# Patient Record
Sex: Female | Born: 1935 | Race: Black or African American | Hispanic: No | Smoking: Never smoker
Health system: Southern US, Community
[De-identification: ages and names within clinical notes are randomized; demographics above are authoritative.]

## PROBLEM LIST (undated history)

## (undated) DIAGNOSIS — G4700 Insomnia, unspecified: Secondary | ICD-10-CM

## (undated) DIAGNOSIS — M545 Low back pain, unspecified: Secondary | ICD-10-CM

## (undated) DIAGNOSIS — M797 Fibromyalgia: Secondary | ICD-10-CM

## (undated) DIAGNOSIS — R1310 Dysphagia, unspecified: Secondary | ICD-10-CM

## (undated) DIAGNOSIS — L280 Lichen simplex chronicus: Secondary | ICD-10-CM

## (undated) DIAGNOSIS — K5730 Diverticulosis of large intestine without perforation or abscess without bleeding: Secondary | ICD-10-CM

## (undated) DIAGNOSIS — R3589 Other polyuria: Secondary | ICD-10-CM

## (undated) DIAGNOSIS — R202 Paresthesia of skin: Secondary | ICD-10-CM

## (undated) DIAGNOSIS — E538 Deficiency of other specified B group vitamins: Secondary | ICD-10-CM

## (undated) DIAGNOSIS — R358 Other polyuria: Secondary | ICD-10-CM

## (undated) DIAGNOSIS — Z86010 Personal history of colonic polyps: Secondary | ICD-10-CM

## (undated) DIAGNOSIS — Z9889 Other specified postprocedural states: Secondary | ICD-10-CM

## (undated) DIAGNOSIS — K449 Diaphragmatic hernia without obstruction or gangrene: Secondary | ICD-10-CM

## (undated) DIAGNOSIS — K219 Gastro-esophageal reflux disease without esophagitis: Secondary | ICD-10-CM

## (undated) DIAGNOSIS — R634 Abnormal weight loss: Secondary | ICD-10-CM

## (undated) DIAGNOSIS — E785 Hyperlipidemia, unspecified: Secondary | ICD-10-CM

## (undated) DIAGNOSIS — I4510 Unspecified right bundle-branch block: Secondary | ICD-10-CM

## (undated) DIAGNOSIS — M810 Age-related osteoporosis without current pathological fracture: Secondary | ICD-10-CM

## (undated) DIAGNOSIS — I10 Essential (primary) hypertension: Secondary | ICD-10-CM

## (undated) DIAGNOSIS — Z8679 Personal history of other diseases of the circulatory system: Secondary | ICD-10-CM

## (undated) DIAGNOSIS — I517 Cardiomegaly: Secondary | ICD-10-CM

## (undated) DIAGNOSIS — R002 Palpitations: Secondary | ICD-10-CM

## (undated) DIAGNOSIS — K8020 Calculus of gallbladder without cholecystitis without obstruction: Secondary | ICD-10-CM

## (undated) DIAGNOSIS — F411 Generalized anxiety disorder: Secondary | ICD-10-CM

## (undated) DIAGNOSIS — M8580 Other specified disorders of bone density and structure, unspecified site: Secondary | ICD-10-CM

## (undated) DIAGNOSIS — K869 Disease of pancreas, unspecified: Secondary | ICD-10-CM

## (undated) DIAGNOSIS — M129 Arthropathy, unspecified: Secondary | ICD-10-CM

## (undated) HISTORY — DX: Low back pain, unspecified: M54.50

## (undated) HISTORY — DX: Diverticulosis of large intestine without perforation or abscess without bleeding: K57.30

## (undated) HISTORY — DX: Diaphragmatic hernia without obstruction or gangrene: K44.9

## (undated) HISTORY — DX: Fibromyalgia: M79.7

## (undated) HISTORY — DX: Personal history of colonic polyps: Z86.010

## (undated) HISTORY — DX: Essential (primary) hypertension: I10

## (undated) HISTORY — PX: CHOLECYSTECTOMY: SHX55

## (undated) HISTORY — DX: Dysphagia, unspecified: R13.10

## (undated) HISTORY — DX: Calculus of gallbladder without cholecystitis without obstruction: K80.20

## (undated) HISTORY — DX: Personal history of other diseases of the circulatory system: Z86.79

## (undated) HISTORY — DX: Paresthesia of skin: R20.2

## (undated) HISTORY — DX: Gastro-esophageal reflux disease without esophagitis: K21.9

## (undated) HISTORY — DX: Abnormal weight loss: R63.4

## (undated) HISTORY — DX: Lichen simplex chronicus: L28.0

## (undated) HISTORY — DX: Generalized anxiety disorder: F41.1

## (undated) HISTORY — DX: Other polyuria: R35.8

## (undated) HISTORY — PX: ABDOMINAL HYSTERECTOMY: SHX81

## (undated) HISTORY — PX: SPINE SURGERY: SHX786

## (undated) HISTORY — DX: Low back pain: M54.5

## (undated) HISTORY — DX: Other polyuria: R35.89

## (undated) HISTORY — PX: ESOPHAGOGASTRODUODENOSCOPY: SHX1529

## (undated) HISTORY — DX: Deficiency of other specified B group vitamins: E53.8

## (undated) HISTORY — DX: Cardiomegaly: I51.7

## (undated) HISTORY — PX: APPENDECTOMY: SHX54

## (undated) HISTORY — DX: Other specified disorders of bone density and structure, unspecified site: M85.80

## (undated) HISTORY — DX: Other specified postprocedural states: Z98.890

## (undated) HISTORY — DX: Unspecified right bundle-branch block: I45.10

## (undated) HISTORY — DX: Arthropathy, unspecified: M12.9

## (undated) HISTORY — PX: OOPHORECTOMY: SHX86

## (undated) HISTORY — DX: Hyperlipidemia, unspecified: E78.5

## (undated) HISTORY — DX: Disease of pancreas, unspecified: K86.9

## (undated) HISTORY — DX: Palpitations: R00.2

## (undated) HISTORY — DX: Age-related osteoporosis without current pathological fracture: M81.0

## (undated) HISTORY — DX: Insomnia, unspecified: G47.00

---

## 1998-03-27 ENCOUNTER — Other Ambulatory Visit: Admission: RE | Admit: 1998-03-27 | Discharge: 1998-03-27 | Payer: Self-pay | Admitting: Obstetrics and Gynecology

## 1999-05-10 ENCOUNTER — Other Ambulatory Visit: Admission: RE | Admit: 1999-05-10 | Discharge: 1999-05-10 | Payer: Self-pay | Admitting: Obstetrics and Gynecology

## 1999-10-17 ENCOUNTER — Encounter: Payer: Self-pay | Admitting: Obstetrics and Gynecology

## 1999-10-17 ENCOUNTER — Encounter: Admission: RE | Admit: 1999-10-17 | Discharge: 1999-10-17 | Payer: Self-pay | Admitting: General Surgery

## 2000-06-02 ENCOUNTER — Other Ambulatory Visit: Admission: RE | Admit: 2000-06-02 | Discharge: 2000-06-02 | Payer: Self-pay | Admitting: Obstetrics and Gynecology

## 2000-10-20 ENCOUNTER — Encounter: Admission: RE | Admit: 2000-10-20 | Discharge: 2000-10-20 | Payer: Self-pay | Admitting: Obstetrics and Gynecology

## 2000-10-20 ENCOUNTER — Encounter: Payer: Self-pay | Admitting: Obstetrics and Gynecology

## 2001-05-20 ENCOUNTER — Encounter: Payer: Self-pay | Admitting: Otolaryngology

## 2001-05-20 ENCOUNTER — Encounter: Admission: RE | Admit: 2001-05-20 | Discharge: 2001-05-20 | Payer: Self-pay | Admitting: Otolaryngology

## 2001-06-28 ENCOUNTER — Other Ambulatory Visit: Admission: RE | Admit: 2001-06-28 | Discharge: 2001-06-28 | Payer: Self-pay | Admitting: Obstetrics and Gynecology

## 2001-11-05 ENCOUNTER — Encounter: Payer: Self-pay | Admitting: Obstetrics and Gynecology

## 2001-11-05 ENCOUNTER — Encounter: Admission: RE | Admit: 2001-11-05 | Discharge: 2001-11-05 | Payer: Self-pay | Admitting: Obstetrics and Gynecology

## 2002-11-22 ENCOUNTER — Encounter: Payer: Self-pay | Admitting: Gastroenterology

## 2002-11-22 ENCOUNTER — Ambulatory Visit (HOSPITAL_COMMUNITY): Admission: RE | Admit: 2002-11-22 | Discharge: 2002-11-22 | Payer: Self-pay | Admitting: Gastroenterology

## 2002-12-09 ENCOUNTER — Encounter: Admission: RE | Admit: 2002-12-09 | Discharge: 2002-12-09 | Payer: Self-pay | Admitting: Internal Medicine

## 2002-12-09 ENCOUNTER — Encounter: Payer: Self-pay | Admitting: Internal Medicine

## 2004-01-04 ENCOUNTER — Encounter: Admission: RE | Admit: 2004-01-04 | Discharge: 2004-01-04 | Payer: Self-pay | Admitting: Internal Medicine

## 2004-12-17 ENCOUNTER — Ambulatory Visit: Payer: Self-pay | Admitting: Internal Medicine

## 2005-01-29 ENCOUNTER — Ambulatory Visit: Payer: Self-pay | Admitting: Internal Medicine

## 2005-02-04 ENCOUNTER — Encounter: Admission: RE | Admit: 2005-02-04 | Discharge: 2005-02-04 | Payer: Self-pay | Admitting: Internal Medicine

## 2005-02-04 ENCOUNTER — Other Ambulatory Visit: Admission: RE | Admit: 2005-02-04 | Discharge: 2005-02-04 | Payer: Self-pay | Admitting: Gynecology

## 2005-02-25 ENCOUNTER — Ambulatory Visit: Payer: Self-pay | Admitting: Internal Medicine

## 2005-04-28 ENCOUNTER — Ambulatory Visit: Payer: Self-pay | Admitting: Internal Medicine

## 2005-05-22 ENCOUNTER — Emergency Department (HOSPITAL_COMMUNITY): Admission: EM | Admit: 2005-05-22 | Discharge: 2005-05-23 | Payer: Self-pay | Admitting: *Deleted

## 2005-05-29 ENCOUNTER — Ambulatory Visit: Payer: Self-pay | Admitting: Internal Medicine

## 2005-06-24 ENCOUNTER — Inpatient Hospital Stay (HOSPITAL_COMMUNITY): Admission: AD | Admit: 2005-06-24 | Discharge: 2005-06-27 | Payer: Self-pay | Admitting: Internal Medicine

## 2005-06-24 ENCOUNTER — Ambulatory Visit: Payer: Self-pay | Admitting: Internal Medicine

## 2005-06-26 ENCOUNTER — Encounter (INDEPENDENT_AMBULATORY_CARE_PROVIDER_SITE_OTHER): Payer: Self-pay | Admitting: Specialist

## 2005-07-07 ENCOUNTER — Ambulatory Visit: Payer: Self-pay | Admitting: Internal Medicine

## 2005-10-07 ENCOUNTER — Ambulatory Visit: Payer: Self-pay | Admitting: Internal Medicine

## 2006-01-19 ENCOUNTER — Ambulatory Visit: Payer: Self-pay | Admitting: Internal Medicine

## 2006-02-06 ENCOUNTER — Encounter: Admission: RE | Admit: 2006-02-06 | Discharge: 2006-02-06 | Payer: Self-pay | Admitting: Gynecology

## 2006-03-02 ENCOUNTER — Encounter: Admission: RE | Admit: 2006-03-02 | Discharge: 2006-03-02 | Payer: Self-pay | Admitting: Gynecology

## 2006-04-20 ENCOUNTER — Ambulatory Visit: Payer: Self-pay | Admitting: Internal Medicine

## 2006-05-29 ENCOUNTER — Ambulatory Visit: Payer: Self-pay | Admitting: Internal Medicine

## 2006-07-29 ENCOUNTER — Ambulatory Visit: Payer: Self-pay | Admitting: Internal Medicine

## 2006-08-03 ENCOUNTER — Ambulatory Visit: Payer: Self-pay | Admitting: Gastroenterology

## 2006-08-04 ENCOUNTER — Ambulatory Visit: Payer: Self-pay | Admitting: Gastroenterology

## 2006-10-13 ENCOUNTER — Ambulatory Visit: Payer: Self-pay | Admitting: Internal Medicine

## 2006-10-13 LAB — CONVERTED CEMR LAB
Basophils Absolute: 0 10*3/uL (ref 0.0–0.1)
Eosinophil percent: 8.5 % — ABNORMAL HIGH (ref 0.0–5.0)
Folate: 13.8 ng/mL
Glucose, Bld: 121 mg/dL — ABNORMAL HIGH (ref 70–99)
Lymphocytes Relative: 24.6 % (ref 12.0–46.0)
MCHC: 33.6 g/dL (ref 30.0–36.0)
Neutro Abs: 3.6 10*3/uL (ref 1.4–7.7)
Neutrophils Relative %: 57.3 % (ref 43.0–77.0)

## 2007-01-11 ENCOUNTER — Ambulatory Visit: Payer: Self-pay | Admitting: Internal Medicine

## 2007-01-11 LAB — CONVERTED CEMR LAB
ALT: 27 units/L (ref 0–40)
AST: 23 units/L (ref 0–37)
Albumin: 3.6 g/dL (ref 3.5–5.2)
Alkaline Phosphatase: 124 units/L — ABNORMAL HIGH (ref 39–117)
Bilirubin, Direct: 0.2 mg/dL (ref 0.0–0.3)
Chol/HDL Ratio, serum: 2.6
Cholesterol: 176 mg/dL (ref 0–200)
Glucose, Bld: 97 mg/dL (ref 70–99)
HDL: 66.9 mg/dL (ref >39.0)
Hgb A1c MFr Bld: 6.3 % — ABNORMAL HIGH (ref 4.6–6.0)
LDL Cholesterol: 96 mg/dL (ref 0–99)
Total Bilirubin: 0.9 mg/dL (ref 0.3–1.2)
Total Protein: 6.6 g/dL (ref 6.0–8.3)
Triglyceride fasting, serum: 66 mg/dL (ref 0–149)
VLDL: 13 mg/dL (ref 0–40)

## 2007-02-23 ENCOUNTER — Encounter: Admission: RE | Admit: 2007-02-23 | Discharge: 2007-02-23 | Payer: Self-pay | Admitting: Gynecology

## 2007-02-23 ENCOUNTER — Other Ambulatory Visit: Admission: RE | Admit: 2007-02-23 | Discharge: 2007-02-23 | Payer: Self-pay | Admitting: Gynecology

## 2007-03-03 ENCOUNTER — Encounter: Admission: RE | Admit: 2007-03-03 | Discharge: 2007-03-03 | Payer: Self-pay | Admitting: Gynecology

## 2007-04-12 ENCOUNTER — Ambulatory Visit: Payer: Self-pay | Admitting: Internal Medicine

## 2007-06-25 ENCOUNTER — Ambulatory Visit: Payer: Self-pay | Admitting: Internal Medicine

## 2007-06-25 LAB — CONVERTED CEMR LAB
Basophils Relative: 1 % (ref 0–1)
HCT: 41.6 % (ref 36.0–46.0)
Hemoglobin: 13.9 g/dL (ref 12.0–15.0)
MCHC: 33.4 g/dL (ref 30.0–36.0)
MCV: 86.3 fL (ref 78.0–100.0)
Monocytes Absolute: 0.6 10*3/uL (ref 0.2–0.7)
RDW: 14.7 % — ABNORMAL HIGH (ref 11.5–14.0)
T3, Free: 2.9 pg/mL (ref 2.3–4.2)

## 2007-08-10 DIAGNOSIS — M899 Disorder of bone, unspecified: Secondary | ICD-10-CM | POA: Insufficient documentation

## 2007-08-10 DIAGNOSIS — IMO0001 Reserved for inherently not codable concepts without codable children: Secondary | ICD-10-CM

## 2007-08-10 DIAGNOSIS — J45909 Unspecified asthma, uncomplicated: Secondary | ICD-10-CM

## 2007-08-10 DIAGNOSIS — M949 Disorder of cartilage, unspecified: Secondary | ICD-10-CM

## 2007-09-02 ENCOUNTER — Encounter: Admission: RE | Admit: 2007-09-02 | Discharge: 2007-09-02 | Payer: Self-pay | Admitting: Gynecology

## 2007-09-02 ENCOUNTER — Ambulatory Visit: Payer: Self-pay | Admitting: Internal Medicine

## 2007-09-02 DIAGNOSIS — E538 Deficiency of other specified B group vitamins: Secondary | ICD-10-CM

## 2007-09-02 DIAGNOSIS — K219 Gastro-esophageal reflux disease without esophagitis: Secondary | ICD-10-CM

## 2007-09-02 HISTORY — DX: Deficiency of other specified B group vitamins: E53.8

## 2007-09-02 HISTORY — DX: Gastro-esophageal reflux disease without esophagitis: K21.9

## 2007-10-18 ENCOUNTER — Telehealth (INDEPENDENT_AMBULATORY_CARE_PROVIDER_SITE_OTHER): Payer: Self-pay | Admitting: *Deleted

## 2007-12-09 ENCOUNTER — Ambulatory Visit: Payer: Self-pay | Admitting: Internal Medicine

## 2007-12-09 DIAGNOSIS — T887XXA Unspecified adverse effect of drug or medicament, initial encounter: Secondary | ICD-10-CM

## 2007-12-09 DIAGNOSIS — E785 Hyperlipidemia, unspecified: Secondary | ICD-10-CM

## 2007-12-09 DIAGNOSIS — M545 Low back pain: Secondary | ICD-10-CM

## 2007-12-09 HISTORY — DX: Hyperlipidemia, unspecified: E78.5

## 2007-12-09 LAB — CONVERTED CEMR LAB
AST: 23 units/L (ref 0–37)
Cholesterol: 169 mg/dL (ref 0–200)
HDL: 58 mg/dL (ref >39.0)
LDL Cholesterol: 103 mg/dL — ABNORMAL HIGH (ref 0–99)
Total Protein: 6.6 g/dL (ref 6.0–8.3)
Triglycerides: 42 mg/dL (ref 0–149)
Vit D, 1,25-Dihydroxy: 32 (ref 30–89)

## 2008-03-03 ENCOUNTER — Ambulatory Visit: Payer: Self-pay | Admitting: Internal Medicine

## 2008-03-03 LAB — CONVERTED CEMR LAB
Cholesterol, target level: 200 mg/dL
HDL goal, serum: 40 mg/dL
LDL Goal: 160 mg/dL

## 2008-03-08 ENCOUNTER — Encounter: Payer: Self-pay | Admitting: Internal Medicine

## 2008-03-08 ENCOUNTER — Encounter: Admission: RE | Admit: 2008-03-08 | Discharge: 2008-03-08 | Payer: Self-pay | Admitting: Gynecology

## 2008-06-02 ENCOUNTER — Ambulatory Visit: Payer: Self-pay | Admitting: Internal Medicine

## 2008-06-02 DIAGNOSIS — R634 Abnormal weight loss: Secondary | ICD-10-CM

## 2008-06-02 DIAGNOSIS — Z860100 Personal history of colon polyps, unspecified: Secondary | ICD-10-CM | POA: Insufficient documentation

## 2008-06-02 HISTORY — DX: Personal history of colon polyps, unspecified: Z86.0100

## 2008-06-02 HISTORY — DX: Abnormal weight loss: R63.4

## 2008-06-02 HISTORY — DX: Personal history of colonic polyps: Z86.010

## 2008-06-02 LAB — CONVERTED CEMR LAB
CEA: 1.1 ng/mL (ref 0.0–5.0)
Chloride: 100 meq/L (ref 96–112)
Potassium: 3.7 meq/L (ref 3.5–5.1)
Sodium: 137 meq/L (ref 135–145)
TSH: 0.72 microintl units/mL (ref 0.35–5.50)

## 2008-08-30 ENCOUNTER — Ambulatory Visit: Payer: Self-pay | Admitting: Internal Medicine

## 2008-08-30 DIAGNOSIS — F411 Generalized anxiety disorder: Secondary | ICD-10-CM | POA: Insufficient documentation

## 2008-09-06 ENCOUNTER — Ambulatory Visit: Payer: Self-pay | Admitting: Internal Medicine

## 2008-09-19 ENCOUNTER — Encounter: Payer: Self-pay | Admitting: Internal Medicine

## 2008-09-19 ENCOUNTER — Ambulatory Visit: Payer: Self-pay | Admitting: Internal Medicine

## 2008-09-22 ENCOUNTER — Encounter: Payer: Self-pay | Admitting: Internal Medicine

## 2008-09-30 ENCOUNTER — Ambulatory Visit: Payer: Self-pay | Admitting: Internal Medicine

## 2008-10-30 ENCOUNTER — Ambulatory Visit: Payer: Self-pay | Admitting: Internal Medicine

## 2009-01-22 ENCOUNTER — Ambulatory Visit: Payer: Self-pay | Admitting: Internal Medicine

## 2009-01-22 LAB — CONVERTED CEMR LAB
ALT: 22 units/L (ref 0–35)
AST: 20 units/L (ref 0–37)
Alkaline Phosphatase: 111 units/L (ref 39–117)
Basophils Absolute: 0 10*3/uL (ref 0.0–0.1)
Basophils Relative: 0.5 % (ref 0.0–3.0)
Bilirubin, Direct: 0.1 mg/dL (ref 0.0–0.3)
Chloride: 110 meq/L (ref 96–112)
Cholesterol: 156 mg/dL (ref 0–200)
Eosinophils Absolute: 0.4 10*3/uL (ref 0.0–0.7)
Eosinophils Relative: 8.5 % — ABNORMAL HIGH (ref 0.0–5.0)
GFR calc Af Amer: 156 mL/min
HCT: 38.1 % (ref 36.0–46.0)
HDL: 66.3 mg/dL (ref >39.0)
Hemoglobin: 13 g/dL (ref 12.0–15.0)
MCHC: 34 g/dL (ref 30.0–36.0)
Monocytes Absolute: 0.5 10*3/uL (ref 0.1–1.0)
Monocytes Relative: 11.2 % (ref 3.0–12.0)
Neutro Abs: 2 10*3/uL (ref 1.4–7.7)
Potassium: 4.3 meq/L (ref 3.5–5.1)
Sodium: 142 meq/L (ref 135–145)
Triglycerides: 38 mg/dL (ref 0–149)

## 2009-02-08 ENCOUNTER — Ambulatory Visit: Payer: Self-pay | Admitting: Internal Medicine

## 2009-03-15 ENCOUNTER — Encounter: Admission: RE | Admit: 2009-03-15 | Discharge: 2009-03-15 | Payer: Self-pay | Admitting: Gynecology

## 2009-03-15 ENCOUNTER — Encounter: Payer: Self-pay | Admitting: Internal Medicine

## 2009-05-17 ENCOUNTER — Ambulatory Visit: Payer: Self-pay | Admitting: Internal Medicine

## 2009-05-17 DIAGNOSIS — M810 Age-related osteoporosis without current pathological fracture: Secondary | ICD-10-CM | POA: Insufficient documentation

## 2009-05-17 HISTORY — DX: Age-related osteoporosis without current pathological fracture: M81.0

## 2009-09-13 ENCOUNTER — Ambulatory Visit: Payer: Self-pay | Admitting: Internal Medicine

## 2009-09-13 LAB — CONVERTED CEMR LAB
Albumin: 3.5 g/dL (ref 3.5–5.2)
Alkaline Phosphatase: 115 units/L (ref 39–117)
Cholesterol: 145 mg/dL (ref 0–200)
HDL: 55.8 mg/dL (ref >39.00)
TSH: 1.69 microintl units/mL (ref 0.35–5.50)
Total Bilirubin: 0.9 mg/dL (ref 0.3–1.2)
VLDL: 9 mg/dL (ref 0.0–40.0)

## 2009-09-27 ENCOUNTER — Ambulatory Visit: Payer: Self-pay | Admitting: Internal Medicine

## 2009-10-05 ENCOUNTER — Encounter: Admission: RE | Admit: 2009-10-05 | Discharge: 2009-10-05 | Payer: Self-pay | Admitting: Podiatry

## 2010-01-17 ENCOUNTER — Ambulatory Visit: Payer: Self-pay | Admitting: Internal Medicine

## 2010-01-17 DIAGNOSIS — L280 Lichen simplex chronicus: Secondary | ICD-10-CM

## 2010-03-19 ENCOUNTER — Encounter: Admission: RE | Admit: 2010-03-19 | Discharge: 2010-03-19 | Payer: Self-pay | Admitting: Gynecology

## 2010-04-18 ENCOUNTER — Ambulatory Visit: Payer: Self-pay | Admitting: Internal Medicine

## 2010-04-18 LAB — CONVERTED CEMR LAB
TSH: 1.53 microintl units/mL (ref 0.35–5.50)
Vit D, 25-Hydroxy: 49 ng/mL (ref 30–89)
Vitamin B-12: >1500 pg/mL — ABNORMAL HIGH (ref 211–911)

## 2010-08-12 ENCOUNTER — Ambulatory Visit: Payer: Self-pay | Admitting: Family Medicine

## 2010-08-12 DIAGNOSIS — R319 Hematuria, unspecified: Secondary | ICD-10-CM

## 2010-08-12 DIAGNOSIS — G4700 Insomnia, unspecified: Secondary | ICD-10-CM

## 2010-08-12 DIAGNOSIS — I517 Cardiomegaly: Secondary | ICD-10-CM

## 2010-08-12 DIAGNOSIS — R358 Other polyuria: Secondary | ICD-10-CM

## 2010-08-12 DIAGNOSIS — R209 Unspecified disturbances of skin sensation: Secondary | ICD-10-CM

## 2010-08-12 DIAGNOSIS — Z8679 Personal history of other diseases of the circulatory system: Secondary | ICD-10-CM | POA: Insufficient documentation

## 2010-08-12 HISTORY — DX: Personal history of other diseases of the circulatory system: Z86.79

## 2010-08-12 HISTORY — DX: Cardiomegaly: I51.7

## 2010-08-13 ENCOUNTER — Encounter: Payer: Self-pay | Admitting: Internal Medicine

## 2010-08-13 LAB — CONVERTED CEMR LAB
ALT: 18 units/L (ref 0–35)
Basophils Relative: 0.9 % (ref 0.0–3.0)
Bilirubin, Direct: 0.1 mg/dL (ref 0.0–0.3)
CO2: 27 meq/L (ref 19–32)
Calcium: 8.9 mg/dL (ref 8.4–10.5)
Chloride: 105 meq/L (ref 96–112)
Creatinine, Ser: 0.6 mg/dL (ref 0.4–1.2)
Lymphocytes Relative: 27.3 % (ref 12.0–46.0)
Lymphs Abs: 1.3 10*3/uL (ref 0.7–4.0)
Monocytes Absolute: 0.5 10*3/uL (ref 0.1–1.0)
Neutro Abs: 2.5 10*3/uL (ref 1.4–7.7)
Neutrophils Relative %: 51.5 % (ref 43.0–77.0)
Phosphorus: 2.7 mg/dL (ref 2.3–4.6)
Platelets: 296 10*3/uL (ref 150.0–400.0)
RDW: 14.2 % (ref 11.5–14.6)
TSH: 0.97 microintl units/mL (ref 0.35–5.50)

## 2010-09-03 DIAGNOSIS — M129 Arthropathy, unspecified: Secondary | ICD-10-CM

## 2010-09-03 DIAGNOSIS — K869 Disease of pancreas, unspecified: Secondary | ICD-10-CM | POA: Insufficient documentation

## 2010-09-03 DIAGNOSIS — R1310 Dysphagia, unspecified: Secondary | ICD-10-CM

## 2010-09-03 DIAGNOSIS — R002 Palpitations: Secondary | ICD-10-CM

## 2010-09-03 DIAGNOSIS — K5730 Diverticulosis of large intestine without perforation or abscess without bleeding: Secondary | ICD-10-CM

## 2010-09-03 DIAGNOSIS — K8020 Calculus of gallbladder without cholecystitis without obstruction: Secondary | ICD-10-CM | POA: Insufficient documentation

## 2010-09-03 HISTORY — DX: Diverticulosis of large intestine without perforation or abscess without bleeding: K57.30

## 2010-09-03 HISTORY — DX: Arthropathy, unspecified: M12.9

## 2010-09-03 HISTORY — DX: Disease of pancreas, unspecified: K86.9

## 2010-09-03 HISTORY — DX: Dysphagia, unspecified: R13.10

## 2010-09-03 HISTORY — DX: Palpitations: R00.2

## 2010-09-03 HISTORY — DX: Calculus of gallbladder without cholecystitis without obstruction: K80.20

## 2010-09-05 ENCOUNTER — Ambulatory Visit: Payer: Self-pay | Admitting: Cardiovascular Disease

## 2010-09-05 DIAGNOSIS — I4510 Unspecified right bundle-branch block: Secondary | ICD-10-CM

## 2010-09-05 HISTORY — DX: Unspecified right bundle-branch block: I45.10

## 2010-09-10 ENCOUNTER — Telehealth (INDEPENDENT_AMBULATORY_CARE_PROVIDER_SITE_OTHER): Payer: Self-pay | Admitting: *Deleted

## 2010-09-23 ENCOUNTER — Ambulatory Visit (HOSPITAL_COMMUNITY): Admission: RE | Admit: 2010-09-23 | Discharge: 2010-09-23 | Payer: Self-pay | Admitting: Cardiovascular Disease

## 2010-09-23 ENCOUNTER — Ambulatory Visit: Payer: Self-pay | Admitting: Cardiovascular Disease

## 2010-09-23 ENCOUNTER — Ambulatory Visit: Payer: Self-pay | Admitting: Internal Medicine

## 2010-09-23 ENCOUNTER — Encounter: Payer: Self-pay | Admitting: Cardiovascular Disease

## 2010-09-23 ENCOUNTER — Ambulatory Visit: Payer: Self-pay

## 2010-09-25 ENCOUNTER — Ambulatory Visit: Payer: Self-pay | Admitting: Internal Medicine

## 2010-10-07 ENCOUNTER — Telehealth (INDEPENDENT_AMBULATORY_CARE_PROVIDER_SITE_OTHER): Payer: Self-pay | Admitting: *Deleted

## 2010-10-08 ENCOUNTER — Telehealth: Payer: Self-pay | Admitting: Cardiovascular Disease

## 2010-10-14 ENCOUNTER — Encounter: Payer: Self-pay | Admitting: Cardiovascular Disease

## 2010-10-14 ENCOUNTER — Telehealth: Payer: Self-pay | Admitting: Cardiovascular Disease

## 2010-10-29 ENCOUNTER — Ambulatory Visit: Payer: Self-pay | Admitting: Internal Medicine

## 2010-10-29 DIAGNOSIS — I10 Essential (primary) hypertension: Secondary | ICD-10-CM | POA: Insufficient documentation

## 2010-10-29 HISTORY — DX: Essential (primary) hypertension: I10

## 2010-11-04 ENCOUNTER — Ambulatory Visit: Payer: Self-pay | Admitting: Internal Medicine

## 2011-01-07 ENCOUNTER — Encounter: Payer: Self-pay | Admitting: *Deleted

## 2011-01-28 NOTE — Progress Notes (Signed)
Summary: appointments  Phone Note Other Incoming   Summary of Call: Received call from pt who states she is returning call to office.  Pt reports she spoke with Harriett Sine earlier today regarding new medication and she has no questions about that.  Thinks call may have been about appointment with Dr. Johney Frame. I let pt know appt with Dr. Johney Frame is scheduled for November 7,2011 at 4:00. Pt asking if she needs to keep previously scheduled appointment with Dr.Nishan for October 28, 2010. I checked with Stanton Kidney who said pt does not need to keep this appoinment unless she has additonal questions for Dr. Eden Emms.  Pt without additonal questions and would like to cancel appointment with Dr.Nishan.  Appointment for October 28, 2010 canceled.

## 2011-01-28 NOTE — Miscellaneous (Signed)
  Clinical Lists Changes  Medications: Added new medication of LOPRESSOR 50 MG TABS (METOPROLOL TARTRATE) take 1/2 tablet twice per day - Signed Rx of LOPRESSOR 50 MG TABS (METOPROLOL TARTRATE) take 1/2 tablet twice per day;  #30 x 6;  Signed;  Entered by: Ledon Snare, RN;  Authorized by: Colon Branch, MD, [facility];  Method used: Electronically to [facility] [address] [place], [place], [place]  [address], Ph: [phone], Fax: [phone]    Prescriptions: LOPRESSOR 50 MG TABS (METOPROLOL TARTRATE) take 1/2 tablet twice per day  #30 x 6   Entered by:   Ledon Snare, RN   Authorized by:   Colon Branch, MD, [facility]   Signed by:   Ledon Snare, RN on 10/14/2010   Method used:   Electronically to        [facility]  [address] (retail)       [address]       [place], [place]  [address]       Ph: [phone]       Fax: [phone]   RxID:   [id number]

## 2011-01-28 NOTE — Assessment & Plan Note (Signed)
Summary: EP REFERRAL FOR SINUS TACHCARDIA PER LIFE WATCH MONITOR   Visit Type:  Initial Consult Referring Provider:  Dr Eden Emms Primary Provider:  Stacie Glaze MD   History of Present Illness: [name] is a pleasant 75 yo AAF with a h/o palpitations who presents for EP consultation.  She reports episodes abrupt onset and termination of tachypalpitations.  These episodes first began 9/11.  These episodes would last less than several minutes.  She reports that she would take xanax and tachycardia would resolve.  She is unaware of triggers/ precipitants for episodes.  She denies associated CP, SOB, dizziness, presyncope, or syncope.  She was evaluated by Dr Eden Emms and had an event monitor obtained.  This revealed a long RP tachycardia, likely sinus tachycardia.  She was placed on lopressor 25mg  two times a day.  She states that her symptoms have significantly improved with lopressor.  She does not wish for change in therapy at this time.  Current Medications (verified): 1)  Qvar 80 Mcg/act Aers (Beclomethasone Dipropionate) .... Inhale 2 Puff At Bedtime 2)  Magnesium Gluconate 250 Mg  Tabs (Magnesium Gluconate) .... Once Daily 3)  Rhinocort Aqua 32 Mcg/act  Susp (Budesonide (Nasal)) .... Once Daily As Needed 4)  Protonix 40 Mg  Tbec (Pantoprazole Sodium) .... Once Daily As Needed 5)  Mobic 7.5 Mg  Tabs (Meloxicam) .... Once Daily 6)  Caltrate 600+d Plus 600-400 Mg-Unit Tabs (Calcium Carbonate-Vit D-Min) .... One By Mouth Bid 7)  Alprazolam 1 Mg Tabs (Alprazolam) .... One By Mouth Two Times A Day 8)  Premarin 0.625 Mg/gm Crea (Estrogens, Conjugated) 9)  Lopressor 50 Mg Tabs (Metoprolol Tartrate) .... Take 1/2 Tablet Twice Per Day  Allergies: 1)  ! Celebrex (Celecoxib)  Past History:  Past Medical History: Tachypalpitations- long RP tachycardia on event monitor, either sinus tachycardia vs atrial tachycardia HYPERLIPIDEMIA  DIVERTICULAR DISEASE  CHOLELITHIASIS  GALLSTONE PANCREATITIS    ARTHRITIS  DYSPHAGIA UNSPECIFIED  HEMATURIA UNSPECIFIED  ENCOUNTER FOR LONG-TERM USE OF OTHER MEDICATIONS POLYURIA  VENTRICULAR HYPERTROPHY, LEFT  INSOMNIA  PARESTHESIA  LICHEN SIMPLEX CHRONICUS  OSTEOPOROSIS  ANXIETY STATE, UNSPECIFIED  WEIGHT LOSS, ABNORMAL  COLONIC POLYPS, HX OF  UNS ADVRS EFF UNS RX MEDICINAL&BIOLOGICAL SBSTNC  LOW BACK PAIN  VITAMIN B12 DEFICIENCY  GERD  FIBROMYALGIA  OSTEOPENIA  ASTHMA   Past Surgical History: Reviewed history from 09/03/2010 and no changes required. EGD 2007 with HH and esophagitis Cholecystectomy Hysterectomy Oophorectomy Neck surgery, especially cervical neck. appendectomy  Family History: Reviewed history from 09/03/2010 and no changes required.   Dad with a history of prostate cancer.  Mom died in her 8s  secondary to stroke and myocardial infarction.  Social History: Widowed x 11 years.   Never Smoked  She is retired.  She is a widow.  He has six children and lives in [place], [place].  She goes to [employer].  Review of Systems       All systems are reviewed and negative except as listed in the HPI.   Vital Signs:  Patient profile:   75 year old female Height:      65 inches Weight:      146 pounds BMI:     24.38 Pulse rate:   67 / minute BP sitting:   140 / 70  (left arm)  Vitals Entered By: Laurance Flatten CMA (November 04, 2010 4:22 PM)  Physical Exam  General:  Affect appropriate Healthy:  appears stated age HEENT: normal Neck  supple with no adenopathy JVP normal no bruits no thyromegaly Lungs clear with no wheezing and good diaphragmatic motion Heart:  RRR no murmur,rub, gallop or click PMI normal Abdomen: benighn, BS positve, no tenderness, no AAA no bruit.  No HSM or HJR Distal pulses intact with no bruits No edema Neuro non-focal Skin warm and dry     Echocardiogram  Procedure date:  09/23/2010  Findings:        Study Conclusions    - Left ventricle: The  cavity size was normal. Wall thickness was     increased in a pattern of mild LVH. Systolic function was     vigorous. The estimated ejection fraction was in the range of 65%     to 70%. Wall motion was normal; there were no regional wall motion     abnormalities. Doppler parameters are consistent with abnormal     left ventricular relaxation (grade 1 diastolic dysfunction).   - Tricuspid valve: Increased thickening and prominence of tricuspid     annulus.   - Pulmonary arteries: PA peak pressure: 36mm Hg (S).   Transthoracic echocardiography. M-mode, complete 2D, spectral   Doppler, and color Doppler. Height: Height: 165.1cm. Height: 65in.   Weight: Weight: 66.7kg. Weight: 146.7lb. Body mass index: BMI:   24.5kg/m^2. Body surface area: BSA: 1.33m^2. Blood pressure: 150/70.   Patient status: Outpatient. Location: [facility]   --------------------------------------------------------------------   Prepared and Electronically Authenticated by    Dietrich Pates, MD     EKG  Procedure date:  11/04/2010  Findings:      sinus rhythm 67 bpm, PR 152, RBB, LAHB, otherwise normal ekg  Impression & Recommendations:  Problem # 1:  PALPITATIONS (ICD-785.1) The patient has abrupt onset and termination of tachypalpitations.  I have reviewed her recent event monitor which documents a long RP tachycardia.  The majority of these episodes appear to be sinus tachycardia, however, several episodes appear to be quite fast and abrupt and are therefore suggestive of possible atrial tachycardia.  At this point, she feels that her symptoms are well controlled with low dose metoprolol.  She has no interest in ablation. I think that we should continue on the path of beta blocker therapy and lifestyle modification to minimize stress as this is the most benign treatment course.  If her episodes worsen, then I would titrate her beta blocker further. I would not recommend antiarrhythmic medicine or ablation at  this time.  Problem # 2:  ESSENTIAL HYPERTENSION (ICD-401.9) stable no changes Her updated medication list for this problem includes:    Lopressor 50 Mg Tabs (Metoprolol tartrate) .Marland Kitchen... Take 1/2 tablet twice per day  Problem # 3:  RBBB (ICD-426.4) stable and tolerating metoprolol echo reviewed  Patient Instructions: 1)  Your physician wants you to follow-up in: 6 months with Dr Johney Frame  Bonita Quin will receive a reminder letter in the mail two months in advance. If you don't receive a letter, please call our office to schedule the follow-up appointment. 2)  Your physician recommends that you continue on your current medications as directed. Please refer to the Current Medication list given to you today.

## 2011-01-28 NOTE — Assessment & Plan Note (Signed)
Summary: 4 MONTH ROV/NJR   Vital Signs:  Patient profile:   75 year old female Height:      65 inches Weight:      152 pounds BMI:     25.39 Temp:     98.3 degrees F oral Pulse rate:   80 / minute Resp:     14 per minute BP sitting:   140 / 80  (left arm)  Vitals Entered By: Willy Eddy, LPN (January 17, 2010 10:11 AM) CC: roa   CC:  roa.  History of Present Illness: pt feels well has developed a contact dermatitis but off cymbalta her mood is better the xanax helps her anxiety and she is able to get some sleep she had a fall on her hand ( left and has swelling) she is conserned for a fracture GERD is stable on protonix asthma is stable nees sample of meds  Preventive Screening-Counseling & Management  Alcohol-Tobacco     Smoking Status: never     Passive Smoke Exposure: no  Problems Prior to Update: 1)  Osteoporosis  (ICD-733.00) 2)  Anxiety State, Unspecified  (ICD-300.00) 3)  Weight Loss, Abnormal  (ICD-783.21) 4)  Colonic Polyps, Hx of  (ICD-V12.72) 5)  Hyperlipidemia  (ICD-272.4) 6)  Uns Advrs Eff Uns Rx Medicinal&biological Sbstnc  (ICD-995.20) 7)  Low Back Pain  (ICD-724.2) 8)  Vitamin B12 Deficiency  (ICD-266.2) 9)  Gerd  (ICD-530.81) 10)  Fibromyalgia  (ICD-729.1) 11)  Osteopenia  (ICD-733.90) 12)  Asthma  (ICD-493.90)  Medications Prior to Update: 1)  Qvar 80 Mcg/act Aers (Beclomethasone Dipropionate) .... Inhale 2 Puff As Directed Twice A Day 2)  Clonazepam 0.5 Mg Tabs (Clonazepam) .... One By Mouth  Two Times A Day Daily 3)  Magnesium Gluconate 250 Mg  Tabs (Magnesium Gluconate) .... Once Daily 4)  Rhinocort Aqua 32 Mcg/act  Susp (Budesonide (Nasal)) .... Once Daily 5)  Protonix 40 Mg  Tbec (Pantoprazole Sodium) .... Once Daily 6)  Mobic 7.5 Mg  Tabs (Meloxicam) .... Once Daily 7)  Cymbalta 30 Mg  Cpep (Duloxetine Hcl) .... Take 1 Capsule By Mouth Once A Day 8)  Ultram 50 Mg  Tabs (Tramadol Hcl) .... One By Mouth  Every 6 Hours As Needed  Pain 9)  Caltrate 600+d Plus 600-400 Mg-Unit Tabs (Calcium Carbonate-Vit D-Min) .... One By Mouth Bid  Current Medications (verified): 1)  Qvar 80 Mcg/act Aers (Beclomethasone Dipropionate) .... Inhale 2 Puff As Directed Twice A Day 2)  Magnesium Gluconate 250 Mg  Tabs (Magnesium Gluconate) .... Once Daily 3)  Rhinocort Aqua 32 Mcg/act  Susp (Budesonide (Nasal)) .... Once Daily 4)  Protonix 40 Mg  Tbec (Pantoprazole Sodium) .... Once Daily 5)  Mobic 7.5 Mg  Tabs (Meloxicam) .... Once Daily 6)  Ultram 50 Mg  Tabs (Tramadol Hcl) .... One By Mouth  Every 6 Hours As Needed Pain 7)  Caltrate 600+d Plus 600-400 Mg-Unit Tabs (Calcium Carbonate-Vit D-Min) .... One By Mouth Bid 8)  Alprazolam 0.5 Mg Tabs (Alprazolam) .... One By Mouth Q Hs 9)  Amcinonide 0.1 % Crea (Amcinonide) .... Apply To Palm Daily As Needed For Rash  Allergies (verified): 1)  ! Celebrex (Celecoxib)  Past History:  Family History: Last updated: 09/02/2007 longevity  Social History: Last updated: 09/02/2007 Single Never Smoked  Risk Factors: Smoking Status: never (01/17/2010) Passive Smoke Exposure: no (01/17/2010)  Past medical, surgical, family and social histories (including risk factors) reviewed, and no changes noted (except as noted below).  Past Medical  History: Reviewed history from 06/02/2008 and no changes required. Asthma Osteopenia fibromyalgia GERD Low back pain Hyperlipidemia Colonic polyps, hx of  Past Surgical History: Reviewed history from 09/02/2007 and no changes required. EGD 2007 with HH and esophagitis Cholecystectomy Hysterectomy Oophorectomy  Family History: Reviewed history from 09/02/2007 and no changes required. longevity  Social History: Reviewed history from 09/02/2007 and no changes required. Single Never Smoked  Review of Systems  The patient denies anorexia, fever, weight loss, weight gain, vision loss, decreased hearing, hoarseness, chest pain, syncope, dyspnea  on exertion, peripheral edema, prolonged cough, headaches, hemoptysis, abdominal pain, melena, hematochezia, severe indigestion/heartburn, hematuria, incontinence, genital sores, muscle weakness, suspicious skin lesions, transient blindness, difficulty walking, depression, unusual weight change, abnormal bleeding, enlarged lymph nodes, angioedema, and breast masses.    Physical Exam  General:  alert.  lost weight due to diet Head:  Normocephalic and atraumatic without obvious abnormalities. No apparent alopecia or balding. Eyes:  pupils equal and pupils round.   Ears:  R ear normal and L ear normal.   Neck:  No deformities, masses, or tenderness noted. Lungs:  normal respiratory effort and no wheezes.   Heart:  normal rate and regular rhythm.   Abdomen:  soft and non-tender.   Skin:  lichenificnation of palms Psych:  labile affect and moderately anxious.     Impression & Recommendations:  Problem # 1:  ANXIETY STATE, UNSPECIFIED (ICD-300.00)  The following medications were removed from the medication list:    Clonazepam 0.5 Mg Tabs (Clonazepam) ..... One by mouth  two times a day daily    Cymbalta 30 Mg Cpep (Duloxetine hcl) .Marland Kitchen... Take 1 capsule by mouth once a day Her updated medication list for this problem includes:    Alprazolam 0.5 Mg Tabs (Alprazolam) ..... One by mouth q hs  Discussed medication use and relaxation techniques.   Problem # 2:  LICHEN SIMPLEX CHRONICUS (ICD-698.3)  stop washing hand excessively and rubbiing palm use cotton gloves and apply the steriod cream at night  Orders: Prescription Created Electronically [phone])  Problem # 3:  FIBROMYALGIA (ICD-729.1)  Her updated medication list for this problem includes:    Mobic 7.5 Mg Tabs (Meloxicam) ..... Once daily    Ultram 50 Mg Tabs (Tramadol hcl) ..... One by mouth  every 6 hours as needed pain  Problem # 4:  ASTHMA (ICD-493.90) Assessment: Unchanged samples given ( cost is an issue Her updated  medication list for this problem includes:    Qvar 80 Mcg/act Aers (Beclomethasone dipropionate) ..... Inhale 2 puff as directed twice a day  Complete Medication List: 1)  Qvar 80 Mcg/act Aers (Beclomethasone dipropionate) .... Inhale 2 puff as directed twice a day 2)  Magnesium Gluconate 250 Mg Tabs (Magnesium gluconate) .... Once daily 3)  Rhinocort Aqua 32 Mcg/act Susp (Budesonide (nasal)) .... Once daily 4)  Protonix 40 Mg Tbec (Pantoprazole sodium) .... Once daily 5)  Mobic 7.5 Mg Tabs (Meloxicam) .... Once daily 6)  Ultram 50 Mg Tabs (Tramadol hcl) .... One by mouth  every 6 hours as needed pain 7)  Caltrate 600+d Plus 600-400 Mg-unit Tabs (Calcium carbonate-vit d-min) .... One by mouth bid 8)  Alprazolam 0.5 Mg Tabs (Alprazolam) .... One by mouth q hs 9)  Amcinonide 0.1 % Crea (Amcinonide) .... Apply to palm daily as needed for rash  Patient Instructions: 1)  Please schedule a follow-up appointment in 3 months. Prescriptions: AMCINONIDE 0.1 % CREA (AMCINONIDE) apply to palm daily as needed for rash  #30gm  x 3   Entered and Authorized by:   Stacie Glaze MD   Signed by:   Stacie Glaze MD on 01/17/2010   Method used:   Electronically to        [facility]  [address] (retail)       [address]       [place], [place]  [address]       Ph: [phone]       Fax: [phone]   RxID:   [id number] ALPRAZOLAM 0.5 MG TABS (ALPRAZOLAM) one by mouth q HS  #30 x 11   Entered and Authorized by:   Stacie Glaze MD   Signed by:   Stacie Glaze MD on 01/17/2010   Method used:   Print then Give to Patient   RxID:   [id number]

## 2011-01-28 NOTE — Assessment & Plan Note (Signed)
Summary: NP3/PALPS   History of Present Illness: [name] is seen today at the request of Dr Rogelia Rohrer  She has had daily palptations over the last 2 weeks.  No previous heart history.  ? History of murmur.  Indicates they are not related to exertion.  Made worse by caffeine.  ECG 8/15 [facility] NSR with RBBB.  She feels more jittery in general and may be having an anxiety state.  Reviewed lab work form 8/15 and TSH was normal.  No SSCP dyspnea or syncope.  Palpitatons can last minutes and occure daily.  Slight improvement over the last couple of days. No other exacerbating factors other than caffeine.  Occur night or day  Current Problems (verified): 1)  Palpitations  (ICD-785.1) 2)  Heart Murmur, Hx of  (ICD-V12.50) 3)  Hyperlipidemia  (ICD-272.4) 4)  Diverticular Disease  (ICD-562.10) 5)  Cholelithiasis  (ICD-574.20) 6)  Gallstone Pancreatitis  (ICD-577.9) 7)  Arthritis  (ICD-716.90) 8)  Dysphagia Unspecified  (ICD-787.20) 9)  Hematuria Unspecified  (ICD-599.70) 10)  Encounter For Long-term Use of Other Medications  (ICD-V58.69) 11)  Polyuria  (ICD-788.42) 12)  Ventricular Hypertrophy, Left  (ICD-429.3) 13)  Insomnia  (ICD-780.52) 14)  Paresthesia  (ICD-782.0) 15)  Lichen Simplex Chronicus  (ICD-698.3) 16)  Osteoporosis  (ICD-733.00) 17)  Anxiety State, Unspecified  (ICD-300.00) 18)  Weight Loss, Abnormal  (ICD-783.21) 19)  Colonic Polyps, Hx of  (ICD-V12.72) 20)  Uns Advrs Eff Uns Rx Medicinal&biological Sbstnc  (ICD-995.20) 21)  Low Back Pain  (ICD-724.2) 22)  Vitamin B12 Deficiency  (ICD-266.2) 23)  Gerd  (ICD-530.81) 24)  Fibromyalgia  (ICD-729.1) 25)  Osteopenia  (ICD-733.90) 26)  Asthma  (ICD-493.90)  Current Medications (verified): 1)  Qvar 80 Mcg/act Aers (Beclomethasone Dipropionate) .... Inhale 2 Puff At Bedtime 2)  Magnesium Gluconate 250 Mg  Tabs (Magnesium Gluconate) .... Once Daily 3)  Rhinocort Aqua 32 Mcg/act  Susp (Budesonide (Nasal)) .... Once Daily As  Needed 4)  Protonix 40 Mg  Tbec (Pantoprazole Sodium) .... Once Daily As Needed 5)  Mobic 7.5 Mg  Tabs (Meloxicam) .... Once Daily 6)  Caltrate 600+d Plus 600-400 Mg-Unit Tabs (Calcium Carbonate-Vit D-Min) .... One By Mouth Bid 7)  Alprazolam 1 Mg Tabs (Alprazolam) .... One By Mouth Q Hs 8)  Premarin 0.625 Mg/gm Crea (Estrogens, Conjugated) 9)  Lorazepam 0.5 Mg Tabs (Lorazepam) .Marland Kitchen.. 1-2 Tabs By Mouth At Bedtime As Needed Anxiety or Insomnia in Place of Alprazolam 10)  Cipro 250 Mg Tabs (Ciprofloxacin Hcl) .Marland Kitchen.. 1 Tab By Mouth Two Times A Day X5 Days  Allergies (verified): 1)  ! Celebrex (Celecoxib)  Past History:  Past Medical History: Last updated: 09/03/2010 palpitation HEART MURMUR, HX OF HYPERLIPIDEMIA  DIVERTICULAR DISEASE  CHOLELITHIASIS  GALLSTONE PANCREATITIS  ARTHRITIS  DYSPHAGIA UNSPECIFIED  HEMATURIA UNSPECIFIED  ENCOUNTER FOR LONG-TERM USE OF OTHER MEDICATIONS POLYURIA  VENTRICULAR HYPERTROPHY, LEFT  INSOMNIA  PARESTHESIA  LICHEN SIMPLEX CHRONICUS  OSTEOPOROSIS  ANXIETY STATE, UNSPECIFIED  WEIGHT LOSS, ABNORMAL  COLONIC POLYPS, HX OF  UNS ADVRS EFF UNS RX MEDICINAL&BIOLOGICAL SBSTNC  LOW BACK PAIN  VITAMIN B12 DEFICIENCY  GERD  FIBROMYALGIA  OSTEOPENIA  ASTHMA   Past Surgical History: Last updated: 09/03/2010 EGD 2007 with HH and esophagitis Cholecystectomy Hysterectomy Oophorectomy Neck surgery, especially cervical neck. appendectomy  Family History: Last updated: 09/03/2010   Dad with a history of prostate cancer.  Mom died in her 30s  secondary to stroke and myocardial infarction.  Social History: Last updated: 09/03/2010 Single Never Smoked  She is retired.  She is a widow.  He has six children and  lives in [place], [place].  Review of Systems       Denies fever, malais, weight loss, blurry vision, decreased visual acuity, cough, sputum, SOB, hemoptysis, pleuritic pain,  heartburn, abdominal pain, melena, lower extremity edema,  claudication, or rash.   Vital Signs:  Patient profile:   75 year old female Height:      65 inches Weight:      147 pounds BMI:     24.55 Pulse rate:   70 / minute Resp:     14 per minute BP sitting:   150 / 70  (left arm)  Vitals Entered By: Kem Parkinson (September 05, 2010 3:05 PM)  Physical Exam  General:  Affect appropriate Healthy:  appears stated age HEENT: normal Neck supple with no adenopathy JVP normal no bruits no thyromegaly Lungs clear with no wheezing and good diaphragmatic motion Heart:  S1/S2 no murmur,rub, gallop or click PMI normal Abdomen: benighn, BS positve, no tenderness, no AAA no bruit.  No HSM or HJR Distal pulses intact with no bruits No edema Neuro non-focal Skin warm and dry    Impression & Recommendations:  Problem # 1:  PALPITATIONS (ICD-785.1) Beinign sounding.  Given frequency will get event monitor Orders: Event (Event)  Problem # 2:  HEART MURMUR, HX OF (ICD-V12.50) None on exam today.  Given history and palpitations will get echo to R/O structural heart disease Orders: Echocardiogram (Echo)  Problem # 3:  HYPERLIPIDEMIA (ICD-272.4) Continue diet Rx at goal with no known vascular disease CHOL: 157 (04/18/2010)   LDL: 80 (09/13/2009)   HDL: 66.70 (04/18/2010)   TG: 45.0 (09/13/2009) CHOL (goal): 200 (03/03/2008)   LDL (goal): 160 (03/03/2008)   HDL (goal): 40 (03/03/2008)   TG (goal): 150 (03/03/2008)  Problem # 4:  RBBB (ICD-426.4) Benign no old to compare  Check echo and event monitor  Patient Instructions: 1)  Your physician recommends that you schedule a follow-up appointment in: 6-8 WEEKS 2)  Your physician has requested that you have an echocardiogram.  Echocardiography is a painless test that uses sound waves to create images of your heart. It provides your doctor with information about the size and shape of your heart and how well your heart's chambers and valves are working.  This procedure takes approximately one  hour. There are no restrictions for this procedure. 3)  Your physician has recommended that you wear an event monitor.  Event monitors are medical devices that record the heart's electrical activity. Doctors most often use these monitors to diagnose arrhythmias. Arrhythmias are problems with the speed or rhythm of the heartbeat. The monitor is a small, portable device. You can wear one while you do your normal daily activities. This is usually used to diagnose what is causing palpitations/syncope (passing out).   EKG Report  Procedure date:  08/12/2010  Findings:      NSR RBBB otherwise normal

## 2011-01-28 NOTE — Assessment & Plan Note (Signed)
Summary: FLU-SHOT/RCD  Nurse Visit  CC: Flu shot./kb   Allergies: 1)  ! Celebrex (Celecoxib)  Orders Added: 1)  Flu Vaccine 1yrs + MEDICARE PATIENTS [Q2039] 2)  Administration Flu vaccine - MCR [G0008]         Flu Vaccine Consent Questions     Do you have a history of severe allergic reactions to this vaccine? no    Any prior history of allergic reactions to egg and/or gelatin? no    Do you have a sensitivity to the preservative Thimersol? no    Do you have a past history of Guillan-Barre Syndrome? no    Do you currently have an acute febrile illness? no    Have you ever had a severe reaction to latex? no    Vaccine information given and explained to patient? yes    Are you currently pregnant? no    Lot Number:[id number]   Exp Date:06/28/2011   Site Given  Left Deltoid IMu

## 2011-01-28 NOTE — Assessment & Plan Note (Signed)
Summary: 6 month fup//ccm ---- PT RSC (BMP) // RS   Vital Signs:  Patient profile:   75 year old female Height:      65 inches Weight:      144 pounds BMI:     24.05 Temp:     98.2 degrees F oral Pulse rate:   72 / minute Resp:     14 per minute BP sitting:   136 / 76  (left arm)  Vitals Entered By: Willy Eddy, LPN (October 29, 2010 10:57 AM) CC: roa Is Patient Diabetic? No   Primary Care Provider:  Stacie Glaze MD  CC:  roa.  History of Present Illness: follow up weight loss has stabilized she had an episode of palpitations and was seen and placed on lopressor had an echo the medication helps both the panic and the palpitatons 1/2 a nerve pill helps breathing is stable on the advair the echo showed good ef with thickened valve leaflets and possible LVH   Preventive Screening-Counseling & Management  Alcohol-Tobacco     Smoking Status: never     Passive Smoke Exposure: no     Tobacco Counseling: not indicated; no tobacco use  Problems Prior to Update: 1)  Essential Hypertension  (ICD-401.9) 2)  Rbbb  (ICD-426.4) 3)  Palpitations  (ICD-785.1) 4)  Heart Murmur, Hx of  (ICD-V12.50) 5)  Hyperlipidemia  (ICD-272.4) 6)  Diverticular Disease  (ICD-562.10) 7)  Cholelithiasis  (ICD-574.20) 8)  Gallstone Pancreatitis  (ICD-577.9) 9)  Arthritis  (ICD-716.90) 10)  Dysphagia Unspecified  (ICD-787.20) 11)  Hematuria Unspecified  (ICD-599.70) 12)  Encounter For Long-term Use of Other Medications  (ICD-V58.69) 13)  Polyuria  (ICD-788.42) 14)  Ventricular Hypertrophy, Left  (ICD-429.3) 15)  Insomnia  (ICD-780.52) 16)  Paresthesia  (ICD-782.0) 17)  Lichen Simplex Chronicus  (ICD-698.3) 18)  Osteoporosis  (ICD-733.00) 19)  Anxiety State, Unspecified  (ICD-300.00) 20)  Weight Loss, Abnormal  (ICD-783.21) 21)  Colonic Polyps, Hx of  (ICD-V12.72) 22)  Uns Advrs Eff Uns Rx Medicinal&biological Sbstnc  (ICD-995.20) 23)  Low Back Pain  (ICD-724.2) 24)  Vitamin B12  Deficiency  (ICD-266.2) 25)  Gerd  (ICD-530.81) 26)  Fibromyalgia  (ICD-729.1) 27)  Osteopenia  (ICD-733.90) 28)  Asthma  (ICD-493.90)  Current Problems (verified): 1)  Rbbb  (ICD-426.4) 2)  Palpitations  (ICD-785.1) 3)  Heart Murmur, Hx of  (ICD-V12.50) 4)  Hyperlipidemia  (ICD-272.4) 5)  Diverticular Disease  (ICD-562.10) 6)  Cholelithiasis  (ICD-574.20) 7)  Gallstone Pancreatitis  (ICD-577.9) 8)  Arthritis  (ICD-716.90) 9)  Dysphagia Unspecified  (ICD-787.20) 10)  Hematuria Unspecified  (ICD-599.70) 11)  Encounter For Long-term Use of Other Medications  (ICD-V58.69) 12)  Polyuria  (ICD-788.42) 13)  Ventricular Hypertrophy, Left  (ICD-429.3) 14)  Insomnia  (ICD-780.52) 15)  Paresthesia  (ICD-782.0) 16)  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 Inhale 2 Puff At Bedtime 2)  Magnesium Gluconate 250 Mg  Tabs (Magnesium Gluconate) .... Once Daily 3)  Rhinocort Aqua 32 Mcg/act  Susp (  Budesonide (Nasal)) .... Once Daily As Needed 4)  Protonix 40 Mg  Tbec (Pantoprazole Sodium) .... Once Daily As Needed 5)  Mobic 7.5 Mg  Tabs (Meloxicam) .... Once Daily 6)  Caltrate 600+d Plus 600-400 Mg-Unit Tabs (Calcium Carbonate-Vit D-Min) .... One By Mouth Bid 7)  Alprazolam 1 Mg Tabs (Alprazolam) .... One By Mouth Q Hs 8)  Premarin 0.625 Mg/gm Crea (Estrogens, Conjugated) 9)  Lorazepam 0.5 Mg Tabs (Lorazepam) .Marland Kitchen.. 1-2 Tabs By Mouth At Bedtime As Needed Anxiety or Insomnia in Place of Alprazolam 10)   Cipro 250 Mg Tabs (Ciprofloxacin Hcl) .Marland Kitchen.. 1 Tab By Mouth Two Times A Day X5 Days 11)  Lopressor 50 Mg Tabs (Metoprolol Tartrate) .... Take 1/2 Tablet Twice Per Day  Current Medications (verified): 1)  Qvar 80 Mcg/act Aers (Beclomethasone Dipropionate) .... Inhale 2 Puff At Bedtime 2)  Magnesium Gluconate 250 Mg  Tabs (Magnesium Gluconate) .... Once Daily 3)  Rhinocort Aqua 32 Mcg/act  Susp (Budesonide (Nasal)) .... Once Daily As Needed 4)  Protonix 40 Mg  Tbec (Pantoprazole Sodium) .... Once Daily As Needed 5)  Mobic 7.5 Mg  Tabs (Meloxicam) .... Once Daily 6)  Caltrate 600+d Plus 600-400 Mg-Unit Tabs (Calcium Carbonate-Vit D-Min) .... One By Mouth Bid 7)  Alprazolam 1 Mg Tabs (Alprazolam) .... One By Mouth Two Times A Day 8)  Premarin 0.625 Mg/gm Crea (Estrogens, Conjugated) 9)  Lopressor 50 Mg Tabs (Metoprolol Tartrate) .... Take 1/2 Tablet Twice Per Day  Allergies (verified): 1)  ! Celebrex (Celecoxib)  Past History:  Family History: Last updated: 09/03/2010   Dad with a history of prostate cancer.  Mom died in her 53s  secondary to stroke and myocardial infarction.  Social History: Last updated: 09/03/2010 Single Never Smoked  She is retired.  She is a widow.  He has six children and  lives in [place].  Risk Factors: Smoking Status: never (10/29/2010) Passive Smoke Exposure: no (10/29/2010)  Past medical, surgical, family and social histories (including risk factors) reviewed, and no changes noted (except as noted below).  Past Medical History: Reviewed history from 09/03/2010 and no changes required. palpitation HEART MURMUR, HX OF HYPERLIPIDEMIA  DIVERTICULAR DISEASE  CHOLELITHIASIS  GALLSTONE PANCREATITIS  ARTHRITIS  DYSPHAGIA UNSPECIFIED  HEMATURIA UNSPECIFIED  ENCOUNTER FOR LONG-TERM USE OF OTHER MEDICATIONS POLYURIA  VENTRICULAR HYPERTROPHY, LEFT  INSOMNIA  PARESTHESIA  LICHEN SIMPLEX CHRONICUS  OSTEOPOROSIS  ANXIETY STATE, UNSPECIFIED   WEIGHT LOSS, ABNORMAL  COLONIC POLYPS, HX OF  UNS ADVRS EFF UNS RX MEDICINAL&BIOLOGICAL SBSTNC  LOW BACK PAIN  VITAMIN B12 DEFICIENCY  GERD  FIBROMYALGIA  OSTEOPENIA  ASTHMA   Past Surgical History: Reviewed history from 09/03/2010 and no changes required. EGD 2007 with HH and esophagitis Cholecystectomy Hysterectomy Oophorectomy Neck surgery, especially cervical neck. appendectomy  Family History: Reviewed history from 09/03/2010 and no changes required.   Dad with a history of prostate cancer.  Mom died in her 51s  secondary to stroke and myocardial infarction.  Social History: Reviewed history from 09/03/2010 and no changes required. Single Never Smoked  She is retired.  She is a widow.  He has six children and  lives in [place], [place].  Review of Systems       The patient complains of hoarseness.  The patient denies anorexia, fever, weight loss, weight gain, vision loss, decreased hearing, chest pain, syncope, dyspnea on exertion, peripheral edema, prolonged cough, headaches, hemoptysis, abdominal pain, melena, hematochezia, severe indigestion/heartburn, hematuria, incontinence, genital sores, muscle weakness, suspicious skin lesions,  transient blindness, difficulty walking, depression, unusual weight change, abnormal bleeding, enlarged lymph nodes, angioedema, and breast masses.    Physical Exam  General:  alert.  lost weight due to diet Head:  Normocephalic and atraumatic without obvious abnormalities. No apparent alopecia or balding. Eyes:  pupils equal and pupils round.   Ears:  R ear normal and L ear normal.   Nose:  no external deformity and no nasal discharge.   Mouth:  Oral mucosa and oropharynx without lesions or exudates.  Teeth in good repair. Neck:  No deformities, masses, or tenderness noted. Lungs:  normal respiratory effort and no wheezes.   Heart:  normal rate and regular rhythm.   Abdomen:  soft and non-tender.   Neurologic:  alert &  oriented X3.     Impression & Recommendations:  Problem # 1:  ESSENTIAL HYPERTENSION (ICD-401.9)  Her updated medication list for this problem includes:    Lopressor 50 Mg Tabs (Metoprolol tartrate) .Marland Kitchen... Take 1/2 tablet twice per day  BP today: 136/76 Prior BP: 150/70 (09/05/2010)  Prior 10 Yr Risk Heart Disease: 6 % (09/27/2009)  Labs Reviewed: K+: 3.9 (08/12/2010) Creat: : 0.6 (08/12/2010)   Chol: 157 (04/18/2010)   HDL: 66.70 (04/18/2010)   LDL: 80 (09/13/2009)   TG: 45.0 (09/13/2009)  Problem # 2:  PALPITATIONS (ICD-785.1)  Her updated medication list for this problem includes:    Lopressor 50 Mg Tabs (Metoprolol tartrate) .Marland Kitchen... Take 1/2 tablet twice per day  Avoid caffeine, chocolate, and stimulants. Stress reduction as well as medication options discussed.   Problem # 3:  ANXIETY STATE, UNSPECIFIED (ICD-300.00)  The following medications were removed from the medication list:    Lorazepam 0.5 Mg Tabs (Lorazepam) .Marland Kitchen... 1-2 tabs by mouth at bedtime as needed anxiety or insomnia in place of alprazolam Her updated medication list for this problem includes:    Alprazolam 1 Mg Tabs (Alprazolam) ..... One by mouth two times a day  Discussed medication use and relaxation techniques.   Problem # 4:  WEIGHT LOSS, ABNORMAL (ICD-783.21) due to anxiety  Complete Medication List: 1)  Qvar 80 Mcg/act Aers (Beclomethasone dipropionate) .... Inhale 2 puff at bedtime 2)  Magnesium Gluconate 250 Mg Tabs (Magnesium gluconate) .... Once daily 3)  Rhinocort Aqua 32 Mcg/act Susp (Budesonide (nasal)) .... Once daily as needed 4)  Protonix 40 Mg Tbec (Pantoprazole sodium) .... Once daily as needed 5)  Mobic 7.5 Mg Tabs (Meloxicam) .... Once daily 6)  Caltrate 600+d Plus 600-400 Mg-unit Tabs (Calcium carbonate-vit d-min) .... One by mouth bid 7)  Alprazolam 1 Mg Tabs (Alprazolam) .... One by mouth two times a day 8)  Premarin 0.625 Mg/gm Crea (Estrogens, conjugated) 9)  Lopressor 50 Mg  Tabs (Metoprolol tartrate) .... Take 1/2 tablet twice per day  Patient Instructions: 1)  Please schedule a follow-up appointment in 3 months. Prescriptions: ALPRAZOLAM 1 MG TABS (ALPRAZOLAM) one by mouth two times a day  #60 x 4   Entered and Authorized by:   Stacie Glaze MD   Signed by:   Stacie Glaze MD on 10/29/2010   Method used:   Print then Give to Patient   RxID:   [id number] MOBIC 7.5 MG  TABS (MELOXICAM) once daily  #30 x 11   Entered and Authorized by:   Stacie Glaze MD   Signed by:   Stacie Glaze MD on 10/29/2010   Method used:   Electronically to        [facility]  [address] (retail)       [address]       [place], [place]  [address]       Ph: [phone]       Fax: [phone]   RxID:   [id number]    Orders Added: 1)  Est. Patient Level IV [40102]

## 2011-01-28 NOTE — Progress Notes (Signed)
Summary: pt rtn your call/lg  Phone Note Call from Patient Call back at Home Phone [phone]   Caller: Patient Reason for Call: Talk to Nurse, Talk to Doctor Summary of Call: pt rtn call to Debra from yesterday she will be home til 12:30 Initial call taken by: Omer Jack,  October 08, 2010 8:41 AM  Follow-up for Phone Call        spoke with pt, she had an episode of tachycardia yesterday. the pt states she was not aware of the fast heart rate. she was very busy and felt fine. monitor reviewed by dr Eden Emms and shows sinus tach. reassurance given to pt. Deliah Goody, RN  October 08, 2010 12:03 PM

## 2011-01-28 NOTE — Progress Notes (Signed)
Summary: monitor  Phone Note Outgoing Call Call back at Home Phone [phone]   Action Taken: Appt scheduled Summary of Call: Pt will have monitor put on same day of echo 09/23/10

## 2011-01-28 NOTE — Progress Notes (Signed)
  Phone Note Outgoing Call   Call placed by: Deliah Goody, RN,  October 07, 2010 3:28 PM Summary of Call: received fax from [facility], pt with an episode of fast heart beat rate 140,appears to be sinus tach. Left message to call back Deliah Goody, RN  October 07, 2010 3:32 PM

## 2011-01-28 NOTE — Assessment & Plan Note (Signed)
Summary: 3 month rov/njr   Vital Signs:  Patient profile:   75 year old female Height:      65 inches Weight:      148 pounds BMI:     24.72 Temp:     98.2 degrees F oral Pulse rate:   76 / minute Pulse rhythm:   regular Resp:     14 per minute BP sitting:   122 / 70  (left arm)  Vitals Entered By: Willy Eddy, LPN (April 18, 2010 10:15 AM) CC: roa, Lipid Management   CC:  roa and Lipid Management.  History of Present Illness: weight stable monitering of blood pressure and lipids the pt has OA, GERD, and  asthma discusison of vitamins   Lipid Management History:      Positive NCEP/ATP III risk factors include female age 39 years old or older.  Negative NCEP/ATP III risk factors include no family history for ischemic heart disease, non-tobacco-user status, non-hypertensive, no ASHD (atherosclerotic heart disease), no prior stroke/TIA, no peripheral vascular disease, and no history of aortic aneurysm.     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) 2)  Osteoporosis  (ICD-733.00) 3)  Anxiety State, Unspecified  (ICD-300.00) 4)  Weight Loss, Abnormal  (ICD-783.21) 5)  Colonic Polyps, Hx of  (ICD-V12.72) 6)  Hyperlipidemia  (ICD-272.4) 7)  Uns Advrs Eff Uns Rx Medicinal&biological Sbstnc  (ICD-995.20) 8)  Low Back Pain  (ICD-724.2) 9)  Vitamin B12 Deficiency  (ICD-266.2) 10)  Gerd  (ICD-530.81) 11)  Fibromyalgia  (ICD-729.1) 12)  Osteopenia  (ICD-733.90) 13)  Asthma  (ICD-493.90)  Current Problems (verified): 1)  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 Inhale 2 Puff As Directed Twice A Day 2)  Magnesium Gluconate 250 Mg  Tabs (Magnesium Gluconate) .... Once Daily 3)  Rhinocort Aqua 32 Mcg/act  Susp (Budesonide (Nasal)) .... Once Daily 4)  Protonix 40 Mg  Tbec (Pantoprazole Sodium) .... Once Daily 5)  Mobic 7.5 Mg  Tabs (Meloxicam) .... Once Daily 6)  Ultram 50 Mg  Tabs (Tramadol Hcl) .... One By Mouth  Every 6 Hours As Needed Pain 7)  Caltrate 600+d Plus 600-400 Mg-Unit Tabs (Calcium Carbonate-Vit D-Min) .... One By Mouth Bid 8)  Alprazolam 0.5 Mg Tabs (Alprazolam) .... One By Mouth Q Hs 9)  Amcinonide 0.1 % Crea (Amcinonide) .... Apply To Palm Daily As Needed For Rash  Current Medications (verified): 1)  Qvar 80 Mcg/act Aers (Beclomethasone Dipropionate) .... Inhale 2 Puff As Directed Twice A Day 2)  Magnesium Gluconate 250 Mg  Tabs (Magnesium Gluconate) .... Once Daily 3)  Rhinocort Aqua 32 Mcg/act  Susp (Budesonide (Nasal)) .... Once Daily 4)  Protonix 40 Mg  Tbec (Pantoprazole Sodium) .... Once Daily 5)  Mobic 7.5 Mg  Tabs (Meloxicam) .... Once Daily 6)  Ultram 50 Mg  Tabs (Tramadol Hcl) .... One By Mouth  Every 6 Hours As Needed Pain 7)  Caltrate 600+d Plus 600-400 Mg-Unit Tabs (Calcium Carbonate-Vit D-Min) .... One By Mouth Bid 8)  Alprazolam 0.5 Mg Tabs (Alprazolam) .... One By Mouth Q Hs 9)  Amcinonide 0.1 % Crea (Amcinonide) .... Apply To Palm Daily As Needed For Rash  Allergies (verified): 1)  ! Celebrex (Celecoxib)  Past History:  Family History: Last updated: 09/02/2007 longevity  Social History: Last updated: 09/02/2007 Single Never Smoked  Risk Factors: Smoking Status: never (04/18/2010) Passive Smoke Exposure: no (04/18/2010)  Past medical,  surgical, family and social histories (including risk factors) reviewed, and no changes noted (except as noted below).  Past Medical History: Reviewed history from 06/02/2008 and no changes required. Asthma Osteopenia fibromyalgia GERD Low back pain Hyperlipidemia Colonic polyps, hx of  Past Surgical History: Reviewed history from 09/02/2007 and no changes required. EGD 2007 with HH and esophagitis Cholecystectomy Hysterectomy Oophorectomy  Family History: Reviewed history from 09/02/2007 and no changes required. longevity  Social History: Reviewed history from 09/02/2007 and no changes required. Single Never Smoked  Review of Systems  The patient denies anorexia, fever, weight loss, weight gain, vision loss, decreased hearing, hoarseness, chest pain, syncope, dyspnea on exertion, peripheral edema, prolonged cough, headaches, hemoptysis, abdominal pain, melena, hematochezia, severe indigestion/heartburn, hematuria, incontinence, genital sores, muscle weakness, suspicious skin lesions, transient blindness, difficulty walking, depression, unusual weight change, abnormal bleeding, enlarged lymph nodes, angioedema, and breast masses.    Physical Exam  General:  alert.  lost weight due to diet Head:  Normocephalic and atraumatic without obvious abnormalities. No apparent alopecia or balding. Eyes:  pupils equal and pupils round.   Ears:  R ear normal and L ear normal.   Nose:  no external deformity and no nasal discharge.   Mouth:  Oral mucosa and oropharynx without lesions or exudates.  Teeth in good repair. Neck:  No deformities, masses, or tenderness noted. Lungs:  normal respiratory effort and no wheezes.   Heart:  normal rate and regular rhythm.   Abdomen:  soft and non-tender.     Impression & Recommendations:  Problem # 1:  OSTEOPOROSIS (ICD-733.00)  Discussed medication use, applications of heat or ice, and exercises.   Orders: Venipuncture (95621) TLB-Calcium  (82310-CA) T-Vitamin D (25-Hydroxy) (30865-78469)  Problem # 2:  HYPERLIPIDEMIA (ICD-272.4) monitering on fish oil Orders: TLB-Cholesterol, Total (82465-CHO) TLB-Cholesterol, Direct LDL (83721-DIRLDL) TLB-Cholesterol, HDL (83718-HDL) TLB-TSH (Thyroid Stimulating Hormone) (84443-TSH)  Labs Reviewed: SGOT: 21 (09/13/2009)   SGPT: 22 (09/13/2009)  Lipid Goals: Chol Goal: 200 (03/03/2008)   HDL Goal: 40 (03/03/2008)   LDL Goal: 160 (03/03/2008)   TG Goal: 150 (03/03/2008)  Prior 10 Yr Risk Heart Disease: 6 % (09/27/2009)   HDL:55.80 (09/13/2009), 66.3 (01/22/2009)  LDL:80 (09/13/2009), 82 (01/22/2009)  Chol:145 (09/13/2009), 156 (01/22/2009)  Trig:45.0 (09/13/2009), 38 (01/22/2009)  Problem # 3:  VITAMIN B12 DEFICIENCY (ICD-266.2) monitering anemia and blood work Orders: TLB-B12 + Folate Pnl (82746_82607-B12/FOL)  Problem # 4:  ASTHMA (ICD-493.90) stable on meds Her updated medication list for this problem includes:    Qvar 80 Mcg/act Aers (Beclomethasone dipropionate) ..... Inhale 2 puff as directed twice a day  Complete Medication List: 1)  Qvar 80 Mcg/act Aers (Beclomethasone dipropionate) .... Inhale 2 puff as directed twice a day 2)  Magnesium Gluconate 250 Mg Tabs (Magnesium gluconate) .... Once daily 3)  Rhinocort Aqua 32 Mcg/act Susp (Budesonide (nasal)) .... Once daily 4)  Protonix 40 Mg Tbec (Pantoprazole sodium) .Marland KitchenMarland KitchenMarland Kitchen  Once daily 5)  Mobic 7.5 Mg Tabs (Meloxicam) .... Once daily 6)  Ultram 50 Mg Tabs (Tramadol hcl) .... One by mouth  every 6 hours as needed pain 7)  Caltrate 600+d Plus 600-400 Mg-unit Tabs (Calcium carbonate-vit d-min) .... One by mouth bid 8)  Alprazolam 0.5 Mg Tabs (Alprazolam) .... One by mouth q hs 9)  Amcinonide 0.1 % Crea (Amcinonide) .... Apply to palm daily as needed for rash  Other Orders: Prescription Created Electronically [id number])  Lipid Assessment/Plan:      Based on NCEP/ATP III, the patient's risk factor category is "0-1 risk factors".   The patient's lipid goals are as follows: Total cholesterol goal is 200; LDL cholesterol goal is 160; HDL cholesterol goal is 40; Triglyceride goal is 150.  Her LDL cholesterol goal has been met.    Patient Instructions: 1)  Please schedule a follow-up appointment in 6 months. Prescriptions: ULTRAM 50 MG  TABS (TRAMADOL HCL) one by mouth  every 6 hours as needed pain  #60 x 6   Entered by:   Willy Eddy, LPN   Authorized by:   Stacie Glaze MD   Signed by:   Willy Eddy, LPN on 84/69/6295   Method used:   Electronically to        [facility]   [address] (retail)       [address]       [place], [place]  [address]       Ph: [phone]       Fax: [phone]   RxID:   [id number]

## 2011-01-28 NOTE — Procedures (Signed)
Summary: Summary Report  Summary Report   Imported By: Erle Crocker 11/01/2010 13:44:46  _____________________________________________________________________  External Attachment:    Type:   Image     Comment:   External Document

## 2011-01-28 NOTE — Assessment & Plan Note (Signed)
Summary: heart palps/njr   Vital Signs:  Patient profile:   75 year old female Weight:      147 pounds O2 Sat:      96 % on Room air Temp:     98.3 degrees F oral Pulse rate:   76 / minute BP sitting:   130 / 80  (left arm) Cuff size:   regular  Vitals Entered By: Romualdo Bolk, CMA (AAMA) (August 12, 2010 10:51 AM) CC: Heart Palps, burning in legs and feet, hands, feet, arms and legs feel numb, no sob, no chest pain. This started 2 weeks ago   CC:  Heart Palps, burning in legs and feet, hands, feet, arms and legs feel numb, no sob, and no chest pain. This started 2 weeks ago.  History of Present Illness: Patient in today for evaluation of some paresthesias she has been dealing with off and on for roughly 2 weeks. She denies any while int he office but is having the symptoms in her feet daily, worst at bedtime and in her hands less frequently. She describes her feet and legs up to mid Tibia as tingling and uncomfortable when she lies down at night. Her L>R side, she gets less intense, less frequent similar feelings in her hands only at bedtime. No recent change in diet, activity and no recent illness. She acknowledges a long history of anxiety  and reports she has not been sleeping well. She takes Alprazolam at bedtime but still wakes up on average twice a night. She denies any f/c/malaise/disequilibrium/CP/palp/SOB/GI or GU c/o. No n/v/anorexia/diarrhea. Moves her bowels comfortably once daily. She denies any weakess or other neurolgic complaints. Has taken Vitamin B 12 shots in the past but is presently just using SL tabs.  Preventive Screening-Counseling & Management  Alcohol-Tobacco     Smoking Status: never     Passive Smoke Exposure: no  Current Medications (verified): 1)  Qvar 80 Mcg/act Aers (Beclomethasone Dipropionate) .... Inhale 2 Puff As Directed Twice A Day 2)  Magnesium Gluconate 250 Mg  Tabs (Magnesium Gluconate) .... Once Daily 3)  Rhinocort Aqua 32 Mcg/act  Susp  (Budesonide (Nasal)) .... Once Daily 4)  Protonix 40 Mg  Tbec (Pantoprazole Sodium) .... Once Daily 5)  Mobic 7.5 Mg  Tabs (Meloxicam) .... Once Daily 6)  Ultram 50 Mg  Tabs (Tramadol Hcl) .... One By Mouth  Every 6 Hours As Needed Pain 7)  Caltrate 600+d Plus 600-400 Mg-Unit Tabs (Calcium Carbonate-Vit D-Min) .... One By Mouth Bid 8)  Alprazolam 1 Mg Tabs (Alprazolam) .... One By Mouth Q Hs 9)  Amcinonide 0.1 % Crea (Amcinonide) .... Apply To Palm Daily As Needed For Rash 10)  Premarin 0.625 Mg/gm Crea (Estrogens, Conjugated)  Allergies (verified): 1)  ! Celebrex (Celecoxib)  Past History:  Past medical history reviewed for relevance to current acute and chronic problems. Social history (including risk factors) reviewed for relevance to current acute and chronic problems.  Past Medical History: Reviewed history from 06/02/2008 and no changes required. Asthma Osteopenia fibromyalgia GERD Low back pain Hyperlipidemia Colonic polyps, hx of  Social History: Reviewed history from 09/02/2007 and no changes required. Single Never Smoked  Review of Systems      See HPI  Physical Exam  General:  Well-developed,well-nourished,in no acute distress; alert,appropriate and cooperative throughout examination Head:  Normocephalic and atraumatic without obvious abnormalities. No apparent alopecia or balding. Eyes:  No corneal or conjunctival inflammation noted. EOMI. Perrla. Funduscopic exam benign, without hemorrhages, exudates or papilledema.  Vision grossly normal. Ears:  External ear exam shows no significant lesions or deformities.  Otoscopic examination reveals clear canals, tympanic membranes are intact bilaterally without bulging, retraction, inflammation or discharge. Hearing is grossly normal bilaterally. Nose:  External nasal examination shows no deformity or inflammation. Nasal mucosa are pink and moist without lesions or exudates. Mouth:  Oral mucosa and oropharynx without  lesions or exudates.  Teeth in good repair. Neck:  No deformities, masses, or tenderness noted. Lungs:  Normal respiratory effort, chest expands symmetrically. Lungs are clear to auscultation, no crackles or wheezes. Heart:  Normal rate and regular rhythm. S1 and S2 normal without gallop, click, rub or other extra sounds.grade 1-2 /6 systolic murmur.   Abdomen:  Bowel sounds positive,abdomen soft and non-tender without masses, organomegaly or hernias noted. Pulses:  R and L dorsalis pedis and posterior tibial pulses are full and equal bilaterally Extremities:  No clubbing, cyanosis, edema, or deformity noted    Neurologic:  No cranial nerve deficits noted. Station and gait are normal. Plantar reflexes are down-going bilaterally. DTRs are symmetrical throughout. Sensory, motor and coordinative functions appear intact. Cervical Nodes:  No lymphadenopathy noted Psych:  Cognition and judgment appear intact. Alert and cooperative with normal attention span and concentration. No apparent delusions, illusions, hallucinations. slightly anxious.     Impression & Recommendations:  Problem # 1:  VENTRICULAR HYPERTROPHY, LEFT (ICD-429.3)  Orders: TLB-Renal Function Panel (80069-RENAL) Cardiology Referral (Cardiology) LVH and LAE abnormality noted on EKG today referred for further evaluaiton.   Problem # 2:  PARESTHESIA (ICD-782.0) Check ESR, liver, renal, vit B12 and CBC, given Vitamin B 12 shot today due to history. Await lab results  Problem # 3:  INSOMNIA (ICD-780.52) with h/o insomnia, given just 10 Lorazepam 0.5mg  to try in place of Alprazolam to see if it helps her sleep better. If so could consider changing med in future.  Problem # 4:  POLYURIA (PPI-951.88)  Orders: TLB-Sedimentation Rate (ESR) (85652-ESR) Venipuncture (41660) Specimen Handling (63016) Urinalysis-dipstick only (Medicare patient) (01093AT), await results  Complete Medication List: 1)  Qvar 80 Mcg/act Aers  (Beclomethasone dipropionate) .... Inhale 2 puff as directed twice a day 2)  Magnesium Gluconate 250 Mg Tabs (Magnesium gluconate) .... Once daily 3)  Rhinocort Aqua 32 Mcg/act Susp (Budesonide (nasal)) .... Once daily 4)  Protonix 40 Mg Tbec (Pantoprazole sodium) .... Once daily 5)  Mobic 7.5 Mg Tabs (Meloxicam) .... Once daily 6)  Ultram 50 Mg Tabs (Tramadol hcl) .... One by mouth  every 6 hours as needed pain 7)  Caltrate 600+d Plus 600-400 Mg-unit Tabs (Calcium carbonate-vit d-min) .... One by mouth bid 8)  Alprazolam 1 Mg Tabs (Alprazolam) .... One by mouth q hs 9)  Amcinonide 0.1 % Crea (Amcinonide) .... Apply to palm daily as needed for rash 10)  Premarin 0.625 Mg/gm Crea (Estrogens, conjugated) 11)  Lorazepam 0.5 Mg Tabs (Lorazepam) .Marland Kitchen.. 1-2 tabs by mouth at bedtime as needed anxiety or insomnia in place of alprazolam  Other Orders: TLB-CBC Platelet - w/Differential (85025-CBCD) TLB-Hepatic/Liver Function Pnl (80076-HEPATIC) TLB-TSH (Thyroid Stimulating Hormone) (84443-TSH) TLB-Magnesium (Mg) (83735-MG) T-Vitamin B12 (55732-20254) Admin of Therapeutic Inj  intramuscular or subcutaneous (27062) Vit B12 1000 mcg (B7628)  Patient Instructions: 1)  Please schedule a follow-up appointment as needed if symptoms worsen or do not improve 2)  Please schedule an appointment with your primary doctor in : as scheduled 3)  May try 1-2 Lorazepam to replace the Alprazolam temporarily if it helps you sleep better can consider switching  medications. Prescriptions: LORAZEPAM 0.5 MG TABS (LORAZEPAM) 1-2 tabs by mouth at bedtime as needed anxiety or insomnia in place of Alprazolam  #10 x 0   Entered and Authorized by:   Danise Edge MD   Signed by:   Danise Edge MD on 08/12/2010   Method used:   Print then Give to Patient   RxID:   [id number]    Medication Administration  Injection # 1:    Medication: Vit B12 1000 mcg    Diagnosis: VITAMIN B12 DEFICIENCY (ICD-266.2)    Route: IM     Site: L deltoid    Exp Date: 08/30/2011    Lot #: [id number]    Mfr: [employer] Regent    Patient tolerated injection without complications    Given by: Romualdo Bolk, CMA (AAMA) (August 12, 2010 11:42 AM)  Orders Added: 1)  TLB-Renal Function Panel [80069-RENAL] 2)  TLB-CBC Platelet - w/Differential [85025-CBCD] 3)  TLB-Hepatic/Liver Function Pnl [80076-HEPATIC] 4)  TLB-TSH (Thyroid Stimulating Hormone) [84443-TSH] 5)  TLB-Sedimentation Rate (ESR) [85652-ESR] 6)  TLB-Magnesium (Mg) [83735-MG] 7)  T-Vitamin B12 [82607-23330] 8)  Cardiology Referral [Cardiology] 9)  Admin of Therapeutic Inj  intramuscular or subcutaneous [96372] 10)  Vit B12 1000 mcg [J3420] 11)  Venipuncture [36415] 12)  Specimen Handling [99000] 13)  Urinalysis-dipstick only (Medicare patient) [81003QW] 14)  Est. Patient Level IV [29562]   Appended Document: Orders Update     Clinical Lists Changes  Problems: Added new problem of HEMATURIA UNSPECIFIED (ICD-599.70) Orders: Added new Test order of T-Urine Culture (Spectrum Order) [id number]) - Signed Observations: Added new observation of COMMENTS: Wynona Canes, CMA  August 12, 2010 3:00 PM  (08/12/2010 14:58) Added new observation of PH URINE: 6.5  (08/12/2010 14:58) Added new observation of SPEC GR URIN: 1.020  (08/12/2010 14:58) Added new observation of APPEARANCE U: Clear  (08/12/2010 14:58) Added new observation of UA COLOR: yellow  (08/12/2010 14:58) Added new observation of WBC DIPSTK U: 3+  (08/12/2010 14:58) Added new observation of NITRITE URN: positive  (08/12/2010 14:58) Added new observation of UROBILINOGEN: 0.2  (08/12/2010 14:58) Added new observation of PROTEIN, URN: negative  (08/12/2010 14:58) Added new observation of BLOOD UR DIP: trace-intact  (08/12/2010 14:58) Added new observation of KETONES URN: negative  (08/12/2010 14:58) Added new observation of BILIRUBIN UR: negative  (08/12/2010 14:58) Added new observation of GLUCOSE,  URN: negative  (08/12/2010 14:58)      Laboratory Results   Urine Tests  Date/Time Recieved: August 12, 2010 3:00 PM  Date/Time Reported: August 12, 2010 3:00 PM   Routine Urinalysis   Color: yellow Appearance: Clear Glucose: negative   (Normal Range: Negative) Bilirubin: negative   (Normal Range: Negative) Ketone: negative   (Normal Range: Negative) Spec. Gravity: 1.020   (Normal Range: 1.003-1.035) Blood: trace-intact   (Normal Range: Negative) pH: 6.5   (Normal Range: 5.0-8.0) Protein: negative   (Normal Range: Negative) Urobilinogen: 0.2   (Normal Range: 0-1) Nitrite: positive   (Normal Range: Negative) Leukocyte Esterace: 3+   (Normal Range: Negative)    Comments: Wynona Canes, CMA  August 12, 2010 3:00 PM    Urine dip c/w UTI, would treat with Ciprofloxacin 250mg  by mouth two times a day x 5d and culture urine  Appended Document: heart palps/njr Left message to call back.  Appended Document: heart palps/njr Medications Added CIPRO 250 MG TABS (CIPROFLOXACIN HCL) 1 tab by mouth two times a day X5 days          Clinical  Lists Changes  Medications: Added new medication of CIPRO 250 MG TABS (CIPROFLOXACIN HCL) 1 tab by mouth two times a day X5 days - Signed Rx of CIPRO 250 MG TABS (CIPROFLOXACIN HCL) 1 tab by mouth two times a day X5 days;  #10 x 0;  Signed;  Entered by: Josph Macho RMA;  Authorized by: Danise Edge MD;  Method used: Electronically to [facility] [address] [place], [place], [place]  [address], Ph: [phone], Fax: [phone] Observations: Added new observation of LLIMPORTMEDS: completed (08/13/2010 8:28)    Prescriptions: CIPRO 250 MG TABS (CIPROFLOXACIN HCL) 1 tab by mouth two times a day X5 days  #10 x 0   Entered by:   Josph Macho RMA   Authorized by:   Danise Edge MD   Signed by:   Josph Macho RMA on 08/13/2010   Method used:   Electronically to        [facility]  [address] (retail)       [address]       [place], [place]  [address]       Ph: [phone]       Fax: [phone]   RxID:   [id number]    Appended Document: heart palps/njr pt informed

## 2011-01-29 ENCOUNTER — Ambulatory Visit (INDEPENDENT_AMBULATORY_CARE_PROVIDER_SITE_OTHER): Payer: Medicare Other | Admitting: Internal Medicine

## 2011-01-29 ENCOUNTER — Ambulatory Visit: Admit: 2011-01-29 | Payer: Self-pay | Admitting: Internal Medicine

## 2011-01-29 ENCOUNTER — Encounter: Payer: Self-pay | Admitting: Internal Medicine

## 2011-01-29 VITALS — BP 122/70 | HR 72 | Temp 98.3°F | Resp 14 | Ht 64.0 in | Wt 146.0 lb

## 2011-01-29 DIAGNOSIS — I10 Essential (primary) hypertension: Secondary | ICD-10-CM

## 2011-01-29 DIAGNOSIS — M129 Arthropathy, unspecified: Secondary | ICD-10-CM

## 2011-01-29 DIAGNOSIS — K219 Gastro-esophageal reflux disease without esophagitis: Secondary | ICD-10-CM

## 2011-01-29 DIAGNOSIS — F411 Generalized anxiety disorder: Secondary | ICD-10-CM

## 2011-01-29 DIAGNOSIS — K449 Diaphragmatic hernia without obstruction or gangrene: Secondary | ICD-10-CM

## 2011-01-29 MED ORDER — ALPRAZOLAM 1 MG PO TABS: 1.0000 mg | ORAL_TABLET | Freq: Two times a day (BID) | ORAL | Status: DC

## 2011-01-29 MED ORDER — NABUMETONE 750 MG PO TABS: 750.0000 mg | ORAL_TABLET | Freq: Two times a day (BID) | ORAL | Status: DC

## 2011-01-29 NOTE — Progress Notes (Signed)
  Subjective:    Patient ID: [name], female    DOB: 1936-11-21, 75 y.o.   MRN: [id number]  HPI is a 75 year old African American female who presents for followup of her chronic problems of hypertension hiatal hernia with gastroesophageal reflux osteoarthritis and anxiety.  She states that she does not always take her proton pump inhibitor such as protonic but just as needed to control her reflux and she has not had to use excessive over-the-counter medications.  The patient's blood pressure is been well controlled on her current medications.  The patient's arthritis has not been well controlled on the Mobic and the Mobic seems to not be strong enough to alleviate all of her discomfort she is also concerned that the mother can have effects on her liver. She does have a documented allergy to Celebrex which was a rash she has not tried other nonsteroidals outside of class. She has mild mild to moderate anxiety. She's been taking alprazolam 1 mg by mouth twice a day as needed for anxiety.    Review of Systems    the patient states that she has not had any problem with her vision hearing swallowing breathing any heart palpitations or chest discomfort any abdominal pain diarrhea constipation musculoskeletal pain other than her documented osteoarthritis primarily in in her low back. She has no swelling no abnormal shortness of breath she has mild to moderate anxiety no apparent depression no loss of memory no cognitive loss. Objective:   Physical Exam  On physical  examination he is a pleasant African American female in no apparent distress his blood pressure was 122/70 and pulse  HEENT show pupils are equal round reactive to light and accommodation lung fields were clear to auscultation and percussion. Heart examination revealed normal rate and rhythm with a 1/6 systolic murmur at the apex. Abdomen was soft and nontender bowel sounds are normal extremity examination revealed no cyanosis clubbing or  edema she has no apparent joint swelling. She is oriented to person place and time appropriate in behavior.       Assessment & Plan:  One. Her blood pressure is well controlled on her current medications she was seen by cardiology and begun on a beta blocker which has controlled her palpitations and help with her anxiety.  2 gastroesophageal reflux. The patient's GERD is intermittent she uses protonic or other PPIs as needed but has not taken medication on a chronic basis. She does require nonsteroidal anti-inflammatories for her arthritis and therefore we are concerned that she take something as a prophylactic medication if she continues to use nonsteroidals given her history of a hiatal hernia. 3. Osteoarthritis she is currently on meloxicam which does not appear to control her arthritic pain as much as possible and therefore we will try a different medication Relafen 750 mg by mouth twice a day would be an appropriate medication for a trial given that it has less gastroesophageal side effects.  4. Her anxiety is well controlled we will add alprazolam. at 1 mg by mouth twice a day to her list and give her a refill with 5 refills. 5 asthma she is stable on Qvar samples will be given.

## 2011-01-30 LAB — BASIC METABOLIC PANEL
CO2: 28 mEq/L (ref 19–32)
Chloride: 101 mEq/L (ref 96–112)
Glucose, Bld: 94 mg/dL (ref 70–99)
Sodium: 134 mEq/L — ABNORMAL LOW (ref 135–145)

## 2011-02-18 ENCOUNTER — Other Ambulatory Visit: Payer: Self-pay | Admitting: Gynecology

## 2011-02-18 DIAGNOSIS — Z1231 Encounter for screening mammogram for malignant neoplasm of breast: Secondary | ICD-10-CM

## 2011-03-25 ENCOUNTER — Other Ambulatory Visit: Payer: Self-pay | Admitting: Gynecology

## 2011-03-25 ENCOUNTER — Ambulatory Visit
Admission: RE | Admit: 2011-03-25 | Discharge: 2011-03-25 | Disposition: A | Discharge status: Home / Self Care | Payer: Medicare Other | Source: Ambulatory Visit | Attending: Gynecology | Admitting: Gynecology

## 2011-03-25 DIAGNOSIS — Z1231 Encounter for screening mammogram for malignant neoplasm of breast: Secondary | ICD-10-CM

## 2011-04-09 ENCOUNTER — Other Ambulatory Visit: Payer: Self-pay | Admitting: Cardiovascular Disease

## 2011-04-09 DIAGNOSIS — I10 Essential (primary) hypertension: Secondary | ICD-10-CM

## 2011-05-01 ENCOUNTER — Encounter: Payer: Self-pay | Admitting: Internal Medicine

## 2011-05-01 ENCOUNTER — Ambulatory Visit (INDEPENDENT_AMBULATORY_CARE_PROVIDER_SITE_OTHER): Payer: Medicare Other | Admitting: Internal Medicine

## 2011-05-01 VITALS — BP 106/70 | HR 68 | Temp 98.2°F | Resp 14 | Ht 66.0 in | Wt 150.0 lb

## 2011-05-01 DIAGNOSIS — Z8679 Personal history of other diseases of the circulatory system: Secondary | ICD-10-CM

## 2011-05-01 DIAGNOSIS — I10 Essential (primary) hypertension: Secondary | ICD-10-CM

## 2011-05-01 DIAGNOSIS — E785 Hyperlipidemia, unspecified: Secondary | ICD-10-CM

## 2011-05-01 DIAGNOSIS — E538 Deficiency of other specified B group vitamins: Secondary | ICD-10-CM

## 2011-05-01 DIAGNOSIS — K219 Gastro-esophageal reflux disease without esophagitis: Secondary | ICD-10-CM

## 2011-05-01 LAB — CBC WITH DIFFERENTIAL/PLATELET
Basophils Relative: 0.9 % (ref 0.0–3.0)
Eosinophils Relative: 9.3 % — ABNORMAL HIGH (ref 0.0–5.0)
HCT: 37 % (ref 36.0–46.0)
Lymphs Abs: 1.4 10*3/uL (ref 0.7–4.0)
MCV: 91 fl (ref 78.0–100.0)
Monocytes Absolute: 0.5 10*3/uL (ref 0.1–1.0)
Neutro Abs: 2.9 10*3/uL (ref 1.4–7.7)
RBC: 4.07 Mil/uL (ref 3.87–5.11)
WBC: 5.3 10*3/uL (ref 4.5–10.5)

## 2011-05-01 LAB — BASIC METABOLIC PANEL
BUN: 15 mg/dL (ref 6–23)
Calcium: 9 mg/dL (ref 8.4–10.5)
Creatinine, Ser: 0.6 mg/dL (ref 0.4–1.2)
GFR: 125.93 mL/min (ref >60.00)
Glucose, Bld: 90 mg/dL (ref 70–99)

## 2011-05-01 NOTE — Progress Notes (Signed)
Subjective:    Patient ID: [name], female    DOB: 10/15/1936, 75 y.o.   MRN: [id number]  HPI patient is a pleasant 75 year old African American female who presents for followup of her cholesterol blood pressure reflux and vitamin b deficiency He has been doing relatively well she is compliant with her medications she has asthma that is well controlled on Qvar as her maintenance drug she has not had any flares of her disease is and is very young appearing for her stated age   Review of Systems  Constitutional: Negative for activity change, appetite change and fatigue.  HENT: Negative for ear pain, congestion, neck pain, postnasal drip and sinus pressure.   Eyes: Negative for redness and visual disturbance.  Respiratory: Negative for cough, shortness of breath and wheezing.   Gastrointestinal: Negative for abdominal pain and abdominal distention.  Genitourinary: Negative for dysuria, frequency and menstrual problem.  Musculoskeletal: Negative for myalgias, joint swelling and arthralgias.  Skin: Negative for rash and wound.  Neurological: Negative for dizziness, weakness and headaches.  Hematological: Negative for adenopathy. Does not bruise/bleed easily.  Psychiatric/Behavioral: Negative for sleep disturbance and decreased concentration.       Past Medical History  Diagnosis Date  . VITAMIN B12 DEFICIENCY 09/02/2007  . HYPERLIPIDEMIA 12/09/2007  . ESSENTIAL HYPERTENSION 10/29/2010  . RBBB 09/05/2010  . VENTRICULAR HYPERTROPHY, LEFT 08/12/2010  . GERD 09/02/2007  . DIVERTICULAR DISEASE 09/03/2010  . CHOLELITHIASIS 09/03/2010  . GALLSTONE PANCREATITIS 09/03/2010  . ARTHRITIS 09/03/2010  . OSTEOPOROSIS 05/17/2009  . COLONIC POLYPS, HX OF 06/02/2008  . HEART MURMUR, HX OF 08/12/2010  . UNS ADVRS EFF UNS RX MEDICINAL\T\BIOLOGICAL SBSTNC 12/09/2007  . DYSPHAGIA UNSPECIFIED 09/03/2010  . Palpitations 09/03/2010  . WEIGHT LOSS, ABNORMAL 06/02/2008  . Hiatal hernia    Past Surgical History  Procedure  Date  . Cholecystectomy   . Appendectomy   . Spine surgery     cervical fusion  . Abdominal hysterectomy     THA and O    reports that she has never smoked. She has never used smokeless tobacco. She reports that she does not drink alcohol or use illicit drugs. family history includes Heart disease in her mother; Prostate cancer in her father; and Stroke in her mother. Allergies  Allergen Reactions  . Celebrex (Celecoxib) Rash    REACTION: rash    Objective:   Physical Exam  Constitutional: She is oriented to person, place, and time. She appears well-developed and well-nourished. No distress.  HENT:  Head: Normocephalic and atraumatic.  Right Ear: External ear normal.  Left Ear: External ear normal.  Nose: Nose normal.  Mouth/Throat: Oropharynx is clear and moist.  Eyes: Conjunctivae and EOM are normal. Pupils are equal, round, and reactive to light.  Neck: Normal range of motion. Neck supple. No JVD present. No tracheal deviation present. No thyromegaly present.  Cardiovascular: Normal rate and regular rhythm.   Murmur heard. Pulmonary/Chest: Effort normal and breath sounds normal. She has no wheezes. She exhibits no tenderness.  Abdominal: Soft. Bowel sounds are normal.  Musculoskeletal: Normal range of motion. She exhibits no edema and no tenderness.  Lymphadenopathy:    She has no cervical adenopathy.  Neurological: She is alert and oriented to person, place, and time. She has normal reflexes. No cranial nerve deficit.  Skin: Skin is warm and dry. She is not diaphoretic.  Psychiatric: She has a normal mood and affect. Her behavior is normal.          Assessment &  Plan:  As do monitoring for her B12 deficiency and anemia as well as for her kidney function on blood pressure medications.  We will draw a blood count with platelets B12 level and a basic metabolic panel

## 2011-05-01 NOTE — Assessment & Plan Note (Signed)
Murmur is stable 

## 2011-05-01 NOTE — Assessment & Plan Note (Signed)
Blood pressure stable he is on Lopressor 20 mg

## 2011-05-01 NOTE — Assessment & Plan Note (Addendum)
Her reflux is controlled by the protonix

## 2011-05-08 ENCOUNTER — Encounter: Payer: Self-pay | Admitting: Internal Medicine

## 2011-05-12 ENCOUNTER — Ambulatory Visit (INDEPENDENT_AMBULATORY_CARE_PROVIDER_SITE_OTHER): Payer: Medicare Other | Admitting: Internal Medicine

## 2011-05-12 ENCOUNTER — Encounter: Payer: Self-pay | Admitting: Internal Medicine

## 2011-05-12 VITALS — BP 120/60 | HR 77 | Ht 66.0 in | Wt 150.0 lb

## 2011-05-12 DIAGNOSIS — I452 Bifascicular block: Secondary | ICD-10-CM

## 2011-05-12 DIAGNOSIS — R000 Tachycardia, unspecified: Secondary | ICD-10-CM

## 2011-05-12 NOTE — Assessment & Plan Note (Signed)
Asymptomatic  She may eventually require pacemaker if symptoms of AV block develop No changes

## 2011-05-12 NOTE — Progress Notes (Signed)
The patient presents today for routine electrophysiology followup.  Since last being seen in our clinic, the patient reports doing very well.  She denies any further tachy palpitations. She remains very active and continues to do yard work and other chores without difficulty.  Today, she denies symptoms of palpitations, chest pain, shortness of breath, orthopnea, PND, lower extremity edema, dizziness, presyncope, syncope, or neurologic sequela.  The patient feels that she is tolerating medications without difficulties and is otherwise without complaint today.   Past Medical History  Diagnosis Date  . VITAMIN B12 DEFICIENCY 09/02/2007  . HYPERLIPIDEMIA 12/09/2007  . ESSENTIAL HYPERTENSION 10/29/2010  . RBBB 09/05/2010  . VENTRICULAR HYPERTROPHY, LEFT 08/12/2010  . GERD 09/02/2007  . DIVERTICULAR DISEASE 09/03/2010  . CHOLELITHIASIS 09/03/2010  . GALLSTONE PANCREATITIS 09/03/2010  . ARTHRITIS 09/03/2010  . OSTEOPOROSIS 05/17/2009  . COLONIC POLYPS, HX OF 06/02/2008  . HEART MURMUR, HX OF 08/12/2010  . DYSPHAGIA UNSPECIFIED 09/03/2010  . Palpitations 09/03/2010    event monitor reveals long RP tachycardia, likely sinus tachycardia  . WEIGHT LOSS, ABNORMAL 06/02/2008  . Hiatal hernia   . Hematuria   . Polyuria   . Insomnia   . Paresthesia   . Lichen simplex chronicus   . Anxiety state     unspecified  . Low back pain   . Fibromyalgia   . Osteopenia   . Asthma   . Hx of colonoscopy    Past Surgical History  Procedure Date  . Cholecystectomy   . Appendectomy   . Spine surgery     cervical fusion  . Abdominal hysterectomy     THA and O  . Esophagogastroduodenoscopy     with HH and esophagitis  . Oophorectomy   . Neck surgery     Current Outpatient Prescriptions  Medication Sig Dispense Refill  . ALPRAZolam (XANAX) 1 MG tablet Take 1 tablet (1 mg total) by mouth 2 (two) times daily before a meal.  60 tablet  5  . beclomethasone (QVAR) 80 MCG/ACT inhaler Inhale 2 puffs into the lungs as needed.         . budesonide (RHINOCORT AQUA) 32 MCG/ACT nasal spray 1 spray by Nasal route daily as needed.        . Calcium Carbonate-Vitamin D (CALTRATE 600+D) 600-400 MG-UNIT per chew tablet Chew 1 tablet by mouth 2 (two) times daily.        . Coenzyme Q10 (COQ10 PO) Take by mouth daily.        . Echinacea-Vitamin C 250-250 MG CAPS Take by mouth daily.        Marland Kitchen estrogens, conjugated, (PREMARIN) 0.625 MG tablet Take 0.625 mg by mouth daily. Take daily for 21 days then do not take for 7 days.       . Magnesium 250 MG TABS Take 1 mg by mouth every morning.        . meloxicam (MOBIC) 7.5 MG tablet Take 7.5 mg by mouth daily.        . metoprolol (LOPRESSOR) 50 MG tablet TAKE ONE-HALF TABLET BY MOUTH TWICE DAILY  30 tablet  6  . Multiple Vitamin (MULTIVITAMIN) tablet Take 1 tablet by mouth daily.        . pantoprazole (PROTONIX) 40 MG tablet Take 40 mg by mouth daily.          Allergies  Allergen Reactions  . Celebrex (Celecoxib) Rash    REACTION: rash    History   Social History  . Marital Status: Widowed  Spouse Name: N/A    Number of Children: 6  . Years of Education: N/A   Occupational History  . retired    Social History Main Topics  . Smoking status: Never Smoker   . Smokeless tobacco: Never Used  . Alcohol Use: No  . Drug Use: No  . Sexually Active: No   Other Topics Concern  . Not on file   Social History Narrative   Lives in [place]    Family History  Problem Relation Age of Onset  . Heart disease Mother   . Stroke Mother   . Prostate cancer Father    Physical Exam: Filed Vitals:   05/12/11 1023  BP: 120/60  Pulse: 77  Height: 5\' 6"  (1.676 m)  Weight: 150 lb (68.04 kg)    GEN- The patient is well appearing, alert and oriented x 3 today.   Head- normocephalic, atraumatic Eyes-  Sclera clear, conjunctiva pink Ears- hearing intact Oropharynx- clear Neck- supple, no JVP Lymph- no cervical lymphadenopathy Lungs- Clear to ausculation bilaterally, normal work  of breathing Heart- Regular rate and rhythm, no murmurs, rubs or gallops, PMI not laterally displaced GI- soft, NT, ND, + BS Extremities- no clubbing, cyanosis, or edema MS- no significant deformity or atrophy Skin- no rash or lesion Psych- euthymic mood, full affect Neuro- strength and sensation are intact  ekg reveals sinus rhythm 77 bpm, PR 150, RBBB, LAHB unchanged from 11/11  Assessment and Plan:

## 2011-05-12 NOTE — Assessment & Plan Note (Signed)
Prior event monitor reveals long RP tachycardia, likely sinus tach but possibly atrial tachycardia. Well controlled with metoprolol No changes today

## 2011-05-12 NOTE — Patient Instructions (Signed)
Your physician wants you to follow-up in: 12 months with Dr Allred You will receive a reminder letter in the mail two months in advance. If you don't receive a letter, please call our office to schedule the follow-up appointment.  

## 2011-05-16 NOTE — Discharge Summary (Signed)
[name], [name]                  ACCOUNT NO.:  [id number]   MEDICAL RECORD NO.:  [id number]          PATIENT TYPE:  INP   LOCATION:  6734                         FACILITY:  [facility]   PHYSICIAN:  Lina Sar, M.D. [facility]  DATE OF BIRTH:  11-18-1936   DATE OF ADMISSION:  06/24/2005  DATE OF DISCHARGE:  06/27/2005                                 DISCHARGE SUMMARY   ADMISSION DIAGNOSES:  1.  Gallstone pancreatitis.  2.  Cholelithiasis, question cholecystitis.  3.  History of gastroesophageal reflux disease.  4.  History of diverticulosis.  5.  History of cardiac palpitations.  6.  Status post partial hysterectomy, bilateral tubal ligation, unilateral      oophorectomy, cervical neck surgery and appendectomy.  7.  Intolerances to medications include PENICILLIN and SULFA DRUGS which      have resulted in candidiasis in the past.  8.  Degenerative joint disease.   DISCHARGE DIAGNOSES:  1.  Biliary pancreatitis.  2.  Cholelithiasis, status post status post laparoscopic cholecystectomy      with intraoperative care cholangiogram by Dr. Darnell Level.  3.  Hypokalemia.  4.  Elevated glucose consistent with glucose intolerance.  5.  Mild normocytic anemia. Her hemoglobin on May 22, 2005, was 12.2 and it      was 11.2 near discharge on June 26, 2005.   CONSULTATIONS:  Velora Heckler, M.D., from [facility].   PROCEDURES:  Laparoscopic cholecystectomy with intraoperative cholangiogram.  Note that during the surgery some adhesions were taken down. On  cholangiogram, the cystic duct was long and tortuous. There was free flow of  contrast into the duodenum and the common bile duct was of normal caliber.  The left and right hepatic ductal systems both filled retrograde.   BRIEF HISTORY:  [name] is a very pleasant, generally healthy, 75-year-  old, African-American female. She is known to [facility] because of  history of gastroesophageal reflux disease and asymptomatic  diverticulosis.  For the last couple of months, she has been having intermittent back pain in  the thoracic area. She was seen three weeks ago in the emergency room at  [facility] and was diagnosed with a urinary tract infection. Treated with  antibiotics. LFTs at that time, which was in late May, were within normal  limits.   Last week, the patient was vacationing in [place], [place], and  developed lingering epigastric pain which radiated into her back. Evaluation  occurred at [facility] where an ultrasound of June 21, 2005,  demonstrated an enlarged, stone-filled gallbladder and gallbladder wall  thickening. The common bile duct was normal. The lipase was significantly  elevated at 15,382 with elevation in all of her LFTs as well. Rather rapidly  she turned around and her laboratories quickly moved towards normal with a  lipase the following day to 507. The surgeon, Dr. Donzetta Starch, had  contacted the [place] [facility] office and felt that she was stable for discharge  and wanted her to be seen in followup here in [place], [place],  because she would  need to have her gallbladder removed.   The patient was seen on June 24, 2005, at the [place] office and was not  doing that how well. She was still complaining of moderate upper abdominal  pain was and was not eating much. She had been on Levaquin, as well as  Darvocet and this was helping with the abdominal pain symptoms. The decision  was made to admit her to the hospital for supportive care and for surgical  evaluation. She possibly might need a preoperative ERCP as well.   LABORATORIES:  White blood cell count 9.7, hemoglobin 11.8, hematocrit 35,  MCV 85.9, platelets 283,000. PT 15.1, INR 1.2 and PTT 37. There was a left  shift present on the white cell differential. Sodium 134, potassium 3.0,  glucose 139, BUN 6, creatinine 0.6 and albumin 2.8. Total bilirubin 1.1,  alkaline phosphatase 217, AST 31 and  ALT of 189. Albumin was 2.6. Two-view  chest showed chronic peribronchial changes. CT scan of the abdomen and  pelvis showed uniform enhancement of the pancreas consistent with  pancreatitis, but no evidence for necrosis or pseudocyst formation.  Gallbladder wall thickening, as well as gallstones were noted. There was  some free fluid in the pelvis. Ferritin 355, B12 level 1204, iron 15, total  iron binding capacity 199 and iron saturation 8%.   HOSPITAL COURSE:  The patient was admitted in the early evening of June 24, 2005. Overnight, following hydration with IV fluids, she was improved. She  was seen by Dr. Gerrit Friends in for surgical evaluation. He elected to perform the  surgery on June 26, 2005. By morning following surgery, the patient was  having some postsurgical soreness, but overall felt much better than she had  when she had active pancreatitis. Her LFTs were coming down slightly. She  tolerated diet advancement from clear liquids to a low-fat diet and was  discharged to home in stable condition.   Preoperatively the patient was treated with Cipro. Moderately low potassium  levels were supplemented with both IV and oral potassium. Note that review  of the patient's blood sugars since May show that she has had a reading as  high as 161 on May 22, 2005, and then readings in the mid 120s and 130s  while hospitalized here these last few days. She will need to have post  convalescent blood sugar readings and watch for onset of diabetes mellitus  type 2.   CONDITION AT DISCHARGE:  Stable and improved.    Ferritin 355, B12 level 1204, iron 15, total iron binding capacity 199 and  iron saturation 8%.   DISCHARGE MEDICATIONS:  1.  Digoxin 0.125 mg p.o. daily.  2.  Allegra as needed.  3.  Estrogen patch, dose not known, to be applied weekly.  4.  Protonix 40 mg daily. 5.  Mobic once daily. She is not to restart this for two weeks.  6.  Vicodin one to two p.o. q.6h. p.r.n. pain.    ACTIVITY:  Activity restrictions include no lifting over 10 pounds for the  next three weeks.   WOUND CARE:  Soap and water washing to her incisions, but no scrubbing of  the wounds.   FOLLOWUP:  She is to call Dr. Ardine Eng office for temperatures greater than  101 degrees and she has a followup with Dr. Gerrit Friends and on July 09, 2005, at  9:40 a.m.       SG/MEDQ  D:  06/27/2005  T:  06/27/2005  Job:  [id number]

## 2011-05-16 NOTE — Assessment & Plan Note (Signed)
[facility]                           GASTROENTEROLOGY OFFICE NOTE   [name], [name]                         MRN:          [id number]  DATE:08/03/2006                            DOB:          Dec 13, 1936    [name] comes in as referred by Dr. Lovell Sheehan and [place] because she is having  difficulty over the past 3-4 weeks with dysphagia.  She says solid foods  seem to aggravate her ability to swallow satisfactorily.  She denies any  history of GERD.  She says she has had no heartburn, although when I look in  the chart from previous years we did indicate she has had GERD and she has  also had a history of colon polyps.  She has no lower GI symptoms.   PAST MEDICAL HISTORY:  1.  Arthritis.  2.  Allergies.  3.  Neck surgery, especially cervical neck.  4.  Status post cholecystectomy.   FAMILY HISTORY:  Father had colon cancer.   SOCIAL HISTORY:  Noncontributory.  She does not drink or smoke.  She is  retired.   REVIEW OF SYSTEMS:  Basically noncontributory.   PHYSICAL EXAMINATION:  VITAL SIGNS:  She weighed 165, blood pressure 122/58,  pulse 104.  HEENT:  Oropharynx was negative.  NECK:  Negative.  CHEST:  Clear to auscultation.  CARDIOVASCULAR:  Regular rhythm.  ABDOMEN:  Soft, no masses or organomegaly.  It was nontender.  There were no  bruits or rubs.  EXTREMITIES:  Some mild arthritic changes.   IMPRESSION:  1.  Recent dysphagia of questionable etiology.  2.  History of colon polyps.  3.  Anxiety/depression.  4.  Known degenerative arthritis.  5.  Status post cholecystectomy.   MEDICATIONS:  1.  Xanax for anxiety, she takes 0.5 mg p.r.n.  2.  She has takes Prevacid in the past, now takes Protonix 40 mg daily.  3.  Estrogen patch.  4.  Digoxin.  5.  Mobic.  6.  Meloxicam.  7.  Allegra.  8.  Advair.   RECOMMENDATIONS:  The patient be scheduled for an upper endoscopic  examination and dilatation.  When I told her about it, she  seemed very  anxious so I do not know if she is going to show up for this thing or not,  but we did schedule her.  I told her to continue on the Protonix and gave  some literature on GERD and dysphagia.                                   Ulyess Mort, MD   SML/MedQ  DD:  08/03/2006  DT:  08/04/2006  Job #:  [id number]

## 2011-05-16 NOTE — Op Note (Signed)
[name], [name]                  ACCOUNT NO.:  [id number]   MEDICAL RECORD NO.:  [id number]          PATIENT TYPE:  INP   LOCATION:                               FACILITY:  [facility]   PHYSICIAN:  Velora Heckler, MD      DATE OF BIRTH:  01/03/1936   DATE OF PROCEDURE:  06/26/2005  DATE OF DISCHARGE:                                 OPERATIVE REPORT   PREOPERATIVE DIAGNOSIS:  Biliary pancreatitis, cholelithiasis.   POSTOPERATIVE DIAGNOSIS:  Biliary pancreatitis, cholelithiasis.   PROCEDURE:  Laparoscopic cholecystectomy with intraoperative  cholangiography.   SURGEON:  Velora Heckler, MD.   ASSISTANT:  Violeta Gelinas, MD   ANESTHESIA:  General.   ESTIMATED BLOOD LOSS:  Minimal.   PREPARATION:  Betadine.   COMPLICATIONS:  None.   INDICATIONS:  Patient is a 75 year old black female admitted with biliary  pancreatitis. She has gallstones. Pancreatitis has gradually resolved with  bowel rest and intravenous hydration. The patient now comes to surgery for  cholecystectomy.   PROCEDURES PERFORMED:  OR #16 in [facility]. The patient was  brought to the operating room and placed in the supine position on the  operating room table. Following administration of general anesthesia the  patient is positioned and then prepped and draped in the usual strict  aseptic fashion. After ascertaining that an adequate level of anesthesia  been obtained, an infraumbilical incision was made a #15 blade. Dissection  was carried down through subcutaneous tissues. Fascia is incised in the  midline. The peritoneal cavity is entered cautiously; #0 Vicryl pursestring  suture is placed in the fascia. A Hasson cannula is introduced and secured  with a pursestring suture. Abdomen is insufflated with carbon dioxide.   Laparoscope is introduced and the abdomen explored. There are thin adhesions  to the right lobe of the liver. Operative port is placed in the epigastric  position. Two ports were placed  along the right costal margin. Adhesions  were taken down sharply. Gallbladder was identified. It is located quite  laterally on the right lobe of the liver. Fundus is grasped and retracted  cephalad. Dissection is begun at the neck of the gallbladder. Peritoneum is  incised. Cystic duct is dissected out. Cystic duct is moderately dilated  just less than a centimeter in size. Cystic artery is also dissected out.  Clip is placed at the neck of the gallbladder. Cystic duct is incised. Clear  yellow bile emanates from the cystic duct. Cook cholangiography catheter is  introduced and secured with a ligature clip. Using C-arm fluoroscopy, real  time cholangiography is performed. There is rapid filling of a long tortuous  cystic duct. There is free flow distally into the duodenum without filling  defect or obstruction.  Common bile duct appears to be normal caliber. There  is filling, retrograde, of both the right and left hepatic ductal systems.   Clip is withdrawn and Cook catheter was removed from the peritoneal cavity.  Cystic duct is triply clipped and divided. Cystic artery is doubly clipped  and divided. Gallbladder is then excised from  the gallbladder bed using a  hook electrocautery for hemostasis. Gallbladder is completely excised and  placed into an EndoCatch bag. It is withdrawn through the umbilical port  without difficulty. Right upper quadrant is irrigated with warm saline which  is evacuated. Good hemostasis is noted in the gallbladder bed. Clips appear  to be in good position. There is no evidence of bile leak. Pneumoperitoneum  is released and ports are removed.   Port sites show good hemostasis throughout; #0 Vicryl pursestring suture is  tied securely at the umbilicus. Port sites were anesthetized with local  anesthetic. All wounds were closed with interrupted 4-0 Vicryl subcuticular  sutures. Wounds were washed and dried and Benzoin, and Steri-Strips were  applied. Sterile  dressings are applied. The patient is awakened from  anesthesia and brought to the recovery room in stable condition. The patient  tolerated the procedure well.       TMG/MEDQ  D:  06/26/2005  T:  06/26/2005  Job:  [id number]

## 2011-05-16 NOTE — Consult Note (Signed)
[name], [name]                  ACCOUNT NO.:  [id number]   MEDICAL RECORD NO.:  [id number]          PATIENT TYPE:  INP   LOCATION:  6734                         FACILITY:  [facility]   PHYSICIAN:  Velora Heckler, MD      DATE OF BIRTH:  [birth year]   DATE OF CONSULTATION:  06/25/2005  DATE OF DISCHARGE:                                   CONSULTATION   REASON FOR CONSULTATION:  Biliary pancreatitis.   HOSPITAL COURSE:  Ms. Zeiders is a 75 year old female who was admitted here  yesterday from [facility] in [place], [place], where  she was admitted with gallstone pancreatitis.  Gallbladder ultrasound at  that time revealed an enlarged gallbladder with gallstones as well as  positive wall thickening and a negative Murphy sign.  Her common bile duct  was normal.  The pancreas was normal.  Her LFTs were increased.  Her lipase  was greater than 15,000.  Her total bilirubin was 3.1, and her amylase was  greater than 2800.  Arrangements were made for the patient to transfer to  [facility] where she was accepted by Lina Sar, M.D.  At this point,  this patient has undergone a CT scan which shows pancreatitis without  pseudocyst or necrosis.  She also has gallstones with gallbladder wall  thickening.  On review of her previous and current laboratory studies, Dr.  Lina Sar feels that the stone has passed and that there is no need for  ERCP at this point, and therefore, we can proceed with a cholecystectomy  with IOC.   Her current amylase is 84, lipase 28.  Her pro time/INR is normal.  Her LFTs  are essentially normal.  Her potassium is 3, and this has been repleted.   ALLERGIES:  PENICILLIN and SULFA.   MEDICATIONS:  1. Digoxin.  2. Protonix.  3. Cipro.   PAST MEDICAL HISTORY:  Status post bilateral tubal ligation, history of  hysterectomy with unilateral oophorectomy and appendectomy, history of  palpitations, GERD, diverticulosis, and she has undergone cervical  neck  surgery in the past.   FAMILY HISTORY:  Dad with a history of prostate cancer.  Mom died in her 12s  secondary to stroke and myocardial infarction.   SOCIAL HISTORY:  She is retired.  She is a widow.  He has six children and  lives in [place].   PHYSICAL EXAMINATION:  VITAL SIGNS:  Blood pressure 107/64, pulse 87,  respirations 20, temperature 98.4, O2 saturation 97% on room air.  HEENT:  Grossly normal, no carotid bruits, no JVD or thyromegaly.  Sclerae  are clear.  Conjunctivae normal.  No nares discharge.  CHEST:  Clear to auscultation bilaterally, no wheezing or rhonchi.  HEART:  Regular rate and rhythm, no murmur, rub, or ectopy.  ABDOMEN:  Good bowel sounds, nontender with the except of mild right upper  quadrant tenderness, no masses.  There is a vertical hysterectomy scar.  There is no hernia visible.  EXTREMITIES:  No peripheral edema.  NEUROLOGIC:  Cranial nerves II-XII are grossly intact.  She is alert and  oriented x3.  SKIN:  Warm and dry.  I do not see evidence of jaundice.   ASSESSMENT AND PLAN:  1. Biliary pancreatitis.  2. History of palpitations.  3. Gastroesophageal reflux disease.  4. Diverticulosis.  5. Status post cervical neck surgery.  6. Status post bilateral tubal ligation.  7. Status post hysterectomy with unilateral oophorectomy.  8. Status post appendectomy.   At this point, we will go ahead and prepare the patient with surgery within  the next 1-2 days.  We will go ahead and obtain an EKG and chest x-ray for  preoperative clearance and continue to follow laboratory studies.  The  patient was seen and examined by Dr. Darnell Level.       LB/MEDQ  D:  06/25/2005  T:  06/25/2005  Job:  [id number]   cc:   Lina Sar, M.D. [facility]   Stacie Glaze, M.D. [facility]

## 2011-05-16 NOTE — H&P (Signed)
NAMEKHANH, CORDNER                  ACCOUNT NO.:  [id number]   MEDICAL RECORD NO.:  [id number]          PATIENT TYPE:  INP   LOCATION:  6727                         FACILITY[facility]  [facility]   PHYSICIAN:  Lina Sar, M.D. [facility]  DATE OF BIRTH:  July 13, 1936   DATE OF ADMISSION:  06/24/2005  DATE OF DISCHARGE:                                HISTORY & PHYSICAL   PROBLEM:  Gallstone pancreatitis.   HISTORY:  Santresa is a pleasant 75 year old African American female, generally  in good health, who had onset approximately two months ago with some  intermittent back pain.  She said she then had an episode of abdominal pain  about three weeks ago and was seen in the emergency room at [facility].  At that time had some labs done including LFTs which were normal  and was felt to have a urinary tract infection, was treated accordingly with  an antibiotic.  She says she felt better, though still was having some  intermittent mild mid abdominal discomfort.  Last week she went on vacation  with her daughter to [place] [facility] [place] and on Friday, June 20, 2005, had an  episode of epigastric pain radiating into her back which lasted all night.  This eased off and she felt better the following day but then for dinner  that night ate a hamburger and had recurrent pain which was fairly severe  and associated with nausea and vomiting.  She presented to the emergency  room at [facility] and was admitted there and felt to have  gallstone pancreatitis.  Abdominal ultrasound, done on June 21, 2005, showed  a gallbladder diffusely enlarged with gallstones and gallbladder wall  thickening.  The common bile duct was normal at 5.3-mm.  Labs on admission:  initial lipase was 15,382.  Total bilirubin 3.1, alk phos 373, SGOT of 519,  and SGPT of 623.  Follow up on June 22, 2005, showed a WBC of 11.5,  hemoglobin 12.2, hematocrit of 35.8.  Total bilirubin 0.7, alk phos 117,  SGOT 21, SGPT 26, and lipase, I believe,  of 507.  The surgeon there who had  admitted here, Dr. Donzetta Starch, had contacted our office yesterday  regarding possible transfer and after discussion with him it was felt that  she was actually stable for discharge from the hospital as her symptoms were  much improving and she was tolerating clear liquids.  She was allowed  discharge and today presents to the office for further evaluation.  She continues to complain of upper abdominal pain which is not severe.  She  is using Darvocet with relief.  She has not had any fever or chills.  Her  appetite has been decreased, and she is tolerating clear liquids at this  time.  She has not had any fever or chills and is currently on a course of  Levaquin.  At this time she is admitted to the hospital for further  diagnostic evaluation, possible ERCP, and then laparoscopic cholecystectomy.   CURRENT MEDICATIONS:  1.  Levaquin 500 mg daily.  2.  Digoxin 0.125 every day.  3.  Protonix 40 p.o. every day.  4.  Estrogen patch weekly.   ALLERGIES:  She is not allergic to any antibiotics but says that PENICILLIN  and SULFA DRUGS both cause Candidiasis.   PAST HISTORY:  1.  Partial hysterectomy.  2.  Unilateral oophorectomy.  3.  Appendectomy.  4.  History of palpitations.  5.  GERD.  6.  Diverticulosis.   FAMILY HISTORY:  Father deceased with colon cancer.  Mother with history of  a CVA.   SOCIAL HISTORY:  The patient is widowed.  Currently lives alone.  She has  five daughters in the area who live close-by.  No tobacco.  No ETOH.   REVIEW OF SYSTEMS:  CARDIOVASCULAR:  Denies any chest pain or anginal  symptoms.  PULMONARY:  Negative for cough, shortness of breath, or sputum  production.  GENITOURINARY:  Negative for dysuria, urgency, or frequency.  GI:  As above.   PHYSICAL EXAMINATION:  GENERAL:  A well developed, elderly, African American  female in no acute distress.  She is anicteric.  VITAL SIGNS:  Blood pressure 120/80, pulse  is 82, weight is 176.  HEENT:  Nontraumatic, normocephalic.  EOMI.  PERRLA.  Sclerae are somewhat  muddy but not icteric.  PULMONARY:  Clear to A&P.  ABDOMEN:  Tender in the epigastrium and right upper quadrant.  There is no  guarding or rebound.  No mass or hepatosplenomegaly.  Bowel sounds are  present.  RECTAL:  Not done today.  EXTREMITIES:  Without clubbing, cyanosis, or edema.  NEURO:  Nonfocal.   Labs today pending.   IMPRESSION:  44.  A 75 year old female with a resolving gallstone pancreatitis rule out      past common bile duct stone versus retained common bile duct stone.  2.  Cholelithiasis with probable component of cholecystitis.  3.  Diverticulosis.  4.  Gastroesophageal reflux disease.  5.  Arthritis.  6.  Status post total abdominal hysterectomy and oophorectomy and      appendectomy.   PLAN:  1.  The patient is admitted to the service of Dr. Lina Sar for IV fluid      hydration.  2.  We will check repeat amylase lipase and hepatic function today.  3.  Check CT scan of the abdomen and pelvis to further evaluate the      pancreas, rule out significant phlegmon, etcetera.  4.  Depending on results of her labs and CT, we will decide on need for ERCP      versus lap chole with IOC.  For details please see the orders.   Thank you.       AE/MEDQ  D:  06/24/2005  T:  06/24/2005  Job:  [id number]   cc:   Lina Sar, M.D. [facility], Dr.  Alita Chyle,

## 2011-05-16 NOTE — Discharge Summary (Signed)
[name], [name]                  ACCOUNT NO.:  [id number]   MEDICAL RECORD NO.:  [id number]          PATIENT TYPE:  INP   LOCATION:  6734                         FACILITY[facility]   PHYSICIAN:  Lina Sar, M.D. [facility]  DATE OF BIRTH:  1936-12-25   DATE OF ADMISSION:  06/24/2005  DATE OF DISCHARGE:  06/27/2005                                 DISCHARGE SUMMARY   There was no dictation for this job.  Just add CCs to job #[id number]:   Dr. Lovell Sheehan, [facility] [place].  Dr. Darnell Level, [facility].       SG/MEDQ  D:  06/27/2005  T:  06/27/2005  Job:  [id number]   cc:   Stacie Glaze, M.D. [facility]   Velora Heckler, MD  [address] [place]  [place] [address]

## 2011-09-08 ENCOUNTER — Encounter: Payer: Self-pay | Admitting: Internal Medicine

## 2011-09-08 ENCOUNTER — Ambulatory Visit (INDEPENDENT_AMBULATORY_CARE_PROVIDER_SITE_OTHER): Payer: Medicare Other | Admitting: Internal Medicine

## 2011-09-08 VITALS — BP 120/72 | Temp 98.1°F | Wt 147.0 lb

## 2011-09-08 DIAGNOSIS — E538 Deficiency of other specified B group vitamins: Secondary | ICD-10-CM

## 2011-09-08 DIAGNOSIS — K219 Gastro-esophageal reflux disease without esophagitis: Secondary | ICD-10-CM

## 2011-09-08 DIAGNOSIS — I10 Essential (primary) hypertension: Secondary | ICD-10-CM

## 2011-09-08 DIAGNOSIS — G4700 Insomnia, unspecified: Secondary | ICD-10-CM

## 2011-09-08 MED ORDER — CYANOCOBALAMIN 1000 MCG/ML IJ SOLN
1000.0000 ug | Freq: Once | INTRAMUSCULAR | Status: AC
  Administered 2011-09-08: 1000 ug via INTRAMUSCULAR

## 2011-09-08 NOTE — Progress Notes (Signed)
  Subjective:    Patient ID: [name], female    DOB: 1936-05-13, 75 y.o.   MRN: [id number]  HPI Patient is a 75 year old American female who follows up for hypertension hyperlipidemia reflux and vitamin B12 deficiency.  Her CBC differential was stable at the last visit she gets injections for B12 and takes oral B12 medications.  Her GERD is stable as long she takes her PPI.  Her blood pressure appears to be stable next control her weight is stable she is exercising regularly   Review of Systems  Constitutional: Negative for activity change, appetite change and fatigue.  HENT: Negative for ear pain, congestion, neck pain, postnasal drip and sinus pressure.   Eyes: Negative for redness and visual disturbance.  Respiratory: Negative for cough, shortness of breath and wheezing.   Gastrointestinal: Negative for abdominal pain and abdominal distention.  Genitourinary: Negative for dysuria, frequency and menstrual problem.  Musculoskeletal: Negative for myalgias, joint swelling and arthralgias.  Skin: Negative for rash and wound.  Neurological: Negative for dizziness, weakness and headaches.  Hematological: Negative for adenopathy. Does not bruise/bleed easily.  Psychiatric/Behavioral: Negative for sleep disturbance and decreased concentration.       Objective:   Physical Exam  Nursing note reviewed. Constitutional: She is oriented to person, place, and time. She appears well-developed and well-nourished. No distress.  HENT:  Head: Normocephalic and atraumatic.  Right Ear: External ear normal.  Left Ear: External ear normal.  Nose: Nose normal.  Mouth/Throat: Oropharynx is clear and moist.  Eyes: Conjunctivae and EOM are normal. Pupils are equal, round, and reactive to light.  Neck: Normal range of motion. Neck supple. No JVD present. No tracheal deviation present. No thyromegaly present.  Cardiovascular: Normal rate, regular rhythm, normal heart sounds and intact distal pulses.    No murmur heard. Pulmonary/Chest: Effort normal and breath sounds normal. She has no wheezes. She exhibits no tenderness.  Abdominal: Soft. Bowel sounds are normal.  Musculoskeletal: Normal range of motion. She exhibits no edema and no tenderness.  Lymphadenopathy:    She has no cervical adenopathy.  Neurological: She is alert and oriented to person, place, and time. She has normal reflexes. No cranial nerve deficit.  Skin: Skin is warm and dry. She is not diaphoretic.  Psychiatric: She has a normal mood and affect. Her behavior is normal.          Assessment & Plan:   stable asthma and blood pressure on current medications.  Stable GERD but her Protonix is somewhat expensive samples of a PPI were given to the patient ate her and her compliance.  A B12 shot will be administered today for B12 deficiency she'll continue the oral medications we reviewed her med list and eliminated vitamin E

## 2011-09-08 NOTE — Progress Notes (Signed)
Addended by: Beverely Low on: 09/08/2011 11:08 AM   Modules accepted: Orders

## 2011-10-01 ENCOUNTER — Ambulatory Visit (INDEPENDENT_AMBULATORY_CARE_PROVIDER_SITE_OTHER): Payer: Medicare Other

## 2011-10-01 DIAGNOSIS — Z23 Encounter for immunization: Secondary | ICD-10-CM

## 2011-10-09 ENCOUNTER — Other Ambulatory Visit: Payer: Self-pay | Admitting: Internal Medicine

## 2011-12-04 ENCOUNTER — Other Ambulatory Visit: Payer: Self-pay | Admitting: Cardiovascular Disease

## 2011-12-11 ENCOUNTER — Ambulatory Visit: Payer: PRIVATE HEALTH INSURANCE | Admitting: Internal Medicine

## 2012-01-12 ENCOUNTER — Ambulatory Visit (INDEPENDENT_AMBULATORY_CARE_PROVIDER_SITE_OTHER): Payer: Medicare Other | Admitting: Internal Medicine

## 2012-01-12 ENCOUNTER — Encounter: Payer: Self-pay | Admitting: Internal Medicine

## 2012-01-12 VITALS — BP 130/70 | HR 72 | Temp 98.0°F | Resp 16 | Ht 66.0 in | Wt 150.0 lb

## 2012-01-12 DIAGNOSIS — E785 Hyperlipidemia, unspecified: Secondary | ICD-10-CM

## 2012-01-12 DIAGNOSIS — M21619 Bunion of unspecified foot: Secondary | ICD-10-CM

## 2012-01-12 DIAGNOSIS — I10 Essential (primary) hypertension: Secondary | ICD-10-CM

## 2012-01-12 NOTE — Progress Notes (Signed)
Subjective:    Patient ID: [name], female    DOB: [birth year], 76 y.o.   MRN: [id number]  HPI  Patient is 76 year old American female who is followed for hypertension hyperlipidemia and gastroesophageal reflux she has done remarkably well since her last visit her weight is stable her blood pressure is excellent she has been exercising.  She has a history of essential hypertension and has been on chronic medication with Lopressor.  She also has a history history of asthma and has been on Qvar she has been stable on her medication  Review of Systems  Constitutional: Negative for activity change, appetite change and fatigue.  HENT: Negative for ear pain, congestion, neck pain, postnasal drip and sinus pressure.   Eyes: Negative for redness and visual disturbance.  Respiratory: Negative for cough, shortness of breath and wheezing.   Gastrointestinal: Negative for abdominal pain and abdominal distention.  Genitourinary: Negative for dysuria, frequency and menstrual problem.  Musculoskeletal: Negative for myalgias, joint swelling and arthralgias.  Skin: Negative for rash and wound.  Neurological: Negative for dizziness, weakness and headaches.  Hematological: Negative for adenopathy. Does not bruise/bleed easily.  Psychiatric/Behavioral: Negative for sleep disturbance and decreased concentration.   Past Medical History  Diagnosis Date  . VITAMIN B12 DEFICIENCY 09/02/2007  . HYPERLIPIDEMIA 12/09/2007  . ESSENTIAL HYPERTENSION 10/29/2010  . RBBB 09/05/2010  . VENTRICULAR HYPERTROPHY, LEFT 08/12/2010  . GERD 09/02/2007  . DIVERTICULAR DISEASE 09/03/2010  . CHOLELITHIASIS 09/03/2010  . GALLSTONE PANCREATITIS 09/03/2010  . ARTHRITIS 09/03/2010  . OSTEOPOROSIS 05/17/2009  . COLONIC POLYPS, HX OF 06/02/2008  . HEART MURMUR, HX OF 08/12/2010  . DYSPHAGIA UNSPECIFIED 09/03/2010  . Palpitations 09/03/2010    event monitor reveals long RP tachycardia, likely sinus tachycardia  . WEIGHT LOSS, ABNORMAL 06/02/2008   . Hiatal hernia   . Hematuria   . Polyuria   . Insomnia   . Paresthesia   . Lichen simplex chronicus   . Anxiety state     unspecified  . Low back pain   . Fibromyalgia   . Osteopenia   . Asthma   . Hx of colonoscopy     History   Social History  . Marital Status: Widowed    Spouse Name: N/A    Number of Children: 6  . Years of Education: N/A   Occupational History  . retired    Social History Main Topics  . Smoking status: Never Smoker   . Smokeless tobacco: Never Used  . Alcohol Use: No  . Drug Use: No  . Sexually Active: No   Other Topics Concern  . Not on file   Social History Narrative   Lives in [place]    Past Surgical History  Procedure Date  . Cholecystectomy   . Appendectomy   . Spine surgery     cervical fusion  . Abdominal hysterectomy     THA and O  . Esophagogastroduodenoscopy     with HH and esophagitis  . Oophorectomy   . Neck surgery     Family History  Problem Relation Age of Onset  . Heart disease Mother   . Stroke Mother   . Prostate cancer Father     Allergies  Allergen Reactions  . Celebrex (Celecoxib) Rash    REACTION: rash    Current Outpatient Prescriptions on File Prior to Visit  Medication Sig Dispense Refill  . ALPRAZolam (XANAX) 1 MG tablet TAKE ONE TABLET BY MOUTH TWICE DAILY  60 tablet  3  .  beclomethasone (QVAR) 80 MCG/ACT inhaler Inhale 2 puffs into the lungs as needed.        . budesonide (RHINOCORT AQUA) 32 MCG/ACT nasal spray 1 spray by Nasal route daily as needed.        . Calcium Carbonate-Vitamin D (CALTRATE 600+D) 600-400 MG-UNIT per chew tablet Chew 1 tablet by mouth 2 (two) times daily.        . Coenzyme Q10 (COQ10 PO) Take by mouth daily.        . Echinacea-Vitamin C 250-250 MG CAPS Take by mouth daily.        Marland Kitchen estrogens, conjugated, (PREMARIN) 0.625 MG tablet Take 0.625 mg by mouth daily. Take daily for 21 days then do not take for 7 days.       . Magnesium 250 MG TABS Take 1 mg by mouth  every morning.        . meloxicam (MOBIC) 7.5 MG tablet Take 7.5 mg by mouth daily.        . metoprolol (LOPRESSOR) 50 MG tablet TAKE ONE-HALF TABLET BY MOUTH TWICE DAILY  30 tablet  12  . Multiple Vitamin (MULTIVITAMIN) tablet Take 1 tablet by mouth daily.        . pantoprazole (PROTONIX) 40 MG tablet Take 40 mg by mouth daily.          BP 130/70  Pulse 72  Temp 98 F (36.7 C)  Resp 16  Ht 5\' 6"  (1.676 m)  Wt 150 lb (68.04 kg)  BMI 24.21 kg/m2        Objective:   Physical Exam  Nursing note and vitals reviewed. Constitutional: She is oriented to person, place, and time. She appears well-developed and well-nourished. No distress.  HENT:  Head: Normocephalic and atraumatic.  Right Ear: External ear normal.  Left Ear: External ear normal.  Nose: Nose normal.  Mouth/Throat: Oropharynx is clear and moist.  Eyes: Conjunctivae and EOM are normal. Pupils are equal, round, and reactive to light.  Neck: Normal range of motion. Neck supple. No JVD present. No tracheal deviation present. No thyromegaly present.  Cardiovascular: Normal rate, regular rhythm, normal heart sounds and intact distal pulses.   No murmur heard. Pulmonary/Chest: Effort normal and breath sounds normal. She has no wheezes. She exhibits no tenderness.  Abdominal: Soft. Bowel sounds are normal.  Musculoskeletal: Normal range of motion. She exhibits no edema and no tenderness.  Lymphadenopathy:    She has no cervical adenopathy.  Neurological: She is alert and oriented to person, place, and time. She has normal reflexes. No cranial nerve deficit.  Skin: Skin is warm and dry. She is not diaphoretic.  Psychiatric: She has a normal mood and affect. Her behavior is normal.   She has a bunion on her right foot       Assessment & Plan:  Stable hypertension her current medications no chest pain or signs of any cardiac instability at this time.  Stable asthma on Qvar as her maintenance inhaler continue Qvar samples  given to aid in compliance.  Stable GERD on Protonix due to her history of a hiatal hernia and her symptomology when she stops the medication she will need continued long-term on a PPI this of course influences her risk for osteoporosis and she should have by yearly screening with a bone density  I referred her to a podiatrist for evaluation for the need for surgery on the bunion on her right foot

## 2012-01-12 NOTE — Patient Instructions (Addendum)
Dr Raynald Kemp  For foot surgery

## 2012-01-23 ENCOUNTER — Telehealth: Payer: Self-pay | Admitting: Cardiovascular Disease

## 2012-01-23 NOTE — Telephone Encounter (Signed)
SPOKE WITH PT C/O OF RASH  COMING AND GOING LASTS  COUPLE OF DAYS THEN GOES AWAY  USUALLY OCCURS WEEKLY THIS HAS BEEN GOING ON SINCE AUGUST 2012  HAS SEEN DERMATOLOGY IN PAST  FOR   SAME  COMPLAINT INSTRUCTED TO CALL DERMATOLOGISTS FOR  EVAL AND TX.  PT THOUGHT MAY BE COMING FROM LOPRESSOR HAS BEEN ON LOPRESSOR FOR A YEAR   PT  VERBALIZED UNDERSTANDING.

## 2012-01-23 NOTE — Telephone Encounter (Signed)
New Msg: pt calling wanting to speak with nurse regarding adverse reaction associated with pt lopressor. Pt said she has a rash that comes and goes and there are spots on pt face and bottom part of neck. Pt also c/o of some itching on pt face associated with rash. Pt said symptoms have been present since August 2011. Pt went to dermatologists and he recommended pt put cream on rash for relief. Dermatologists thought rash could be associated with the sun however pt is still noticing the rash even though pt doesn't have much sun exposure. Please return pt call to discuss further.

## 2012-03-11 ENCOUNTER — Other Ambulatory Visit: Payer: Self-pay | Admitting: Gynecology

## 2012-03-11 DIAGNOSIS — Z1231 Encounter for screening mammogram for malignant neoplasm of breast: Secondary | ICD-10-CM

## 2012-04-01 ENCOUNTER — Other Ambulatory Visit: Payer: Self-pay | Admitting: Internal Medicine

## 2012-04-07 ENCOUNTER — Ambulatory Visit
Admission: RE | Admit: 2012-04-07 | Discharge: 2012-04-07 | Disposition: A | Discharge status: Home / Self Care | Payer: Medicare Other | Source: Ambulatory Visit | Attending: Gynecology | Admitting: Gynecology

## 2012-04-07 DIAGNOSIS — Z1231 Encounter for screening mammogram for malignant neoplasm of breast: Secondary | ICD-10-CM

## 2012-05-11 ENCOUNTER — Encounter: Payer: Self-pay | Admitting: Cardiovascular Disease

## 2012-05-11 ENCOUNTER — Encounter: Payer: Self-pay | Admitting: Internal Medicine

## 2012-05-11 ENCOUNTER — Ambulatory Visit (INDEPENDENT_AMBULATORY_CARE_PROVIDER_SITE_OTHER): Payer: Medicare Other | Admitting: Internal Medicine

## 2012-05-11 ENCOUNTER — Ambulatory Visit (INDEPENDENT_AMBULATORY_CARE_PROVIDER_SITE_OTHER): Payer: Medicare Other | Admitting: Cardiovascular Disease

## 2012-05-11 VITALS — BP 110/70 | HR 72 | Temp 98.3°F | Resp 16 | Ht 66.0 in | Wt 156.0 lb

## 2012-05-11 VITALS — BP 119/74 | HR 77 | Ht 66.0 in | Wt 157.0 lb

## 2012-05-11 DIAGNOSIS — R000 Tachycardia, unspecified: Secondary | ICD-10-CM

## 2012-05-11 DIAGNOSIS — J45901 Unspecified asthma with (acute) exacerbation: Secondary | ICD-10-CM

## 2012-05-11 DIAGNOSIS — E785 Hyperlipidemia, unspecified: Secondary | ICD-10-CM

## 2012-05-11 DIAGNOSIS — R252 Cramp and spasm: Secondary | ICD-10-CM

## 2012-05-11 DIAGNOSIS — I4510 Unspecified right bundle-branch block: Secondary | ICD-10-CM

## 2012-05-11 DIAGNOSIS — I10 Essential (primary) hypertension: Secondary | ICD-10-CM

## 2012-05-11 DIAGNOSIS — T887XXA Unspecified adverse effect of drug or medicament, initial encounter: Secondary | ICD-10-CM

## 2012-05-11 LAB — BASIC METABOLIC PANEL
BUN: 11 mg/dL (ref 6–23)
Calcium: 9.1 mg/dL (ref 8.4–10.5)
Chloride: 105 mEq/L (ref 96–112)
Creatinine, Ser: 0.5 mg/dL (ref 0.4–1.2)

## 2012-05-11 LAB — MAGNESIUM: Magnesium: 2.3 mg/dL (ref 1.5–2.5)

## 2012-05-11 MED ORDER — BECLOMETHASONE DIPROPIONATE 80 MCG/ACT IN AERS: 2.0000 | INHALATION_SPRAY | RESPIRATORY_TRACT | Status: DC | PRN

## 2012-05-11 NOTE — Assessment & Plan Note (Signed)
Bifasicular block stable no high grade AV block.  I suspect that baseline bifasicular block makes any kind of ablation higher risk for needing a pacer

## 2012-05-11 NOTE — Progress Notes (Signed)
Patient ID: [name], female   DOB: [birth year], 76 y.o.   MRN: [id number] [name] is seen today for f/U of palpitations  She has had daily palptations when initially seen 2011 . No previous heart history. ? History of murmur. Indicates they are not related to exertion. Made worse by caffeine. ECG 07/1511  [facility] with RBBB. She feels more jittery in general and may be having an anxiety state. Reviewed lab work form 8/15 and TSH was normal. No SSCP dyspnea or syncope. Palpitatons can last minutes and occure daily.   Monitor subsequently documented long R-P tachycardia and patient seen by Dr Johney Frame recently.  Recommended continued beta blocker Rx  ROS: Denies fever, malais, weight loss, blurry vision, decreased visual acuity, cough, sputum, SOB, hemoptysis, pleuritic pain, palpitaitons, heartburn, abdominal pain, melena, lower extremity edema, claudication, or rash.  All other systems reviewed and negative  General: Affect appropriate Healthy:  appears stated age HEENT: normal Neck supple with no adenopathy JVP normal no bruits no thyromegaly Lungs clear with no wheezing and good diaphragmatic motion Heart:  S1/S2 no murmur, no rub, gallop or click PMI normal Abdomen: benighn, BS positve, no tenderness, no AAA no bruit.  No HSM or HJR Distal pulses intact with no bruits No edema Neuro non-focal Skin warm and dry No muscular weakness   Current Outpatient Prescriptions  Medication Sig Dispense Refill  . ALPRAZolam (XANAX) 1 MG tablet TAKE ONE TABLET BY MOUTH TWICE DAILY  60 tablet  3  . beclomethasone (QVAR) 80 MCG/ACT inhaler Inhale 2 puffs into the lungs as needed.  1 Inhaler  11  . budesonide (RHINOCORT AQUA) 32 MCG/ACT nasal spray 1 spray by Nasal route daily as needed.        . Calcium Carbonate-Vitamin D (CALTRATE 600+D) 600-400 MG-UNIT per chew tablet Chew 1 tablet by mouth 2 (two) times daily.        . Coenzyme Q10 (COQ10 PO) Take by mouth daily.        .  Echinacea-Vitamin C 250-250 MG CAPS Take by mouth daily.        Marland Kitchen estradiol (VIVELLE-DOT) 0.0375 MG/24HR Place 1 patch onto the skin 2 (two) times a week.      . Magnesium 250 MG TABS Take 1 mg by mouth every morning.        . meloxicam (MOBIC) 7.5 MG tablet Take 7.5 mg by mouth daily.        . metoprolol (LOPRESSOR) 50 MG tablet TAKE ONE-HALF TABLET BY MOUTH TWICE DAILY  30 tablet  12  . Multiple Vitamin (MULTIVITAMIN) tablet Take 1 tablet by mouth daily.        . pantoprazole (PROTONIX) 40 MG tablet Take 40 mg by mouth daily.        Marland Kitchen DISCONTD: beclomethasone (QVAR) 80 MCG/ACT inhaler Inhale 2 puffs into the lungs as needed.          Allergies  Celebrex  Electrocardiogram:  NSR rate 76 RBBB LAFB no acute changes  Assessment and Plan

## 2012-05-11 NOTE — Patient Instructions (Signed)
Your physician wants you to follow-up in: YEAR WITH DR NISHAN  You will receive a reminder letter in the mail two months in advance. If you don't receive a letter, please call our office to schedule the follow-up appointment.  Your physician recommends that you continue on your current medications as directed. Please refer to the Current Medication list given to you today. 

## 2012-05-11 NOTE — Assessment & Plan Note (Signed)
Documented long R-P tachycardia  Symptoms stable on beta blocker.  F/U with Dr Johney Frame 6 months and me in a year

## 2012-05-11 NOTE — Progress Notes (Signed)
Addended by: Stacie Glaze on: 05/11/2012 11:08 AM   Modules accepted: Orders

## 2012-05-11 NOTE — Assessment & Plan Note (Signed)
Cholesterol is at goal.  Continue current dose of statin and diet Rx.  No myalgias or side effects.  F/U  LFT's in 6 months. Lab Results  Component Value Date   LDLCALC 80 09/13/2009

## 2012-05-11 NOTE — Assessment & Plan Note (Signed)
Well controlled.  Continue current medications and low sodium Dash type diet.    

## 2012-05-11 NOTE — Progress Notes (Signed)
Subjective:    Patient ID: [name], female    DOB: [birth year], 76 y.o.   MRN: [id number]  Hyperlipidemia Pertinent negatives include no myalgias or shortness of breath.  Hypertension Pertinent negatives include no headaches, neck pain or shortness of breath.  Gastrophageal Reflux She reports no abdominal pain, no coughing or no wheezing. Pertinent negatives include no fatigue.   Patient is a 76 year old female who is followed for hypertension hyperlipidemia and history of gastroesophageal reflux she is doing very well her only complaint today is some sensation of itching in her legs bilaterally.  There does not have the characteristics of restless leg it could be dry skin it she also has a history of low magnesium and has not been taking her magnesium recently due to constipation   Review of Systems  Constitutional: Negative for activity change, appetite change and fatigue.  HENT: Negative for ear pain, congestion, neck pain, postnasal drip and sinus pressure.   Eyes: Negative for redness and visual disturbance.  Respiratory: Negative for cough, shortness of breath and wheezing.   Gastrointestinal: Negative for abdominal pain and abdominal distention.  Genitourinary: Negative for dysuria, frequency and menstrual problem.  Musculoskeletal: Negative for myalgias, joint swelling and arthralgias.  Skin: Negative for rash and wound.  Neurological: Negative for dizziness, weakness and headaches.  Hematological: Negative for adenopathy. Does not bruise/bleed easily.  Psychiatric/Behavioral: Negative for sleep disturbance and decreased concentration.   Past Medical History  Diagnosis Date  . VITAMIN B12 DEFICIENCY 09/02/2007  . HYPERLIPIDEMIA 12/09/2007  . ESSENTIAL HYPERTENSION 10/29/2010  . RBBB 09/05/2010  . VENTRICULAR HYPERTROPHY, LEFT 08/12/2010  . GERD 09/02/2007  . DIVERTICULAR DISEASE 09/03/2010  . CHOLELITHIASIS 09/03/2010  . GALLSTONE PANCREATITIS 09/03/2010  . ARTHRITIS 09/03/2010    . OSTEOPOROSIS 05/17/2009  . COLONIC POLYPS, HX OF 06/02/2008  . HEART MURMUR, HX OF 08/12/2010  . DYSPHAGIA UNSPECIFIED 09/03/2010  . Palpitations 09/03/2010    event monitor reveals long RP tachycardia, likely sinus tachycardia  . WEIGHT LOSS, ABNORMAL 06/02/2008  . Hiatal hernia   . Hematuria   . Polyuria   . Insomnia   . Paresthesia   . Lichen simplex chronicus   . Anxiety state     unspecified  . Low back pain   . Fibromyalgia   . Osteopenia   . Asthma   . Hx of colonoscopy     History   Social History  . Marital Status: Widowed    Spouse Name: N/A    Number of Children: 6  . Years of Education: N/A   Occupational History  . retired    Social History Main Topics  . Smoking status: Never Smoker   . Smokeless tobacco: Never Used  . Alcohol Use: No  . Drug Use: No  . Sexually Active: No   Other Topics Concern  . Not on file   Social History Narrative   Lives in [place]    Past Surgical History  Procedure Date  . Cholecystectomy   . Appendectomy   . Spine surgery     cervical fusion  . Abdominal hysterectomy     THA and O  . Esophagogastroduodenoscopy     with HH and esophagitis  . Oophorectomy   . Neck surgery     Family History  Problem Relation Age of Onset  . Heart disease Mother   . Stroke Mother   . Prostate cancer Father     Allergies  Allergen Reactions  . Celebrex (Celecoxib) Rash  REACTION: rash    Current Outpatient Prescriptions on File Prior to Visit  Medication Sig Dispense Refill  . ALPRAZolam (XANAX) 1 MG tablet TAKE ONE TABLET BY MOUTH TWICE DAILY  60 tablet  3  . beclomethasone (QVAR) 80 MCG/ACT inhaler Inhale 2 puffs into the lungs as needed.        . budesonide (RHINOCORT AQUA) 32 MCG/ACT nasal spray 1 spray by Nasal route daily as needed.        . Calcium Carbonate-Vitamin D (CALTRATE 600+D) 600-400 MG-UNIT per chew tablet Chew 1 tablet by mouth 2 (two) times daily.        . Coenzyme Q10 (COQ10 PO) Take by mouth daily.         . Echinacea-Vitamin C 250-250 MG CAPS Take by mouth daily.        Marland Kitchen estradiol (VIVELLE-DOT) 0.0375 MG/24HR Place 1 patch onto the skin 2 (two) times a week.      . Magnesium 250 MG TABS Take 1 mg by mouth every morning.        . meloxicam (MOBIC) 7.5 MG tablet Take 7.5 mg by mouth daily.        . metoprolol (LOPRESSOR) 50 MG tablet TAKE ONE-HALF TABLET BY MOUTH TWICE DAILY  30 tablet  12  . Multiple Vitamin (MULTIVITAMIN) tablet Take 1 tablet by mouth daily.        . pantoprazole (PROTONIX) 40 MG tablet Take 40 mg by mouth daily.          BP 110/70  Pulse 72  Temp 98.3 F (36.8 C)  Resp 16  Ht 5\' 6"  (1.676 m)  Wt 156 lb (70.761 kg)  BMI 25.18 kg/m2        Objective:   Physical Exam  Nursing note and vitals reviewed. Constitutional: She is oriented to person, place, and time. She appears well-developed and well-nourished.  HENT:  Head: Normocephalic and atraumatic.  Neck: Normal range of motion. Neck supple.  Cardiovascular: Normal rate and regular rhythm.   Murmur heard. Abdominal: Soft. Bowel sounds are normal.  Musculoskeletal: Normal range of motion.  Neurological: She is alert and oriented to person, place, and time.  Skin: Skin is warm and dry.          Assessment & Plan:  Patient presents for followup of hypertension hyperlipidemia and allergies.  She has a history of asthma on Qvar and Rhinocort she is stable on those medications.  She is a history of some lower extremity muscular cramping and has not been able to tolerate the magnesium to 2 constipation we suggested using some milk of magnesia.  Her blood pressure stable on her current medications we will monitor magnesium and a basic metabolic panel today and gave her samples of her medications as possible.

## 2012-05-11 NOTE — Patient Instructions (Addendum)
The patient is instructed to continue all medications as prescribed. Schedule followup with check out clerk upon leaving the clinic  

## 2012-09-30 ENCOUNTER — Ambulatory Visit (INDEPENDENT_AMBULATORY_CARE_PROVIDER_SITE_OTHER): Payer: Medicare Other | Admitting: Internal Medicine

## 2012-09-30 DIAGNOSIS — Z23 Encounter for immunization: Secondary | ICD-10-CM

## 2012-10-01 ENCOUNTER — Telehealth: Payer: Self-pay | Admitting: *Deleted

## 2012-10-01 NOTE — Telephone Encounter (Signed)
done

## 2012-10-07 ENCOUNTER — Other Ambulatory Visit: Payer: Self-pay | Admitting: Internal Medicine

## 2012-11-11 ENCOUNTER — Encounter: Payer: Self-pay | Admitting: Internal Medicine

## 2012-11-11 ENCOUNTER — Ambulatory Visit (INDEPENDENT_AMBULATORY_CARE_PROVIDER_SITE_OTHER): Payer: Medicare Other | Admitting: Internal Medicine

## 2012-11-11 VITALS — BP 128/70 | HR 72 | Temp 98.2°F | Resp 16 | Ht 66.0 in | Wt 158.0 lb

## 2012-11-11 DIAGNOSIS — F411 Generalized anxiety disorder: Secondary | ICD-10-CM

## 2012-11-11 DIAGNOSIS — IMO0001 Reserved for inherently not codable concepts without codable children: Secondary | ICD-10-CM

## 2012-11-11 DIAGNOSIS — K219 Gastro-esophageal reflux disease without esophagitis: Secondary | ICD-10-CM

## 2012-11-11 DIAGNOSIS — I10 Essential (primary) hypertension: Secondary | ICD-10-CM

## 2012-11-11 LAB — BASIC METABOLIC PANEL
BUN: 12 mg/dL (ref 6–23)
Chloride: 104 mEq/L (ref 96–112)
GFR: 147.92 mL/min (ref >60.00)
Potassium: 3.8 mEq/L (ref 3.5–5.1)
Sodium: 136 mEq/L (ref 135–145)

## 2012-11-11 MED ORDER — MELOXICAM 7.5 MG PO TABS: 7.5000 mg | ORAL_TABLET | Freq: Every day | ORAL | Status: DC

## 2012-11-11 NOTE — Progress Notes (Signed)
Subjective:    Patient ID: [name], female    DOB: Aug 11, 1936, 76 y.o.   MRN: [id number]  HPI  Follow up for HTN GERD and asthma Stable on all conditions  Review of Systems  Constitutional: Negative for activity change, appetite change and fatigue.  HENT: Negative for ear pain, congestion, neck pain, postnasal drip and sinus pressure.   Eyes: Negative for redness and visual disturbance.  Respiratory: Negative for cough, shortness of breath and wheezing.   Gastrointestinal: Negative for abdominal pain and abdominal distention.  Genitourinary: Negative for dysuria, frequency and menstrual problem.  Musculoskeletal: Negative for myalgias, joint swelling and arthralgias.  Skin: Negative for rash and wound.  Neurological: Negative for dizziness, weakness and headaches.  Hematological: Negative for adenopathy. Does not bruise/bleed easily.  Psychiatric/Behavioral: Negative for sleep disturbance and decreased concentration.   Past Medical History  Diagnosis Date  . VITAMIN B12 DEFICIENCY 09/02/2007  . HYPERLIPIDEMIA 12/09/2007  . ESSENTIAL HYPERTENSION 10/29/2010  . RBBB 09/05/2010  . VENTRICULAR HYPERTROPHY, LEFT 08/12/2010  . GERD 09/02/2007  . DIVERTICULAR DISEASE 09/03/2010  . CHOLELITHIASIS 09/03/2010  . GALLSTONE PANCREATITIS 09/03/2010  . ARTHRITIS 09/03/2010  . OSTEOPOROSIS 05/17/2009  . COLONIC POLYPS, HX OF 06/02/2008  . HEART MURMUR, HX OF 08/12/2010  . DYSPHAGIA UNSPECIFIED 09/03/2010  . Palpitations 09/03/2010    event monitor reveals long RP tachycardia, likely sinus tachycardia  . WEIGHT LOSS, ABNORMAL 06/02/2008  . Hiatal hernia   . Hematuria   . Polyuria   . Insomnia   . Paresthesia   . Lichen simplex chronicus   . Anxiety state     unspecified  . Low back pain   . Fibromyalgia   . Osteopenia   . Asthma   . Hx of colonoscopy     History   Social History  . Marital Status: Widowed    Spouse Name: N/A    Number of Children: 6  . Years of Education: N/A    Occupational History  . retired    Social History Main Topics  . Smoking status: Never Smoker   . Smokeless tobacco: Never Used  . Alcohol Use: No  . Drug Use: No  . Sexually Active: No   Other Topics Concern  . Not on file   Social History Narrative   Lives in [place]    Past Surgical History  Procedure Date  . Cholecystectomy   . Appendectomy   . Spine surgery     cervical fusion  . Abdominal hysterectomy     THA and O  . Esophagogastroduodenoscopy     with HH and esophagitis  . Oophorectomy   . Neck surgery     Family History  Problem Relation Age of Onset  . Heart disease Mother   . Stroke Mother   . Prostate cancer Father     Allergies  Allergen Reactions  . Celebrex (Celecoxib) Rash    REACTION: rash    Current Outpatient Prescriptions on File Prior to Visit  Medication Sig Dispense Refill  . ALPRAZolam (XANAX) 1 MG tablet TAKE ONE TABLET BY MOUTH TWICE DAILY  60 tablet  3  . beclomethasone (QVAR) 80 MCG/ACT inhaler Inhale 2 puffs into the lungs as needed.  1 Inhaler  11  . budesonide (RHINOCORT AQUA) 32 MCG/ACT nasal spray 1 spray by Nasal route daily as needed.        . Calcium Carbonate-Vitamin D (CALTRATE 600+D) 600-400 MG-UNIT per chew tablet Chew 1 tablet by mouth 2 (two) times  daily.        . Coenzyme Q10 (COQ10 PO) Take by mouth daily.        . Echinacea-Vitamin C 250-250 MG CAPS Take by mouth daily.        Marland Kitchen estradiol (VIVELLE-DOT) 0.0375 MG/24HR Place 1 patch onto the skin 2 (two) times a week.      . Magnesium 250 MG TABS Take 1 mg by mouth every morning.        . metoprolol (LOPRESSOR) 50 MG tablet TAKE ONE-HALF TABLET BY MOUTH TWICE DAILY  30 tablet  12  . Multiple Vitamin (MULTIVITAMIN) tablet Take 1 tablet by mouth daily.        . pantoprazole (PROTONIX) 40 MG tablet Take 40 mg by mouth daily.          BP 128/70  Pulse 72  Temp 98.2 F (36.8 C)  Resp 16  Ht 5\' 6"  (1.676 m)  Wt 158 lb (71.668 kg)  BMI 25.50 kg/m2        Objective:   Physical Exam  Nursing note and vitals reviewed. Constitutional: She is oriented to person, place, and time. She appears well-developed and well-nourished. No distress.  HENT:  Head: Normocephalic and atraumatic.  Right Ear: External ear normal.  Left Ear: External ear normal.  Nose: Nose normal.  Mouth/Throat: Oropharynx is clear and moist.  Eyes: Conjunctivae normal and EOM are normal. Pupils are equal, round, and reactive to light.  Neck: Normal range of motion. Neck supple. No JVD present. No tracheal deviation present. No thyromegaly present.  Cardiovascular: Normal rate, regular rhythm, normal heart sounds and intact distal pulses.   No murmur heard. Pulmonary/Chest: Effort normal and breath sounds normal. She has no wheezes. She exhibits no tenderness.  Abdominal: Soft. Bowel sounds are normal.  Musculoskeletal: Normal range of motion. She exhibits no edema and no tenderness.  Lymphadenopathy:    She has no cervical adenopathy.  Neurological: She is alert and oriented to person, place, and time. She has normal reflexes. No cranial nerve deficit.  Skin: Skin is warm and dry. She is not diaphoretic.  Psychiatric: She has a normal mood and affect. Her behavior is normal.          Assessment & Plan:  Check b-met with HTN and hx of elevated potassium moniter today Stable blood pressure Lipids stable Asthma stable gerd stable

## 2012-11-11 NOTE — Patient Instructions (Signed)
The patient is instructed to continue all medications as prescribed. Schedule followup with check out clerk upon leaving the clinic  

## 2012-12-10 ENCOUNTER — Other Ambulatory Visit: Payer: Self-pay | Admitting: Cardiovascular Disease

## 2012-12-10 MED ORDER — METOPROLOL TARTRATE 50 MG PO TABS: 25.0000 mg | ORAL_TABLET | Freq: Two times a day (BID) | ORAL | Status: DC

## 2013-01-10 NOTE — Progress Notes (Signed)
  Subjective:    Patient ID: [name], female    DOB: 1936-10-03, 76 y.o.   MRN: [id number]  HPI opened for a flu shot   Review of Systems     Objective:   Physical Exam        Assessment & Plan:  Opened in error pt has flu shot only

## 2013-03-16 ENCOUNTER — Telehealth: Payer: Self-pay | Admitting: Internal Medicine

## 2013-03-16 NOTE — Telephone Encounter (Signed)
Patient came in today wanting to know if Dr. Lovell Sheehan knows of a great female gynecologist for her, since hers retired (Dr. Nicholas Lose).  If you have any suggestions please call the patient back. Thank you so much.

## 2013-03-17 NOTE — Telephone Encounter (Signed)
Pt i nfornmed [name], and [name]

## 2013-03-21 ENCOUNTER — Other Ambulatory Visit: Payer: Self-pay

## 2013-03-21 DIAGNOSIS — Z1231 Encounter for screening mammogram for malignant neoplasm of breast: Secondary | ICD-10-CM

## 2013-04-11 ENCOUNTER — Other Ambulatory Visit: Payer: Self-pay | Admitting: Internal Medicine

## 2013-04-18 ENCOUNTER — Ambulatory Visit: Payer: Medicare Other

## 2013-04-26 ENCOUNTER — Ambulatory Visit
Admission: RE | Admit: 2013-04-26 | Discharge: 2013-04-26 | Disposition: A | Discharge status: Home / Self Care | Payer: Medicare Other | Source: Ambulatory Visit

## 2013-04-26 DIAGNOSIS — Z1231 Encounter for screening mammogram for malignant neoplasm of breast: Secondary | ICD-10-CM

## 2013-05-11 ENCOUNTER — Ambulatory Visit: Payer: Medicare Other | Admitting: Internal Medicine

## 2013-05-12 ENCOUNTER — Encounter: Payer: Self-pay | Admitting: Internal Medicine

## 2013-05-12 ENCOUNTER — Ambulatory Visit (INDEPENDENT_AMBULATORY_CARE_PROVIDER_SITE_OTHER): Payer: Medicare Other | Admitting: Internal Medicine

## 2013-05-12 VITALS — BP 120/70 | HR 72 | Temp 98.0°F | Resp 16 | Ht 66.0 in | Wt 154.0 lb

## 2013-05-12 DIAGNOSIS — IMO0001 Reserved for inherently not codable concepts without codable children: Secondary | ICD-10-CM

## 2013-05-12 DIAGNOSIS — T887XXA Unspecified adverse effect of drug or medicament, initial encounter: Secondary | ICD-10-CM

## 2013-05-12 DIAGNOSIS — E785 Hyperlipidemia, unspecified: Secondary | ICD-10-CM

## 2013-05-12 DIAGNOSIS — I10 Essential (primary) hypertension: Secondary | ICD-10-CM

## 2013-05-12 LAB — BASIC METABOLIC PANEL
CO2: 26 mEq/L (ref 19–32)
Calcium: 8.7 mg/dL (ref 8.4–10.5)
Chloride: 104 mEq/L (ref 96–112)
Glucose, Bld: 82 mg/dL (ref 70–99)
Sodium: 138 mEq/L (ref 135–145)

## 2013-05-12 LAB — CBC WITH DIFFERENTIAL/PLATELET
Basophils Relative: 0.5 % (ref 0.0–3.0)
Eosinophils Relative: 8.3 % — ABNORMAL HIGH (ref 0.0–5.0)
HCT: 38.3 % (ref 36.0–46.0)
Hemoglobin: 12.8 g/dL (ref 12.0–15.0)
Lymphs Abs: 2 10*3/uL (ref 0.7–4.0)
MCV: 88.4 fl (ref 78.0–100.0)
Monocytes Absolute: 0.5 10*3/uL (ref 0.1–1.0)
Monocytes Relative: 8.6 % (ref 3.0–12.0)
Neutro Abs: 2.8 10*3/uL (ref 1.4–7.7)
Platelets: 265 10*3/uL (ref 150.0–400.0)
RBC: 4.33 Mil/uL (ref 3.87–5.11)
WBC: 5.7 10*3/uL (ref 4.5–10.5)

## 2013-05-12 LAB — LIPID PANEL
HDL: 61.7 mg/dL (ref >39.00)
Total CHOL/HDL Ratio: 2

## 2013-05-12 LAB — HEPATIC FUNCTION PANEL
ALT: 21 U/L (ref 0–35)
AST: 17 U/L (ref 0–37)
Albumin: 3.6 g/dL (ref 3.5–5.2)
Alkaline Phosphatase: 91 U/L (ref 39–117)
Bilirubin, Direct: 0 mg/dL (ref 0.0–0.3)
Total Protein: 6.7 g/dL (ref 6.0–8.3)

## 2013-05-12 NOTE — Progress Notes (Signed)
Subjective:    Patient ID: [name], female    DOB: 06/03/1936, 77 y.o.   MRN: [id number]  Hypertension Pertinent negatives include no headaches, neck pain or shortness of breath.  Hyperlipidemia Pertinent negatives include no myalgias or shortness of breath.  Gastrophageal Reflux She reports no abdominal pain, no coughing or no wheezing. Pertinent negatives include no fatigue.  Asthma There is no cough, shortness of breath or wheezing. Pertinent negatives include no appetite change, ear pain, headaches, myalgias or postnasal drip. Her past medical history is significant for asthma.   Patient is a healthy 77 year old female who is followed for hypertension History of colon polyps with colonoscopy in 2009 a repeat colonoscopy due in 2014 in the fall A history of fibromyalgia controlled and a history of hyperlipidemia.     Review of Systems  Constitutional: Negative for activity change, appetite change and fatigue.  HENT: Negative for ear pain, congestion, neck pain, postnasal drip and sinus pressure.   Eyes: Negative for redness and visual disturbance.  Respiratory: Negative for cough, shortness of breath and wheezing.   Gastrointestinal: Negative for abdominal pain and abdominal distention.  Genitourinary: Negative for dysuria, frequency and menstrual problem.  Musculoskeletal: Negative for myalgias, joint swelling and arthralgias.  Skin: Negative for rash and wound.  Neurological: Negative for dizziness, weakness and headaches.  Hematological: Negative for adenopathy. Does not bruise/bleed easily.  Psychiatric/Behavioral: Negative for sleep disturbance and decreased concentration.   Past Medical History  Diagnosis Date  . VITAMIN B12 DEFICIENCY 09/02/2007  . HYPERLIPIDEMIA 12/09/2007  . ESSENTIAL HYPERTENSION 10/29/2010  . RBBB 09/05/2010  . VENTRICULAR HYPERTROPHY, LEFT 08/12/2010  . GERD 09/02/2007  . DIVERTICULAR DISEASE 09/03/2010  . CHOLELITHIASIS 09/03/2010  . GALLSTONE  PANCREATITIS 09/03/2010  . ARTHRITIS 09/03/2010  . OSTEOPOROSIS 05/17/2009  . COLONIC POLYPS, HX OF 06/02/2008  . HEART MURMUR, HX OF 08/12/2010  . DYSPHAGIA UNSPECIFIED 09/03/2010  . Palpitations 09/03/2010    event monitor reveals long RP tachycardia, likely sinus tachycardia  . WEIGHT LOSS, ABNORMAL 06/02/2008  . Hiatal hernia   . Hematuria   . Polyuria   . Insomnia   . Paresthesia   . Lichen simplex chronicus   . Anxiety state     unspecified  . Low back pain   . Fibromyalgia   . Osteopenia   . Asthma   . Hx of colonoscopy     History   Social History  . Marital Status: Widowed    Spouse Name: N/A    Number of Children: 6  . Years of Education: N/A   Occupational History  . retired    Social History Main Topics  . Smoking status: Never Smoker   . Smokeless tobacco: Never Used  . Alcohol Use: No  . Drug Use: No  . Sexually Active: No   Other Topics Concern  . Not on file   Social History Narrative   Lives in [place]    Past Surgical History  Procedure Laterality Date  . Cholecystectomy    . Appendectomy    . Spine surgery      cervical fusion  . Abdominal hysterectomy      THA and O  . Esophagogastroduodenoscopy      with HH and esophagitis  . Oophorectomy    . Neck surgery      Family History  Problem Relation Age of Onset  . Heart disease Mother   . Stroke Mother   . Prostate cancer Father     Allergies  Allergen Reactions  . Celebrex (Celecoxib) Rash    REACTION: rash    Current Outpatient Prescriptions on File Prior to Visit  Medication Sig Dispense Refill  . ALPRAZolam (XANAX) 1 MG tablet TAKE ONE TABLET BY MOUTH TWICE DAILY  60 tablet  0  . beclomethasone (QVAR) 80 MCG/ACT inhaler Inhale 2 puffs into the lungs as needed.  1 Inhaler  11  . budesonide (RHINOCORT AQUA) 32 MCG/ACT nasal spray 1 spray by Nasal route daily as needed.        . Calcium Carbonate-Vitamin D (CALTRATE 600+D) 600-400 MG-UNIT per chew tablet Chew 1 tablet by mouth 2  (two) times daily.        . Coenzyme Q10 (COQ10 PO) Take by mouth daily.        . Echinacea-Vitamin C 250-250 MG CAPS Take by mouth daily.        Marland Kitchen estradiol (VIVELLE-DOT) 0.0375 MG/24HR Place 1 patch onto the skin 2 (two) times a week.      . Magnesium 250 MG TABS Take 1 mg by mouth every morning.        . meloxicam (MOBIC) 7.5 MG tablet Take 1 tablet (7.5 mg total) by mouth daily.  30 tablet  11  . metoprolol (LOPRESSOR) 50 MG tablet Take 0.5 tablets (25 mg total) by mouth 2 (two) times daily.  90 tablet  3  . Multiple Vitamin (MULTIVITAMIN) tablet Take 1 tablet by mouth daily.        . pantoprazole (PROTONIX) 40 MG tablet Take 40 mg by mouth daily.         No current facility-administered medications on file prior to visit.    BP 120/70  Pulse 72  Temp(Src) 98 F (36.7 C)  Resp 16  Ht 5\' 6"  (1.676 m)  Wt 154 lb (69.854 kg)  BMI 24.87 kg/m2       Objective:   Physical Exam  Nursing note and vitals reviewed. Constitutional: She is oriented to person, place, and time. She appears well-developed and well-nourished. No distress.  HENT:  Head: Normocephalic and atraumatic.  Right Ear: External ear normal.  Left Ear: External ear normal.  Nose: Nose normal.  Mouth/Throat: Oropharynx is clear and moist.  Eyes: Conjunctivae and EOM are normal. Pupils are equal, round, and reactive to light.  Neck: Normal range of motion. Neck supple. No JVD present. No tracheal deviation present. No thyromegaly present.  Cardiovascular: Normal rate, regular rhythm and intact distal pulses.   Murmur heard. Pulmonary/Chest: Effort normal and breath sounds normal. She has no wheezes. She exhibits no tenderness.  Abdominal: Soft. Bowel sounds are normal.  Musculoskeletal: Normal range of motion. She exhibits no edema and no tenderness.  Lymphadenopathy:    She has no cervical adenopathy.  Neurological: She is alert and oriented to person, place, and time. She has normal reflexes. No cranial nerve  deficit.  Skin: Skin is warm and dry. She is not diaphoretic.          Assessment & Plan:  Today we'll monitor a lipid panel and a liver for her cholesterol control.  We'll check a basic metabolic panel to confirm renal function stable on her blood pressure medications blood pressure is at goal.  She has stable hypertension.  She has fibromyalgia that adequately treated.  We'll make sure that her thyroid is normal in relationship to her hyperlipidemia diagnosis.

## 2013-05-19 ENCOUNTER — Encounter: Payer: Self-pay | Admitting: Cardiovascular Disease

## 2013-05-19 ENCOUNTER — Ambulatory Visit (INDEPENDENT_AMBULATORY_CARE_PROVIDER_SITE_OTHER): Payer: Medicare Other | Admitting: Cardiovascular Disease

## 2013-05-19 VITALS — BP 130/68 | HR 125 | Ht 66.0 in | Wt 154.0 lb

## 2013-05-19 DIAGNOSIS — R000 Tachycardia, unspecified: Secondary | ICD-10-CM

## 2013-05-19 MED ORDER — METOPROLOL TARTRATE 50 MG PO TABS: ORAL_TABLET | ORAL | Status: DC

## 2013-05-19 NOTE — Patient Instructions (Signed)
Your physician wants you to follow-up in:1 year  You will receive a reminder letter in the mail two months in advance. If you don't receive a letter, please call our office to schedule the follow-up appointment.   Your physician has recommended you make the following change in your medication:   INCREASE METOPROLOL TO 50 MG IN THE MORNING AND 25 MG IN THE EVENING

## 2013-05-19 NOTE — Assessment & Plan Note (Signed)
No prolonged palpitations BP and HR still on high side  Increase am lopressor to 50 and pm 25

## 2013-05-19 NOTE — Assessment & Plan Note (Signed)
Well controlled.  Continue current medications and low sodium Dash type diet.    

## 2013-05-19 NOTE — Progress Notes (Signed)
Patient ID: [name], female   DOB: May 10, 1936, 77 y.o.   MRN: [id number] [name] is seen today for f/U of palpitations She has had daily palptations when initially seen 2011 . No previous heart history. ? History of murmur. Indicates they are not related to exertion. Made worse by caffeine. [facility] 07/1511 [facility] NSR with RBBB. She feels more jittery in general and may be having an anxiety state. Reviewed lab work form 8/15 and TSH was normal. No SSCP dyspnea or syncope. Palpitatons can last minutes and occure daily.  Monitor subsequently documented long R-P tachycardia and patient seen by Dr Johney Frame recently. Recommended continued beta blocker Rx  ROS: Denies fever, malais, weight loss, blurry vision, decreased visual acuity, cough, sputum, SOB, hemoptysis, pleuritic pain, palpitaitons, heartburn, abdominal pain, melena, lower extremity edema, claudication, or rash.  All other systems reviewed and negative  General: Affect appropriate Healthy:  appears stated age HEENT: normal Neck supple with no adenopathy JVP normal no bruits no thyromegaly Lungs clear with no wheezing and good diaphragmatic motion Heart:  S1/S2 no murmur, no rub, gallop or click PMI normal Abdomen: benighn, BS positve, no tenderness, no AAA no bruit.  No HSM or HJR Distal pulses intact with no bruits No edema Neuro non-focal Skin warm and dry No muscular weakness   Current Outpatient Prescriptions  Medication Sig Dispense Refill  . ALPRAZolam (XANAX) 1 MG tablet TAKE ONE TABLET BY MOUTH TWICE DAILY  60 tablet  0  . beclomethasone (QVAR) 80 MCG/ACT inhaler Inhale 2 puffs into the lungs as needed.  1 Inhaler  11  . budesonide (RHINOCORT AQUA) 32 MCG/ACT nasal spray 1 spray by Nasal route daily as needed.        . Calcium Carbonate-Vitamin D (CALTRATE 600+D) 600-400 MG-UNIT per chew tablet Chew 1 tablet by mouth 2 (two) times daily.        . Coenzyme Q10 (COQ10 PO) Take by mouth daily.        .  Echinacea-Vitamin C 250-250 MG CAPS Take by mouth daily.        Marland Kitchen estradiol (VIVELLE-DOT) 0.0375 MG/24HR Place 1 patch onto the skin 2 (two) times a week.      . Magnesium 250 MG TABS Take 1 mg by mouth every morning.        . meloxicam (MOBIC) 7.5 MG tablet Take 1 tablet (7.5 mg total) by mouth daily.  30 tablet  11  . metoprolol (LOPRESSOR) 50 MG tablet Take 0.5 tablets (25 mg total) by mouth 2 (two) times daily.  90 tablet  3  . Multiple Vitamin (MULTIVITAMIN) tablet Take 1 tablet by mouth daily.        . pantoprazole (PROTONIX) 40 MG tablet Take 40 mg by mouth daily.         No current facility-administered medications for this visit.    Allergies  Celebrex  Electrocardiogram:  05/11/12  SR rate 77 LAE RBBB   SR rate 110 RBBB LAFB no change in morphogy compared to 2013  Assessment and Plan

## 2013-05-19 NOTE — Assessment & Plan Note (Signed)
Cholesterol is at goal.  Continue current dose of statin and diet Rx.  No myalgias or side effects.  F/U  LFT's in 6 months. Lab Results  Component Value Date   LDLCALC 74 05/12/2013

## 2013-05-19 NOTE — Assessment & Plan Note (Signed)
ECG today shows no changes compared to 5/13

## 2013-09-05 ENCOUNTER — Encounter: Payer: Self-pay | Admitting: Internal Medicine

## 2013-09-08 ENCOUNTER — Encounter: Payer: Self-pay | Admitting: Internal Medicine

## 2013-09-14 ENCOUNTER — Encounter: Payer: Self-pay | Admitting: Internal Medicine

## 2013-09-14 ENCOUNTER — Ambulatory Visit (INDEPENDENT_AMBULATORY_CARE_PROVIDER_SITE_OTHER): Payer: Medicare Other | Admitting: Internal Medicine

## 2013-09-14 VITALS — BP 120/70 | HR 72 | Temp 98.0°F | Resp 16 | Ht 66.0 in | Wt 136.0 lb

## 2013-09-14 DIAGNOSIS — Z23 Encounter for immunization: Secondary | ICD-10-CM

## 2013-09-14 DIAGNOSIS — J449 Chronic obstructive pulmonary disease, unspecified: Secondary | ICD-10-CM

## 2013-09-14 DIAGNOSIS — Z780 Asymptomatic menopausal state: Secondary | ICD-10-CM

## 2013-09-14 MED ORDER — PAROXETINE MESYLATE 7.5 MG PO CAPS: 1.0000 | ORAL_CAPSULE | Freq: Every day | ORAL | Status: DC

## 2013-09-14 NOTE — Progress Notes (Signed)
Subjective:    Patient ID: [name], female    DOB: November 02, 1936, 77 y.o.   MRN: [id number]  HPI  And having menopausal symptomatology after stopping her estrogen patches.  She was given a trial of Effexor by her GYN but this has not abated the symptoms she's also tried black cohosh she has comorbid findings of hypertension and asthmatic bronchitis over these are stable  Review of Systems  Constitutional: Negative for activity change, appetite change and fatigue.  HENT: Negative for ear pain, congestion, neck pain, postnasal drip and sinus pressure.   Eyes: Negative for redness and visual disturbance.  Respiratory: Negative for cough, shortness of breath and wheezing.   Gastrointestinal: Negative for abdominal pain and abdominal distention.  Genitourinary: Negative for dysuria, frequency and menstrual problem.  Musculoskeletal: Negative for myalgias, joint swelling and arthralgias.  Skin: Negative for rash and wound.  Neurological: Negative for dizziness, weakness and headaches.  Hematological: Negative for adenopathy. Does not bruise/bleed easily.  Psychiatric/Behavioral: Negative for sleep disturbance and decreased concentration.   Past Medical History  Diagnosis Date  . VITAMIN B12 DEFICIENCY 09/02/2007  . HYPERLIPIDEMIA 12/09/2007  . ESSENTIAL HYPERTENSION 10/29/2010  . RBBB 09/05/2010  . VENTRICULAR HYPERTROPHY, LEFT 08/12/2010  . GERD 09/02/2007  . DIVERTICULAR DISEASE 09/03/2010  . CHOLELITHIASIS 09/03/2010  . GALLSTONE PANCREATITIS 09/03/2010  . ARTHRITIS 09/03/2010  . OSTEOPOROSIS 05/17/2009  . COLONIC POLYPS, HX OF 06/02/2008  . HEART MURMUR, HX OF 08/12/2010  . DYSPHAGIA UNSPECIFIED 09/03/2010  . Palpitations 09/03/2010    event monitor reveals long RP tachycardia, likely sinus tachycardia  . WEIGHT LOSS, ABNORMAL 06/02/2008  . Hiatal hernia   . Hematuria   . Polyuria   . Insomnia   . Paresthesia   . Lichen simplex chronicus   . Anxiety state     unspecified  . Low back pain   .  Fibromyalgia   . Osteopenia   . Asthma   . Hx of colonoscopy     History   Social History  . Marital Status: Widowed    Spouse Name: N/A    Number of Children: 6  . Years of Education: N/A   Occupational History  . retired    Social History Main Topics  . Smoking status: Never Smoker   . Smokeless tobacco: Never Used  . Alcohol Use: No  . Drug Use: No  . Sexual Activity: No   Other Topics Concern  . Not on file   Social History Narrative   Lives in [place]    Past Surgical History  Procedure Laterality Date  . Cholecystectomy    . Appendectomy    . Spine surgery      cervical fusion  . Abdominal hysterectomy      THA and O  . Esophagogastroduodenoscopy      with HH and esophagitis  . Oophorectomy    . Neck surgery      Family History  Problem Relation Age of Onset  . Heart disease Mother   . Stroke Mother   . Prostate cancer Father     Allergies  Allergen Reactions  . Celebrex [Celecoxib] Rash    REACTION: rash    Current Outpatient Prescriptions on File Prior to Visit  Medication Sig Dispense Refill  . ALPRAZolam (XANAX) 1 MG tablet TAKE ONE TABLET BY MOUTH TWICE DAILY  60 tablet  0  . beclomethasone (QVAR) 80 MCG/ACT inhaler Inhale 2 puffs into the lungs as needed.  1 Inhaler  11  .  budesonide (RHINOCORT AQUA) 32 MCG/ACT nasal spray 1 spray by Nasal route daily as needed.        . Calcium Carbonate-Vitamin D (CALTRATE 600+D) 600-400 MG-UNIT per chew tablet Chew 1 tablet by mouth 2 (two) times daily.        . Coenzyme Q10 (COQ10 PO) Take by mouth daily.        . Echinacea-Vitamin C 250-250 MG CAPS Take by mouth daily.        . Magnesium 250 MG TABS Take 1 mg by mouth every morning.        . meloxicam (MOBIC) 7.5 MG tablet Take 1 tablet (7.5 mg total) by mouth daily.  30 tablet  11  . metoprolol (LOPRESSOR) 50 MG tablet Take 50 mg (1 Tablet) in the morning and 25 mg ( 1/2 tablet) in the evening  135 tablet  3  . Multiple Vitamin (MULTIVITAMIN)  tablet Take 1 tablet by mouth daily.        . pantoprazole (PROTONIX) 40 MG tablet Take 40 mg by mouth daily.         No current facility-administered medications on file prior to visit.    BP 120/70  Pulse 72  Temp(Src) 98 F (36.7 C)  Resp 16  Ht 5\' 6"  (1.676 m)  Wt 136 lb (61.689 kg)  BMI 21.96 kg/m2       Objective:   Physical Exam  Constitutional: She is oriented to person, place, and time. She appears well-developed and well-nourished. No distress.  HENT:  Head: Normocephalic and atraumatic.  Right Ear: External ear normal.  Left Ear: External ear normal.  Mouth/Throat: Oropharynx is clear and moist.  Eyes: Conjunctivae and EOM are normal. Pupils are equal, round, and reactive to light.  Neck: Normal range of motion. Neck supple. No JVD present. No tracheal deviation present. No thyromegaly present.  Cardiovascular: Normal rate.   Murmur heard. Pulmonary/Chest: Effort normal and breath sounds normal. She has no wheezes. She exhibits no tenderness.  Abdominal: Soft. Bowel sounds are normal.  Musculoskeletal: Normal range of motion. She exhibits no edema and no tenderness.  Lymphadenopathy:    She has no cervical adenopathy.  Neurological: She is alert and oriented to person, place, and time. She has normal reflexes. No cranial nerve deficit.  Skin: Skin is warm and dry. She is not diaphoretic.  Psychiatric: She has a normal mood and affect. Her behavior is normal.          Assessment & Plan:  Stable asthmatic bronchitis and stable hypertension. Symptomatic menopause trial of Paxil 7.5 mg by mouth daily.  Stable asthma on current inhaler samples given of her nail

## 2013-09-22 ENCOUNTER — Encounter: Payer: Self-pay | Admitting: Internal Medicine

## 2013-09-27 ENCOUNTER — Other Ambulatory Visit: Payer: Self-pay | Admitting: Internal Medicine

## 2013-10-13 ENCOUNTER — Telehealth: Payer: Self-pay | Admitting: Internal Medicine

## 2013-10-13 MED ORDER — PAROXETINE HCL 10 MG PO TABS: 10.0000 mg | ORAL_TABLET | ORAL | Status: DC

## 2013-10-13 MED ORDER — PAROXETINE MESYLATE 7.5 MG PO CAPS: 1.0000 | ORAL_CAPSULE | Freq: Every day | ORAL | Status: DC

## 2013-10-13 NOTE — Telephone Encounter (Signed)
Pt would like more samples of brisdelle 7.5mg 

## 2013-10-13 NOTE — Telephone Encounter (Signed)
None available- will send to pharmacy

## 2013-10-13 NOTE — Telephone Encounter (Signed)
brisdelle 7.5 was originally sent in ,but per dr Lovell Sheehan change to paroxetine 10 mg qd.  Left message on machine For pharmacy and paroxetine called in

## 2013-11-16 ENCOUNTER — Ambulatory Visit (AMBULATORY_SURGERY_CENTER): Payer: Self-pay | Admitting: *Deleted

## 2013-11-16 VITALS — Ht 66.0 in | Wt 161.0 lb

## 2013-11-16 DIAGNOSIS — Z860100 Personal history of colon polyps, unspecified: Secondary | ICD-10-CM

## 2013-11-16 MED ORDER — NA SULFATE-K SULFATE-MG SULF 17.5-3.13-1.6 GM/177ML PO SOLN: 1.0000 | Freq: Once | ORAL | Status: DC

## 2013-11-16 NOTE — Progress Notes (Signed)
No allergies to eggs or soy. No problems with anesthesia.  

## 2013-12-08 ENCOUNTER — Ambulatory Visit (AMBULATORY_SURGERY_CENTER): Payer: Medicare Other | Admitting: Internal Medicine

## 2013-12-08 ENCOUNTER — Encounter: Payer: Self-pay | Admitting: Internal Medicine

## 2013-12-08 VITALS — BP 119/65 | HR 55 | Temp 97.7°F | Resp 29 | Ht 66.0 in | Wt 161.0 lb

## 2013-12-08 DIAGNOSIS — Z86010 Personal history of colonic polyps: Secondary | ICD-10-CM

## 2013-12-08 DIAGNOSIS — D126 Benign neoplasm of colon, unspecified: Secondary | ICD-10-CM

## 2013-12-08 DIAGNOSIS — K5730 Diverticulosis of large intestine without perforation or abscess without bleeding: Secondary | ICD-10-CM

## 2013-12-08 MED ORDER — SODIUM CHLORIDE 0.9 % IV SOLN: 500.0000 mL | INTRAVENOUS | Status: DC

## 2013-12-08 NOTE — Progress Notes (Signed)
Called to room to assist during endoscopic procedure.  Patient ID and intended procedure confirmed with present staff. Received instructions for my participation in the procedure from the performing physician.  

## 2013-12-08 NOTE — Patient Instructions (Addendum)
I found and removed one tiny polyp that is benign, I think.  You also have a condition called diverticulosis - common and not usually a problem. Please read the handout provided.  I will let you know pathology results and when to have another routine colonoscopy by mail.  I appreciate the opportunity to care for you. Iva Boop, MD, FACG   YOU HAD AN ENDOSCOPIC PROCEDURE TODAY AT THE [facility]: Refer to the procedure report that was given to you for any specific questions about what was found during the examination.  If the procedure report does not answer your questions, please call your gastroenterologist to clarify.  If you requested that your care partner not be given the details of your procedure findings, then the procedure report has been included in a sealed envelope for you to review at your convenience later.  YOU SHOULD EXPECT: Some feelings of bloating in the abdomen. Passage of more gas than usual.  Walking can help get rid of the air that was put into your GI tract during the procedure and reduce the bloating. If you had a lower endoscopy (such as a colonoscopy or flexible sigmoidoscopy) you may notice spotting of blood in your stool or on the toilet paper. If you underwent a bowel prep for your procedure, then you may not have a normal bowel movement for a few days.  DIET: Your first meal following the procedure should be a light meal and then it is ok to progress to your normal diet.  A half-sandwich or bowl of soup is an example of a good first meal.  Heavy or fried foods are harder to digest and may make you feel nauseous or bloated.  Likewise meals heavy in dairy and vegetables can cause extra gas to form and this can also increase the bloating.  Drink plenty of fluids but you should avoid alcoholic beverages for 24 hours.  ACTIVITY: Your care partner should take you home directly after the procedure.  You should plan to take it easy, moving slowly for the rest  of the day.  You can resume normal activity the day after the procedure however you should NOT DRIVE or use heavy machinery for 24 hours (because of the sedation medicines used during the test).    SYMPTOMS TO REPORT IMMEDIATELY: A gastroenterologist can be reached at any hour.  During normal business hours, 8:30 AM to 5:00 PM Monday through Friday, call [phone].  After hours and on weekends, please call the GI answering service at [phone] who will take a message and have the physician on call contact you.   Following lower endoscopy (colonoscopy or flexible sigmoidoscopy):  Excessive amounts of blood in the stool  Significant tenderness or worsening of abdominal pains  Swelling of the abdomen that is new, acute  Fever of 100F or higher  FOLLOW UP: If any biopsies were taken you will be contacted by phone or by letter within the next 1-3 weeks.  Call your gastroenterologist if you have not heard about the biopsies in 3 weeks.  Our staff will call the home number listed on your records the next business day following your procedure to check on you and address any questions or concerns that you may have at that time regarding the information given to you following your procedure. This is a courtesy call and so if there is no answer at the home number and we have not heard from you through the emergency physician on  call, we will assume that you have returned to your regular daily activities without incident.  SIGNATURES/CONFIDENTIALITY: You and/or your care partner have signed paperwork which will be entered into your electronic medical record.  These signatures attest to the fact that that the information above on your After Visit Summary has been reviewed and is understood.  Full responsibility of the confidentiality of this discharge information lies with you and/or your care-partner.  Please read over handouts about polyps and diverituclosis  Await pathology  Continue your  normal medications

## 2013-12-08 NOTE — Op Note (Signed)
[facility] [address] [place], [address]   COLONOSCOPY PROCEDURE REPORT  PATIENT: [name], [name]  MR#: [id number] BIRTHDATE: 03/17/1936 , 77  yrs. old GENDER: Female ENDOSCOPIST: Iva Boop, MD, [facility] PROCEDURE DATE:  12/08/2013 PROCEDURE:   Colonoscopy with biopsy First Screening Colonoscopy - Avg.  risk and is 50 yrs.  old or older - No.  Prior Negative Screening - Now for repeat screening. N/A  History of Adenoma - Now for follow-up colonoscopy & has been > or = to 3 yrs.  Yes hx of adenoma.  Has been 3 or more years since last colonoscopy.  Polyps Removed Today? Yes. ASA CLASS:   Class III INDICATIONS:Patient's personal history of adenomatous colon polyps.  MEDICATIONS: propofol (Diprivan) 200mg  IV, MAC sedation, administered by CRNA, and These medications were titrated to patient response per physician's verbal order  DESCRIPTION OF PROCEDURE:   After the risks benefits and alternatives of the procedure were thoroughly explained, informed consent was obtained.  A digital rectal exam revealed no abnormalities of the rectum.   The LB [id number]-[id number]  endoscope was introduced through the anus and advanced to the cecum, which was identified by both the appendix and ileocecal valve. No adverse events experienced.   The quality of the prep was excellent using Suprep  The instrument was then slowly withdrawn as the colon was fully examined.   COLON FINDINGS: A sessile polyp measuring 1-2 mm in size was found in the transverse colon.  A polypectomy was performed with cold forceps.  The resection was complete and the polyp tissue was completely retrieved.   Mild diverticulosis was noted throughout the entire examined colon.   The colon mucosa was otherwise normal. Retroflexed views revealed no abnormalities. The time to cecum=2 minutes 14 seconds.  Withdrawal time=8 minutes 34 seconds.  The scope was withdrawn and the procedure completed. COMPLICATIONS:  There were no complications.  ENDOSCOPIC IMPRESSION: 1.   Sessile polyp measuring 1-2 mm in size was found in the transverse colon; polypectomy was performed with cold forceps 2.   Mild diverticulosis was noted throughout the entire examined colon 3.   The colon mucosa was otherwise normal  RECOMMENDATIONS: 1.  Await pathology results 2.   May not need further routine repeat colonoscopy   eSigned:  Iva Boop, MD, [facility] 12/08/2013 9:36 AM   cc: The Patient

## 2013-12-08 NOTE — Progress Notes (Signed)
Report to pacu rn, vss, bbs=clear 

## 2013-12-08 NOTE — Progress Notes (Signed)
Patient did not have preoperative order for IV antibiotic SSI prophylaxis. (G8918)  Patient did not experience any of the following events: a burn prior to discharge; a fall within the facility; wrong site/side/patient/procedure/implant event; or a hospital transfer or hospital admission upon discharge from the facility. (G8907)  

## 2013-12-09 ENCOUNTER — Telehealth: Payer: Self-pay | Admitting: *Deleted

## 2013-12-09 NOTE — Telephone Encounter (Signed)
  Follow up Call-  Call back number 12/08/2013  Post procedure Call Back phone  # [phone]  Permission to leave phone message Yes     Patient questions:  Do you have a fever, pain , or abdominal swelling? no Pain Score  0 *  Have you tolerated food without any problems? yes  Have you been able to return to your normal activities? yes  Do you have any questions about your discharge instructions: Diet   no Medications  no Follow up visit  no  Do you have questions or concerns about your Care? no  Actions: * If pain score is 4 or above: No action needed, pain <4.

## 2013-12-13 ENCOUNTER — Encounter: Payer: Self-pay | Admitting: Internal Medicine

## 2013-12-13 NOTE — Progress Notes (Signed)
Quick Note:  Not a pre-cancerous polyp No recall needed - scope prn signs sxs ______

## 2014-01-23 ENCOUNTER — Encounter: Payer: Self-pay | Admitting: Internal Medicine

## 2014-01-23 ENCOUNTER — Ambulatory Visit (INDEPENDENT_AMBULATORY_CARE_PROVIDER_SITE_OTHER): Payer: Medicare Other | Admitting: Internal Medicine

## 2014-01-23 ENCOUNTER — Ambulatory Visit (INDEPENDENT_AMBULATORY_CARE_PROVIDER_SITE_OTHER)
Admission: RE | Admit: 2014-01-23 | Discharge: 2014-01-23 | Disposition: A | Discharge status: Home / Self Care | Payer: Medicare Other | Source: Ambulatory Visit | Attending: Internal Medicine | Admitting: Internal Medicine

## 2014-01-23 VITALS — BP 110/70 | HR 72 | Temp 98.0°F | Resp 16 | Ht 66.0 in | Wt 159.0 lb

## 2014-01-23 DIAGNOSIS — T887XXA Unspecified adverse effect of drug or medicament, initial encounter: Secondary | ICD-10-CM

## 2014-01-23 DIAGNOSIS — M25519 Pain in unspecified shoulder: Secondary | ICD-10-CM

## 2014-01-23 DIAGNOSIS — E785 Hyperlipidemia, unspecified: Secondary | ICD-10-CM

## 2014-01-23 DIAGNOSIS — I10 Essential (primary) hypertension: Secondary | ICD-10-CM

## 2014-01-23 LAB — BASIC METABOLIC PANEL
BUN: 11 mg/dL (ref 6–23)
CALCIUM: 9.1 mg/dL (ref 8.4–10.5)
CO2: 28 mEq/L (ref 19–32)
Chloride: 103 mEq/L (ref 96–112)
Creatinine, Ser: 0.5 mg/dL (ref 0.4–1.2)
GFR: 144.23 mL/min (ref >60.00)
Glucose, Bld: 100 mg/dL — ABNORMAL HIGH (ref 70–99)
Potassium: 3.6 mEq/L (ref 3.5–5.1)
SODIUM: 138 meq/L (ref 135–145)

## 2014-01-23 LAB — CBC WITH DIFFERENTIAL/PLATELET
BASOS ABS: 0 10*3/uL (ref 0.0–0.1)
Basophils Relative: 0.6 % (ref 0.0–3.0)
Eosinophils Absolute: 0.5 10*3/uL (ref 0.0–0.7)
Eosinophils Relative: 9 % — ABNORMAL HIGH (ref 0.0–5.0)
HCT: 38.9 % (ref 36.0–46.0)
HEMOGLOBIN: 13 g/dL (ref 12.0–15.0)
LYMPHS ABS: 2 10*3/uL (ref 0.7–4.0)
Lymphocytes Relative: 34.6 % (ref 12.0–46.0)
MCHC: 33.3 g/dL (ref 30.0–36.0)
MCV: 88.5 fl (ref 78.0–100.0)
MONO ABS: 0.5 10*3/uL (ref 0.1–1.0)
Monocytes Relative: 8.1 % (ref 3.0–12.0)
NEUTROS ABS: 2.8 10*3/uL (ref 1.4–7.7)
Neutrophils Relative %: 47.7 % (ref 43.0–77.0)
Platelets: 270 10*3/uL (ref 150.0–400.0)
RBC: 4.4 Mil/uL (ref 3.87–5.11)
RDW: 14.3 % (ref 11.5–14.6)
WBC: 5.9 10*3/uL (ref 4.5–10.5)

## 2014-01-23 LAB — HEPATIC FUNCTION PANEL
ALT: 43 U/L — ABNORMAL HIGH (ref 0–35)
AST: 24 U/L (ref 0–37)
Albumin: 3.7 g/dL (ref 3.5–5.2)
Alkaline Phosphatase: 119 U/L — ABNORMAL HIGH (ref 39–117)
BILIRUBIN DIRECT: 0.1 mg/dL (ref 0.0–0.3)
Total Bilirubin: 1 mg/dL (ref 0.3–1.2)
Total Protein: 7.2 g/dL (ref 6.0–8.3)

## 2014-01-23 LAB — TSH: TSH: 1.18 u[IU]/mL (ref 0.35–5.50)

## 2014-01-23 LAB — LIPID PANEL
Cholesterol: 178 mg/dL (ref 0–200)
HDL: 64.7 mg/dL (ref >39.00)
LDL Cholesterol: 104 mg/dL — ABNORMAL HIGH (ref 0–99)
TRIGLYCERIDES: 49 mg/dL (ref 0.0–149.0)
Total CHOL/HDL Ratio: 3
VLDL: 9.8 mg/dL (ref 0.0–40.0)

## 2014-01-23 NOTE — Addendum Note (Signed)
Addended by: Elmer Picker on: 01/23/2014 12:06 PM   Modules accepted: Orders

## 2014-01-23 NOTE — Patient Instructions (Signed)
The patient is instructed to continue all medications as prescribed. Schedule followup with check out clerk upon leaving the clinic  

## 2014-01-23 NOTE — Addendum Note (Signed)
Addended by: Elmer Picker on: 01/23/2014 12:07 PM   Modules accepted: Orders

## 2014-01-23 NOTE — Progress Notes (Signed)
   Subjective:    Patient ID: [name], female    DOB: December 02, 1936, 78 y.o.   MRN: [id number]  HPI The patient had favorable response to the paxil for hot flashes and her anxiety Stable Asthma on Qvar but the drug is expensive Refilled stable medications Had a fall in the shower   Review of Systems  Constitutional: Positive for activity change and fatigue. Negative for appetite change.  HENT: Negative for congestion, ear pain, postnasal drip and sinus pressure.   Eyes: Negative for redness and visual disturbance.  Respiratory: Negative for cough, shortness of breath and wheezing.   Gastrointestinal: Negative for abdominal pain and abdominal distention.  Genitourinary: Positive for frequency. Negative for dysuria and menstrual problem.  Musculoskeletal: Negative for arthralgias, joint swelling, myalgias and neck pain.  Skin: Negative for rash and wound.  Neurological: Positive for weakness. Negative for dizziness and headaches.       Fall  Hematological: Negative for adenopathy. Does not bruise/bleed easily.  Psychiatric/Behavioral: Negative for sleep disturbance and decreased concentration.       Objective:   Physical Exam  Constitutional: She is oriented to person, place, and time. She appears well-developed and well-nourished. No distress.  HENT:  Head: Normocephalic and atraumatic.  Eyes: Conjunctivae and EOM are normal. Pupils are equal, round, and reactive to light.  Neck: Normal range of motion. No JVD present. No tracheal deviation present. No thyromegaly present.  Cardiovascular: Normal rate and regular rhythm.   Murmur heard. Pulmonary/Chest: Effort normal and breath sounds normal. She has no wheezes. She exhibits no tenderness.  Abdominal: Soft. Bowel sounds are normal.  Musculoskeletal: She exhibits tenderness. She exhibits no edema.  Lymphadenopathy:    She has no cervical adenopathy.  Neurological: She is alert and oriented to person, place, and time. She has  normal reflexes. No cranial nerve deficit.  Skin: Skin is warm and dry. She is not diaphoretic.          Assessment & Plan:  Stable hypertension measure a basic metabolic panel.  History of hyperlipidemia measure a lipid and liver.  History of asthmatic bronchitis samples of her asthma maintenance drugs given.  She's been stable on this medication.  Monitoring a CBC for side effect of aspirin.  No current evidence of congestive heart failure

## 2014-01-23 NOTE — Progress Notes (Signed)
Pre visit review using our clinic review tool, if applicable. No additional management support is needed unless otherwise documented below in the visit note. 

## 2014-02-13 ENCOUNTER — Encounter: Payer: Self-pay | Admitting: Internal Medicine

## 2014-03-07 ENCOUNTER — Other Ambulatory Visit: Payer: Self-pay

## 2014-03-07 DIAGNOSIS — Z1231 Encounter for screening mammogram for malignant neoplasm of breast: Secondary | ICD-10-CM

## 2014-03-16 ENCOUNTER — Other Ambulatory Visit: Payer: Self-pay | Admitting: Internal Medicine

## 2014-03-29 ENCOUNTER — Other Ambulatory Visit: Payer: Self-pay | Admitting: Internal Medicine

## 2014-03-29 ENCOUNTER — Telehealth: Payer: Self-pay | Admitting: *Deleted

## 2014-05-04 ENCOUNTER — Ambulatory Visit
Admission: RE | Admit: 2014-05-04 | Discharge: 2014-05-04 | Disposition: A | Discharge status: Home / Self Care | Payer: Medicare Other | Source: Ambulatory Visit

## 2014-05-04 ENCOUNTER — Encounter (INDEPENDENT_AMBULATORY_CARE_PROVIDER_SITE_OTHER): Payer: Self-pay

## 2014-05-04 DIAGNOSIS — Z1231 Encounter for screening mammogram for malignant neoplasm of breast: Secondary | ICD-10-CM

## 2014-05-08 ENCOUNTER — Other Ambulatory Visit: Payer: Self-pay | Admitting: Internal Medicine

## 2014-05-08 DIAGNOSIS — R928 Other abnormal and inconclusive findings on diagnostic imaging of breast: Secondary | ICD-10-CM

## 2014-05-09 ENCOUNTER — Other Ambulatory Visit (HOSPITAL_COMMUNITY): Payer: Self-pay | Admitting: Obstetrics and Gynecology

## 2014-05-09 DIAGNOSIS — E041 Nontoxic single thyroid nodule: Secondary | ICD-10-CM

## 2014-05-10 ENCOUNTER — Ambulatory Visit (HOSPITAL_COMMUNITY)
Admission: RE | Admit: 2014-05-10 | Discharge: 2014-05-10 | Disposition: A | Discharge status: Home / Self Care | Payer: Medicare Other | Source: Ambulatory Visit | Attending: Obstetrics and Gynecology | Admitting: Obstetrics and Gynecology

## 2014-05-10 DIAGNOSIS — E041 Nontoxic single thyroid nodule: Secondary | ICD-10-CM

## 2014-05-10 DIAGNOSIS — E042 Nontoxic multinodular goiter: Secondary | ICD-10-CM | POA: Insufficient documentation

## 2014-05-12 ENCOUNTER — Other Ambulatory Visit: Payer: Self-pay | Admitting: Obstetrics and Gynecology

## 2014-05-12 DIAGNOSIS — E041 Nontoxic single thyroid nodule: Secondary | ICD-10-CM

## 2014-05-17 ENCOUNTER — Other Ambulatory Visit (HOSPITAL_COMMUNITY)
Admission: RE | Admit: 2014-05-17 | Discharge: 2014-05-17 | Disposition: A | Discharge status: Home / Self Care | Payer: Medicare Other | Source: Ambulatory Visit | Attending: Interventional Radiology | Admitting: Interventional Radiology

## 2014-05-17 ENCOUNTER — Ambulatory Visit
Admission: RE | Admit: 2014-05-17 | Discharge: 2014-05-17 | Disposition: A | Discharge status: Home / Self Care | Payer: Medicare Other | Source: Ambulatory Visit | Attending: Internal Medicine | Admitting: Internal Medicine

## 2014-05-17 ENCOUNTER — Ambulatory Visit
Admission: RE | Admit: 2014-05-17 | Discharge: 2014-05-17 | Disposition: A | Discharge status: Home / Self Care | Payer: Medicare Other | Source: Ambulatory Visit | Attending: Obstetrics and Gynecology | Admitting: Obstetrics and Gynecology

## 2014-05-17 DIAGNOSIS — R928 Other abnormal and inconclusive findings on diagnostic imaging of breast: Secondary | ICD-10-CM

## 2014-05-17 DIAGNOSIS — E041 Nontoxic single thyroid nodule: Secondary | ICD-10-CM

## 2014-05-18 ENCOUNTER — Other Ambulatory Visit: Payer: Self-pay | Admitting: Internal Medicine

## 2014-05-23 ENCOUNTER — Ambulatory Visit: Payer: Medicare Other | Admitting: Cardiovascular Disease

## 2014-05-24 ENCOUNTER — Encounter: Payer: Self-pay | Admitting: Cardiovascular Disease

## 2014-05-24 ENCOUNTER — Ambulatory Visit (INDEPENDENT_AMBULATORY_CARE_PROVIDER_SITE_OTHER): Payer: Medicare Other | Admitting: Cardiovascular Disease

## 2014-05-24 VITALS — BP 130/70 | HR 80 | Ht 66.0 in | Wt 161.0 lb

## 2014-05-24 DIAGNOSIS — I4510 Unspecified right bundle-branch block: Secondary | ICD-10-CM

## 2014-05-24 DIAGNOSIS — R002 Palpitations: Secondary | ICD-10-CM

## 2014-05-24 DIAGNOSIS — E785 Hyperlipidemia, unspecified: Secondary | ICD-10-CM

## 2014-05-24 NOTE — Progress Notes (Signed)
Patient ID: [name], female   DOB: 06/24/1936, 78 y.o.   MRN: [id number] [name] is seen today for f/U of palpitations She has had daily palptations when initially seen 2011 . No previous heart history. ? History of murmur. Indicates they are not related to exertion. Made worse by caffeine. [facility] 07[facility]11 [facility]R with RBBB. She feels more jittery in general and may be having an anxiety state. Reviewed lab work form 8/15 and TSH was normal. No SSCP dyspnea or syncope. Palpitatons can last minutes and occure daily.  Monitor subsequently documented long R-P tachycardia and patient seen by Dr Rayann Heman recently. Recommended continued beta blocker Rx  No significant palpitations  Taking lopressor once in am    ROS: Denies fever, malais, weight loss, blurry vision, decreased visual acuity, cough, sputum, SOB, hemoptysis, pleuritic pain, palpitaitons, heartburn, abdominal pain, melena, lower extremity edema, claudication, or rash.  All other systems reviewed and negative  General: Affect appropriate Healthy:  appears stated age 78: normal Neck supple with no adenopathy JVP normal no bruits no thyromegaly Lungs clear with no wheezing and good diaphragmatic motion Heart:  S1/S2 SEM  murmur, no rub, gallop or click PMI normal Abdomen: benighn, BS positve, no tenderness, no AAA no bruit.  No HSM or HJR Distal pulses intact with no bruits No edema Neuro non-focal Skin warm and dry No muscular weakness   Current Outpatient Prescriptions  Medication Sig Dispense Refill  . ALPRAZolam (XANAX) 1 MG tablet TAKE ONE TABLET BY MOUTH TWICE DAILY  60 tablet  3  . beclomethasone (QVAR) 80 MCG/ACT inhaler Inhale 2 puffs into the lungs as needed.  1 Inhaler  11  . budesonide (RHINOCORT AQUA) 32 MCG/ACT nasal spray 1 spray by Nasal route daily as needed.        . Calcium Carbonate-Vitamin D (CALTRATE 600+D) 600-400 MG-UNIT per chew tablet Chew 1 tablet by mouth 2 (two) times daily.        .  Coenzyme Q10 (COQ10 PO) Take by mouth daily.        . Echinacea-Vitamin C 250-250 MG CAPS Take by mouth daily.        . Magnesium 250 MG TABS Take 1 mg by mouth every morning.        . meloxicam (MOBIC) 7.5 MG tablet TAKE ONE TABLET BY MOUTH EVERY DAY  30 tablet  0  . metoprolol (LOPRESSOR) 50 MG tablet Take 50 mg (1 Tablet) in the morning and 25 mg ( 1/2 tablet) in the evening  135 tablet  3  . Multiple Vitamin (MULTIVITAMIN) tablet Take 1 tablet by mouth daily.        . pantoprazole (PROTONIX) 40 MG tablet Take 40 mg by mouth daily.        Marland Kitchen PARoxetine (PAXIL) 10 MG tablet Take 1 tablet (10 mg total) by mouth every morning.  30 tablet  3   No current facility-administered medications for this visit.    Allergies  Celebrex  Electrocardiogram:  SR rate 80 RBBB ? Limb lead reversal   Assessment and Plan

## 2014-05-24 NOTE — Assessment & Plan Note (Signed)
Cholesterol is at goal.  Continue current dose of statin and diet Rx.  No myalgias or side effects.  F/U  LFT's in 6 months. Lab Results  Component Value Date   LDLCALC 104* 01/23/2014

## 2014-05-24 NOTE — Assessment & Plan Note (Signed)
Improved on beta blocker  Documented long R P tachycardia  Improved

## 2014-05-24 NOTE — Patient Instructions (Signed)
Your physician wants you to follow-up in: YEAR WITH DR NISHAN  You will receive a reminder letter in the mail two months in advance. If you don't receive a letter, please call our office to schedule the follow-up appointment.  Your physician recommends that you continue on your current medications as directed. Please refer to the Current Medication list given to you today. 

## 2014-05-24 NOTE — Assessment & Plan Note (Signed)
STable no change on ECG yearly ECG

## 2014-06-15 ENCOUNTER — Other Ambulatory Visit: Payer: Self-pay | Admitting: Cardiovascular Disease

## 2014-07-03 ENCOUNTER — Ambulatory Visit (INDEPENDENT_AMBULATORY_CARE_PROVIDER_SITE_OTHER): Payer: Self-pay | Admitting: Surgery

## 2014-07-19 ENCOUNTER — Ambulatory Visit (INDEPENDENT_AMBULATORY_CARE_PROVIDER_SITE_OTHER): Payer: Medicare Other | Admitting: Surgery

## 2014-07-24 ENCOUNTER — Ambulatory Visit: Payer: Medicare Other | Admitting: Internal Medicine

## 2014-08-01 ENCOUNTER — Telehealth (INDEPENDENT_AMBULATORY_CARE_PROVIDER_SITE_OTHER): Payer: Self-pay

## 2014-08-01 ENCOUNTER — Encounter (INDEPENDENT_AMBULATORY_CARE_PROVIDER_SITE_OTHER): Payer: Self-pay | Admitting: Surgery

## 2014-08-01 ENCOUNTER — Ambulatory Visit (INDEPENDENT_AMBULATORY_CARE_PROVIDER_SITE_OTHER): Payer: Medicare Other | Admitting: Surgery

## 2014-08-01 VITALS — BP 134/78 | HR 71 | Temp 97.3°F | Resp 16 | Ht 66.0 in | Wt 158.2 lb

## 2014-08-01 DIAGNOSIS — E041 Nontoxic single thyroid nodule: Secondary | ICD-10-CM

## 2014-08-01 NOTE — Telephone Encounter (Signed)
Order for thyroid u/s in feb 2016 in epic and to ref coord to set up and call pt.

## 2014-08-01 NOTE — Patient Instructions (Signed)
Thyroid-Stimulating Hormone A thyroid-stimulating hormone (TSH) test is a blood test that is done to measure the level of TSH, also known as thyrotropin, in your blood. Knowing the level of TSH in your blood can help your health care provider:  Diagnose a thyroid gland or pituitary gland disorder.  Manage your condition and treatment if you have hypothyroidism or hyperthyroidism. TSH is produced by the pituitary gland. The pituitary gland is a small organ located just below the brain, behind your eyes and nasal passages. It is part of a system that monitors and maintains thyroid hormone levels and thyroid gland function. Thyroid hormones affect many body parts and systems, including the system that affects how quickly your body burns fuel for energy. Your health care provider may recommend testing your TSH level if you have signs and symptoms that are often associated with abnormal thyroid hormone levels. The blood used for testing is obtained through a needle placed in a vein in your arm. RESULTS It is your responsibility to obtain your test results. Ask the lab or department performing the test when and how you will get your results. Contact your health care provider to discuss any questions you have about your results.  Your health care provider will go over the test results with you and discuss the importance and meaning of your results, as well as the need for additional tests if necessary. Range of Normal Values  Ranges for normal values may vary among different labs and hospitals. You should always check with your health care provider after having lab work or other tests done to discuss whether your values are considered within normal limits.  Adult: 0.3-5 microU/mL or 0.3-5 mU/L (SI units).  Newborn: 3-18 microU/mL or 3-18 mU/L.  Cord: 3-12 microU/mL or 3-12 mU/L. Meaning of Results Outside Normal Value Ranges A high level of TSH may mean:  Your thyroid gland is not making enough  thyroid hormones. When the thyroid gland does not make enough thyroid hormones, the pituitary gland releases TSH into the bloodstream. The higher-than-normal levels of TSH prompt the thyroid gland to release more thyroid hormones.  You are getting an insufficient level of thyroid hormone medicine, if you are receiving this type of treatment.  There is a problem with the pituitary gland (rare). A low level of TSH can indicate a problem with the pituitary gland. Document Released: 01/09/2005 Document Revised: 05/01/2014 Document Reviewed: 11/26/2008 ExitCare Patient Information 2015 [employer]. This information is not intended to replace advice given to you by your health care provider. Make sure you discuss any questions you have with your health care provider.  

## 2014-08-01 NOTE — Progress Notes (Signed)
General Surgery [facility]  Chief Complaint  Patient presents with  . New Evaluation    thyroid nodule - referral from Dr. Arvella Nigh    HISTORY: Patient is a 78 year old female referred by her gynecologist for evaluation of newly diagnosed thyroid nodule. Patient was seen in early May 2015. On routine physical exam she was noted to have a thyroid nodule in the right anterior neck. Ultrasound examination was ordered and showed tiny bilateral nodules within a normal size thyroid gland. Dominant nodule was in the right side of the isthmus measuring 2.0 cm in maximum dimension.  Patient subsequently underwent fine-needle aspiration biopsy. This shows a benign follicular nodule by cytopathology.  Patient has had no prior head or neck surgery. There is no family history of thyroid disease. There is no family history of thyroid cancer. There is no family history of other endocrine neoplasm.  Patient is not on thyroid medication.  Past Medical History  Diagnosis Date  . VITAMIN B12 DEFICIENCY 09/02/2007  . HYPERLIPIDEMIA 12/09/2007  . ESSENTIAL HYPERTENSION 10/29/2010  . RBBB 09/05/2010  . VENTRICULAR HYPERTROPHY, LEFT 08/12/2010  . GERD 09/02/2007  . DIVERTICULAR DISEASE 09/03/2010  . CHOLELITHIASIS 09/03/2010  . GALLSTONE PANCREATITIS 09/03/2010  . ARTHRITIS 09/03/2010  . OSTEOPOROSIS 05/17/2009  . COLONIC POLYPS, HX OF 06/02/2008  . HEART MURMUR, HX OF 08/12/2010  . DYSPHAGIA UNSPECIFIED 09/03/2010  . Palpitations 09/03/2010    event monitor reveals long RP tachycardia, likely sinus tachycardia  . WEIGHT LOSS, ABNORMAL 06/02/2008  . Hiatal hernia   . Hematuria   . Polyuria   . Insomnia   . Paresthesia   . Lichen simplex chronicus   . Anxiety state     unspecified  . Low back pain   . Fibromyalgia   . Osteopenia   . Asthma   . Hx of colonoscopy     Current Outpatient Prescriptions  Medication Sig Dispense Refill  . ALPRAZolam (XANAX) 1 MG tablet TAKE ONE TABLET BY MOUTH  TWICE DAILY  60 tablet  3  . beclomethasone (QVAR) 80 MCG/ACT inhaler Inhale 2 puffs into the lungs as needed.  1 Inhaler  11  . Calcium Carbonate-Vitamin D (CALTRATE 600+D) 600-400 MG-UNIT per chew tablet Chew 1 tablet by mouth 2 (two) times daily.        . Coenzyme Q10 (COQ10 PO) Take by mouth daily.        . Echinacea-Vitamin C 250-250 MG CAPS Take by mouth daily.        Marland Kitchen escitalopram (LEXAPRO) 10 MG tablet       . Magnesium 250 MG TABS Take 1 mg by mouth every morning.        . meloxicam (MOBIC) 7.5 MG tablet TAKE ONE TABLET BY MOUTH EVERY DAY  30 tablet  0  . metoprolol (LOPRESSOR) 50 MG tablet TAKE ONE TABLET BY MOUTH IN THE MORNING AND TAKE ONE-HALF TABLET BY MOUTH  IN THE EVENING - INCREASE IN DOSE  135 tablet  1  . Multiple Vitamin (MULTIVITAMIN) tablet Take 1 tablet by mouth daily.        . budesonide (RHINOCORT AQUA) 32 MCG/ACT nasal spray 1 spray by Nasal route daily as needed.        . pantoprazole (PROTONIX) 40 MG tablet Take 40 mg by mouth daily.         No current facility-administered medications for this visit.    Allergies  Allergen Reactions  . Celebrex [Celecoxib] Rash    REACTION:  rash    Family History  Problem Relation Age of Onset  . Heart disease Mother   . Stroke Mother   . Prostate cancer Father   . Colon cancer Father 73    History   Social History  . Marital Status: Widowed    Spouse Name: N/A    Number of Children: 6  . Years of Education: N/A   Occupational History  . retired    Social History Main Topics  . Smoking status: Never Smoker   . Smokeless tobacco: Never Used  . Alcohol Use: No  . Drug Use: No  . Sexual Activity: No   Other Topics Concern  . None   Social History Narrative   Lives in [place] - PERTINENT POSITIVES ONLY: Denies tremor. Denies palpitations. Denies compressive symptoms. Denies new mass or tenderness.  EXAM: Filed Vitals:   08/01/14 1426  BP: 134/78  Pulse: 71  Temp: 97.3 F  (36.3 C)  Resp: 16    GENERAL: well-developed, well-nourished, no acute distress HEENT: normocephalic; pupils equal and reactive; sclerae clear; dentition good; mucous membranes moist NECK:  Left and right thyroid lobes without palpable abnormalities; palpable nodule right isthmus most notable with swallowing, approximately 1.5 cm, nontender; symmetric on extension; no palpable anterior or posterior cervical lymphadenopathy; no supraclavicular masses; no tenderness CHEST: clear to auscultation bilaterally without rales, rhonchi, or wheezes CARDIAC: regular rate and rhythm without significant murmur; peripheral pulses are full EXT:  non-tender without edema; no deformity NEURO: no gross focal deficits; no sign of tremor   LABORATORY RESULTS: See [facility] ([facility]Epic) for most recent results  RADIOLOGY RESULTS: See [facility] ([facility]-Epic) for most recent results  IMPRESSION: Thyroid nodule, 2.0 cm, right isthmus, with benign cytopathology  PLAN: I reviewed the above information with the patient. I provided her with written literature to review at home.  Patient has a dominant 2 cm thyroid nodule in the right side of the isthmus. This appears benign cytopathology. Patient is not aware of its presence and as had no compressive symptoms. Other nodules within the thyroid are quite small. Thyroid is normal in size overall.  Patient does not require surgical intervention at this point in time. We will ask her to return in 6 months. We will repeat a thyroid ultrasound prior to that office visit.  Earnstine Regal, MD, [facility]  Primary Care Physician: Georgetta Haber, MD

## 2014-08-07 ENCOUNTER — Ambulatory Visit: Payer: Medicare Other | Admitting: Internal Medicine

## 2014-09-27 ENCOUNTER — Ambulatory Visit: Payer: Medicare Other | Admitting: Family Medicine

## 2014-09-27 ENCOUNTER — Ambulatory Visit (INDEPENDENT_AMBULATORY_CARE_PROVIDER_SITE_OTHER): Payer: Medicare Other | Admitting: Family Medicine

## 2014-09-27 DIAGNOSIS — Z23 Encounter for immunization: Secondary | ICD-10-CM

## 2014-10-16 ENCOUNTER — Ambulatory Visit (INDEPENDENT_AMBULATORY_CARE_PROVIDER_SITE_OTHER): Payer: Medicare Other | Admitting: Internal Medicine

## 2014-10-16 ENCOUNTER — Encounter: Payer: Self-pay | Admitting: Internal Medicine

## 2014-10-16 VITALS — BP 112/74 | HR 80 | Temp 97.7°F | Ht 66.0 in | Wt 154.0 lb

## 2014-10-16 DIAGNOSIS — E041 Nontoxic single thyroid nodule: Secondary | ICD-10-CM

## 2014-10-16 DIAGNOSIS — J4520 Mild intermittent asthma, uncomplicated: Secondary | ICD-10-CM

## 2014-10-16 DIAGNOSIS — M129 Arthropathy, unspecified: Secondary | ICD-10-CM

## 2014-10-16 DIAGNOSIS — Z23 Encounter for immunization: Secondary | ICD-10-CM

## 2014-10-16 DIAGNOSIS — E785 Hyperlipidemia, unspecified: Secondary | ICD-10-CM

## 2014-10-16 DIAGNOSIS — J45901 Unspecified asthma with (acute) exacerbation: Secondary | ICD-10-CM

## 2014-10-16 DIAGNOSIS — I10 Essential (primary) hypertension: Secondary | ICD-10-CM

## 2014-10-16 LAB — HEPATIC FUNCTION PANEL
ALT: 24 U/L (ref 0–35)
AST: 21 U/L (ref 0–37)
Albumin: 3.5 g/dL (ref 3.5–5.2)
Alkaline Phosphatase: 119 U/L — ABNORMAL HIGH (ref 39–117)
Bilirubin, Direct: 0 mg/dL (ref 0.0–0.3)
Total Bilirubin: 0.7 mg/dL (ref 0.2–1.2)
Total Protein: 7.5 g/dL (ref 6.0–8.3)

## 2014-10-16 LAB — BASIC METABOLIC PANEL
BUN: 11 mg/dL (ref 6–23)
CO2: 29 mEq/L (ref 19–32)
Calcium: 9.1 mg/dL (ref 8.4–10.5)
Chloride: 103 mEq/L (ref 96–112)
Creatinine, Ser: 0.5 mg/dL (ref 0.4–1.2)
GFR: 140.88 mL/min (ref >60.00)
Glucose, Bld: 99 mg/dL (ref 70–99)
POTASSIUM: 3.5 meq/L (ref 3.5–5.1)
Sodium: 139 mEq/L (ref 135–145)

## 2014-10-16 LAB — CBC WITH DIFFERENTIAL/PLATELET
Basophils Absolute: 0 10*3/uL (ref 0.0–0.1)
Basophils Relative: 0.6 % (ref 0.0–3.0)
Eosinophils Absolute: 0.3 10*3/uL (ref 0.0–0.7)
Eosinophils Relative: 4.7 % (ref 0.0–5.0)
HCT: 40 % (ref 36.0–46.0)
Hemoglobin: 13.1 g/dL (ref 12.0–15.0)
Lymphocytes Relative: 34.5 % (ref 12.0–46.0)
Lymphs Abs: 1.9 10*3/uL (ref 0.7–4.0)
MCHC: 32.9 g/dL (ref 30.0–36.0)
MCV: 88.7 fl (ref 78.0–100.0)
MONO ABS: 0.5 10*3/uL (ref 0.1–1.0)
MONOS PCT: 8.9 % (ref 3.0–12.0)
Neutro Abs: 2.8 10*3/uL (ref 1.4–7.7)
Neutrophils Relative %: 51.3 % (ref 43.0–77.0)
Platelets: 276 10*3/uL (ref 150.0–400.0)
RBC: 4.51 Mil/uL (ref 3.87–5.11)
RDW: 14.4 % (ref 11.5–15.5)
WBC: 5.5 10*3/uL (ref 4.0–10.5)

## 2014-10-16 LAB — LIPID PANEL
CHOLESTEROL: 155 mg/dL (ref 0–200)
HDL: 46 mg/dL (ref >39.00)
LDL CALC: 91 mg/dL (ref 0–99)
NonHDL: 109
TRIGLYCERIDES: 91 mg/dL (ref 0.0–149.0)
Total CHOL/HDL Ratio: 3
VLDL: 18.2 mg/dL (ref 0.0–40.0)

## 2014-10-16 LAB — RHEUMATOID FACTOR: Rhuematoid fact SerPl-aCnc: 10 IU/mL (ref <=14)

## 2014-10-16 LAB — TSH: TSH: 0.27 u[IU]/mL — ABNORMAL LOW (ref 0.35–4.50)

## 2014-10-16 MED ORDER — MELOXICAM 7.5 MG PO TABS: 7.5000 mg | ORAL_TABLET | Freq: Every day | ORAL | Status: DC

## 2014-10-16 MED ORDER — FLUTICASONE PROPIONATE HFA 110 MCG/ACT IN AERO: 2.0000 | INHALATION_SPRAY | Freq: Two times a day (BID) | RESPIRATORY_TRACT | Status: DC

## 2014-10-16 MED ORDER — BECLOMETHASONE DIPROPIONATE 80 MCG/ACT IN AERS: 2.0000 | INHALATION_SPRAY | RESPIRATORY_TRACT | Status: DC | PRN

## 2014-10-16 MED ORDER — ESCITALOPRAM OXALATE 10 MG PO TABS: 10.0000 mg | ORAL_TABLET | Freq: Every day | ORAL | Status: DC

## 2014-10-16 MED ORDER — METOPROLOL TARTRATE 50 MG PO TABS: ORAL_TABLET | ORAL | Status: DC

## 2014-10-16 NOTE — Progress Notes (Signed)
Pre visit review using our clinic review tool, if applicable. No additional management support is needed unless otherwise documented below in the visit note. 

## 2014-10-16 NOTE — Assessment & Plan Note (Signed)
BP: 112/74 mmHg  Well controlled.

## 2014-10-16 NOTE — Assessment & Plan Note (Signed)
Followed by Dr. Harlow Asa.  Next visit scheduled in Feb, 2016.   Fine-needle aspiration performed in May of 2015. It showed benign follicular nodule.

## 2014-10-16 NOTE — Assessment & Plan Note (Signed)
Patient may be experiencing mild exacerbation.  Qvar is cost prohibitive.  Use albuterol rescue inhaler as needed.  Switch for Flovent.

## 2014-10-16 NOTE — Progress Notes (Signed)
Subjective:    Patient ID: [name], female    DOB: [birth year], 77 y.o.   MRN: [id number]  HPI  78 year old African American female to transfer care. Patient previously followed by Dr. Arnoldo Morale. Patient reports chronic history of low back pain and presumed osteoarthritis. She describes morning stiffness but it usually only lasts less than half an hour. She also has history of chronic hand arthritis.     She currently takes Mobic 7.5 mg once daily.Patient denies any adverse effects. Patient also complains of intermittent cough and chest tightness. She reports her symptoms started after receiving influenza vaccine.  Hypertension-stable  Hyperlipidemia-stable  Review of Systems Intermittent cough, she denies significant dyspnea, negative for fever    Past Medical History  Diagnosis Date  . VITAMIN B12 DEFICIENCY 09/02/2007  . HYPERLIPIDEMIA 12/09/2007  . ESSENTIAL HYPERTENSION 10/29/2010  . RBBB 09/05/2010  . VENTRICULAR HYPERTROPHY, LEFT 08/12/2010  . GERD 09/02/2007  . DIVERTICULAR DISEASE 09/03/2010  . CHOLELITHIASIS 09/03/2010  . GALLSTONE PANCREATITIS 09/03/2010  . ARTHRITIS 09/03/2010  . OSTEOPOROSIS 05/17/2009  . COLONIC POLYPS, HX OF 06/02/2008  . HEART MURMUR, HX OF 08/12/2010  . DYSPHAGIA UNSPECIFIED 09/03/2010  . Palpitations 09/03/2010    event monitor reveals long RP tachycardia, likely sinus tachycardia  . WEIGHT LOSS, ABNORMAL 06/02/2008  . Hiatal hernia   . Hematuria   . Polyuria   . Insomnia   . Paresthesia   . Lichen simplex chronicus   . Anxiety state     unspecified  . Low back pain   . Fibromyalgia   . Osteopenia   . Asthma   . Hx of colonoscopy     History   Social History  . Marital Status: Widowed    Spouse Name: N/A    Number of Children: 6  . Years of Education: N/A   Occupational History  . retired    Social History Main Topics  . Smoking status: Never Smoker   . Smokeless tobacco: Never Used  . Alcohol Use: No  . Drug Use: No  . Sexual Activity:  No   Other Topics Concern  . Not on file   Social History Narrative   Lives in [place]    Past Surgical History  Procedure Laterality Date  . Cholecystectomy    . Appendectomy    . Spine surgery      cervical fusion  . Abdominal hysterectomy      THA and O  . Esophagogastroduodenoscopy      with HH and esophagitis  . Oophorectomy    . Neck surgery      Family History  Problem Relation Age of Onset  . Heart disease Mother   . Stroke Mother   . Prostate cancer Father   . Colon cancer Father 83    Allergies  Allergen Reactions  . Celebrex [Celecoxib] Rash    REACTION: rash    Current Outpatient Prescriptions on File Prior to Visit  Medication Sig Dispense Refill  . ALPRAZolam (XANAX) 1 MG tablet TAKE ONE TABLET BY MOUTH TWICE DAILY  60 tablet  3  . beclomethasone (QVAR) 80 MCG/ACT inhaler Inhale 2 puffs into the lungs as needed.  1 Inhaler  11  . budesonide (RHINOCORT AQUA) 32 MCG/ACT nasal spray 1 spray by Nasal route daily as needed.        . Calcium Carbonate-Vitamin D (CALTRATE 600+D) 600-400 MG-UNIT per chew tablet Chew 1 tablet by mouth 2 (two) times daily.        Marland Kitchen  Coenzyme Q10 (COQ10 PO) Take by mouth daily.        . Echinacea-Vitamin C 250-250 MG CAPS Take by mouth daily.        Marland Kitchen escitalopram (LEXAPRO) 10 MG tablet Take 10 mg by mouth daily.       . Magnesium 250 MG TABS Take 1 mg by mouth every morning.        . meloxicam (MOBIC) 7.5 MG tablet TAKE ONE TABLET BY MOUTH EVERY DAY  30 tablet  0  . metoprolol (LOPRESSOR) 50 MG tablet TAKE ONE TABLET BY MOUTH IN THE MORNING AND TAKE ONE-HALF TABLET BY MOUTH  IN THE EVENING - INCREASE IN DOSE  135 tablet  1  . Multiple Vitamin (MULTIVITAMIN) tablet Take 1 tablet by mouth daily.        . pantoprazole (PROTONIX) 40 MG tablet Take 40 mg by mouth daily.         No current facility-administered medications on file prior to visit.    BP 112/74  Pulse 80  Temp(Src) 97.7 F (36.5 C) (Oral)  Ht 5\' 6"  (1.676 m)   Wt 154 lb (69.854 kg)  BMI 24.87 kg/m2    Objective:   Physical Exam  Constitutional: She is oriented to person, place, and time. She appears well-developed and well-nourished. No distress.  HENT:  Head: Normocephalic and atraumatic.  Right Ear: External ear normal.  Left Ear: External ear normal.  Mouth/Throat: Oropharynx is clear and moist.  Eyes: EOM are normal. Pupils are equal, round, and reactive to light. No scleral icterus.  Neck: Neck supple.  Right thyroid nodule No carotid bruit  Cardiovascular: Normal rate, regular rhythm and normal heart sounds.   Pulmonary/Chest: Effort normal. She has no wheezes.  Slight decrease in breath sounds throughout  Abdominal: Soft. Bowel sounds are normal. She exhibits no mass. There is no tenderness.  Musculoskeletal: She exhibits no edema.  Slight prominence of bilateral metacarpophalangeal joints, slight ulnar deviation.  Neurological: She is alert and oriented to person, place, and time. No cranial nerve deficit.  Skin: Skin is warm and dry.  Psychiatric: She has a normal mood and affect. Her behavior is normal.          Assessment & Plan:

## 2014-10-16 NOTE — Assessment & Plan Note (Signed)
78 year old [place] American female with chronic symmetric arthritis and chronic low back pain. Her symptoms suggestive of possible rheumatoid arthritis. Obtain a rheumatoid factor and anti-CCP antibody. Continue Mobic 7.5 mg for now.  Monitor electrolytes and kidney function.  Patient's current pulmonary symptoms may be related. Consider interstitial lung disease. Obtain chest x-ray.

## 2014-10-17 ENCOUNTER — Telehealth: Payer: Self-pay | Admitting: Internal Medicine

## 2014-10-17 LAB — CYCLIC CITRUL PEPTIDE ANTIBODY, IGG: Cyclic Citrullin Peptide Ab: <2 U/mL (ref 0.0–5.0)

## 2014-10-17 NOTE — Telephone Encounter (Signed)
emmi mailed  °

## 2014-10-23 ENCOUNTER — Telehealth: Payer: Self-pay | Admitting: *Deleted

## 2014-10-23 NOTE — Telephone Encounter (Signed)
Pt aware.

## 2014-10-23 NOTE — Telephone Encounter (Signed)
Message copied by Pearletha Forge on Mon Oct 23, 2014  9:59 AM ------      Message from: Rosine Abe      Created: Mon Oct 16, 2014  8:05 PM       Call pt.  I changed Qvar to Flovent.  They are both inhaled corticosteroids.  Hopefully Flovent will be more affordable and effective.            RY ------

## 2014-10-24 ENCOUNTER — Other Ambulatory Visit: Payer: Self-pay | Admitting: Internal Medicine

## 2014-10-24 DIAGNOSIS — E041 Nontoxic single thyroid nodule: Secondary | ICD-10-CM

## 2014-11-20 ENCOUNTER — Telehealth: Payer: Self-pay | Admitting: Internal Medicine

## 2014-11-20 NOTE — Telephone Encounter (Signed)
Ok to RF x 2 

## 2014-11-20 NOTE — Telephone Encounter (Signed)
[facility] [place], [place] [address] [phone] is requesting re-fill on ALPRAZolam (XANAX) 1 MG tablet

## 2014-11-21 MED ORDER — ALPRAZOLAM 1 MG PO TABS: 1.0000 mg | ORAL_TABLET | Freq: Two times a day (BID) | ORAL | Status: DC

## 2014-11-21 NOTE — Telephone Encounter (Signed)
rx called into [facility]

## 2014-12-18 ENCOUNTER — Ambulatory Visit: Payer: Medicare Other | Admitting: Internal Medicine

## 2015-01-15 ENCOUNTER — Ambulatory Visit: Payer: Medicare Other | Admitting: Internal Medicine

## 2015-01-29 ENCOUNTER — Ambulatory Visit (INDEPENDENT_AMBULATORY_CARE_PROVIDER_SITE_OTHER): Payer: Medicare Other | Admitting: Family Medicine

## 2015-01-29 ENCOUNTER — Encounter: Payer: Self-pay | Admitting: Family Medicine

## 2015-01-29 ENCOUNTER — Ambulatory Visit
Admission: RE | Admit: 2015-01-29 | Discharge: 2015-01-29 | Disposition: A | Discharge status: Home / Self Care | Payer: Medicare Other | Source: Ambulatory Visit | Attending: Surgery | Admitting: Surgery

## 2015-01-29 VITALS — BP 112/54 | Temp 97.6°F | Wt 150.0 lb

## 2015-01-29 DIAGNOSIS — B349 Viral infection, unspecified: Secondary | ICD-10-CM | POA: Diagnosis not present

## 2015-01-29 DIAGNOSIS — J329 Chronic sinusitis, unspecified: Secondary | ICD-10-CM

## 2015-01-29 DIAGNOSIS — B9789 Other viral agents as the cause of diseases classified elsewhere: Secondary | ICD-10-CM

## 2015-01-29 DIAGNOSIS — E041 Nontoxic single thyroid nodule: Secondary | ICD-10-CM

## 2015-01-29 DIAGNOSIS — E0510 Thyrotoxicosis with toxic single thyroid nodule without thyrotoxic crisis or storm: Secondary | ICD-10-CM | POA: Diagnosis not present

## 2015-01-29 MED ORDER — PREDNISONE 20 MG PO TABS: ORAL_TABLET | ORAL | Status: DC

## 2015-01-29 NOTE — Patient Instructions (Signed)
Likely viral sinusitis  Glad this has improved so much  Given your lingering symptoms, let's try prednisone to try to reduce remaining pressure  If no improvement, see [place] mid next week.   See [place] sooner if worsens

## 2015-01-29 NOTE — Progress Notes (Signed)
  Garret Reddish, MD Phone: [phone]  Subjective:   [name] is a 79 y.o. year old very pleasant female patient who presents with the following:  Sinus pressure/cough Symptoms started 2 weeks ago with a lot of yellow drainage from nose, sinus pressure in all areas, some feelings of ear fullness. Mild sore throat. Mild productive cough similar to drainage. Fatigued. Some headaches associated with sinus pressure. Used nasal saline spray which helped some. Nothing else tried. Compared to 3 days ago, has improved singificantly but symptoms still lingering.  ROS-no fever, chills, nausea, vomiting, shortness of breath   Past Medical History- HLD, hx heart murmur, fibromyalgia, HTN, asthma  Medications- reviewed and updated Current Outpatient Prescriptions  Medication Sig Dispense Refill  . budesonide (RHINOCORT AQUA) 32 MCG/ACT nasal spray 1 spray by Nasal route daily as needed.      . Calcium Carbonate-Vitamin D (CALTRATE 600+D) 600-400 MG-UNIT per chew tablet Chew 1 tablet by mouth 2 (two) times daily.      . Coenzyme Q10 (COQ10 PO) Take by mouth daily.      . Echinacea-Vitamin C 250-250 MG CAPS Take by mouth daily.      Marland Kitchen escitalopram (LEXAPRO) 10 MG tablet Take 1 tablet (10 mg total) by mouth daily. 90 tablet 1  . fluticasone (FLOVENT HFA) 110 MCG/ACT inhaler Inhale 2 puffs into the lungs 2 (two) times daily. 1 Inhaler 11  . Magnesium 250 MG TABS Take 1 mg by mouth every morning.      . meloxicam (MOBIC) 7.5 MG tablet Take 1 tablet (7.5 mg total) by mouth daily. 90 tablet 0  . metoprolol (LOPRESSOR) 50 MG tablet TAKE ONE TABLET BY MOUTH IN THE MORNING AND TAKE ONE-HALF TABLET BY MOUTH  IN THE EVENING - INCREASE IN DOSE 135 tablet 1  . Multiple Vitamin (MULTIVITAMIN) tablet Take 1 tablet by mouth daily.      . pantoprazole (PROTONIX) 40 MG tablet Take 40 mg by mouth daily.      Marland Kitchen ALPRAZolam (XANAX) 1 MG tablet Take 1 tablet (1 mg total) by mouth 2 (two) times daily. (Patient not  taking: Reported on 01/29/2015) 60 tablet 2   Objective: BP 112/54 mmHg  Temp(Src) 97.6 F (36.4 C)  Wt 150 lb (68.04 kg) Gen: NAD, resting comfortably HEENT: slight sinus tenderness, turbinates erythematous with no drainage, TM normal No lymphadenopathy, oropharynx normal, some dental staining CV: RRR no rubs or gallops Lungs: CTAB no crackles, wheeze, rhonchi Ext: no edema Skin: warm, dry, no rash   Assessment/Plan:  Viral Sinusitis We discussed that technically she meets criteria for bacterial sinusitis with >10 days of symptoms but given there has been great improvement in last few days, possible this is viral or she is clearing it on her own. To give symptomatic relief of pressure, trial prednisone. Return to care if symptoms persist and could consider augmentin. We made this decision after discussing benefits risks of antibiotics vs. Prednisone vs. No rx therapy.   Meds ordered this encounter  Medications  . predniSONE (DELTASONE) 20 MG tablet    Sig: Take 1 pill daily for 5 days, then take 1/2 pill on Saturday and Sunday.    Dispense:  6 tablet    Refill:  0

## 2015-02-12 ENCOUNTER — Telehealth: Payer: Self-pay | Admitting: Internal Medicine

## 2015-02-12 MED ORDER — PREDNISONE 20 MG PO TABS: ORAL_TABLET | ORAL | Status: DC

## 2015-02-12 NOTE — Telephone Encounter (Signed)
May refill x 1, but patient needs to be seen if symptoms persist after this.

## 2015-02-12 NOTE — Telephone Encounter (Signed)
Pt saw Dr Yong Channel for a sinus infection. She said Dr Yong Channel tiold her if she needed a refill to call back and ask for on. Pt is callinf fod a refill for the following med                   Demographics  [name] 79 year old female 07/12/1936  [address] [place] [address] [phone] (H) [phone] (W)  Comm Pref: None    Advanced Directives No data recorded  Problem List  VITAMIN B12 DEFICIENCY   Hyperlipidemia   ANXIETY STATE, UNSPECIFIED   Essential hypertension   RBBB   VENTRICULAR HYPERTROPHY, LEFT   Asthma   GERD   DIVERTICULAR DISEASE   LICHEN SIMPLEX CHRONICUS   Arthropathy   LOW BACK PAIN   FIBROMYALGIA   OSTEOPOROSIS   OSTEOPENIA   INSOMNIA   PARESTHESIA   PALPITATIONS   HEART MURMUR, HX OF   Personal hx of colon adenomas   Hiatal hernia   Tachycardia   Bifascicular block   Thyroid nodule, uninodular, isthmus  Health MaintenancePostponedSoonDueLate       Topic Due Last Communication   TETANUS/TDAP 01/19/1957    ZOSTAVAX 01/19/1998    INFLUENZA VACCINE 07/30/2015    DEXA SCAN Completed    PNEUMOCOCCAL POLYSACCHARIDE VACCINE AGE 34 AND OVER Completed   Reminders and Results None      and Communications PCPs Type  Doe-Hyun Kyra Searles, DO General  Other Patient Care Team Members Relationship  None   Recipients of Past Communications   Clinical Support - 11/16/2013    Shaylynn TARISSA KERIN 11/16/2013 Mail   Date of Birth Jan 10, 1936 Allergies   Rash^ [id number] RVAMP LAUNCH_OPTIONS;1 RVAMP " data-rvlinknum="2">Celebrex [Celecoxib]Rash  Medications Outpatient Medications (13) Hospital Medications (0) Clinic-Administered Medications (0)   ALPRAZolam (XANAX) 1 MG tablet   budesonide (RHINOCORT AQUA) 32 MCG/ACT nasal spray   Calcium Carbonate-Vitamin D (CALTRATE 600+D) 600-400 MG-UNIT per chew tablet   Coenzyme Q10 (COQ10 PO)   Echinacea-Vitamin C 250-250 MG CAPS   escitalopram (LEXAPRO) 10 MG tablet   fluticasone (FLOVENT HFA) 110 MCG/ACT inhaler   Magnesium 250 MG TABS   meloxicam (MOBIC) 7.5 MG tablet   metoprolol (LOPRESSOR) 50 MG tablet   Multiple Vitamin (MULTIVITAMIN) tablet   pantoprazole (PROTONIX) 40 MG tablet   predniSONE (DELTASONE) 20 MG tablet     Preferred Pharmacies    [id number] - [place], [place] - [place] [address] [phone] (Phone) [phone] (Fax)  Immunizations/Injections    Influenza Split 10/11/2012, 10/01/2011  Influenza Whole 09/25/2010, 09/27/2009, 09/30/2008  Influenza,inj,Quad PF,36+ Mos 10/16/2014, 09/27/2014, 09/14/2013  Pneumococcal Polysaccharide-23 08/16/2014  Significant History/Details   Smoking: Never Smoker   Smokeless Tobacco: Never Used  Alcohol: No  1 open order  Preferred Language: English  Specialty CommentsShow AllReport No comments regarding your specialty  Family Comments None                Oxygen Therapy No data recorded                         My Last Outpatient Progress Note You have written no Outpatient Progress Notes for this patient

## 2015-02-12 NOTE — Telephone Encounter (Signed)
Pt called to say she saw Dr Yong Channel for a sinus infection. She is calling back for a refill on  predniSONE (DELTASONE) 20 MG tablet  She said the sinus infection was not all cleared up   [place]

## 2015-02-12 NOTE — Telephone Encounter (Signed)
Is this ok?

## 2015-02-13 NOTE — Telephone Encounter (Signed)
Medication refilled and pt aware

## 2015-03-05 ENCOUNTER — Ambulatory Visit (INDEPENDENT_AMBULATORY_CARE_PROVIDER_SITE_OTHER): Payer: Medicare Other | Admitting: Family Medicine

## 2015-03-05 ENCOUNTER — Encounter: Payer: Self-pay | Admitting: Family Medicine

## 2015-03-05 VITALS — BP 130/70 | HR 73 | Temp 97.5°F | Wt 150.0 lb

## 2015-03-05 DIAGNOSIS — A499 Bacterial infection, unspecified: Secondary | ICD-10-CM | POA: Diagnosis not present

## 2015-03-05 DIAGNOSIS — B9689 Other specified bacterial agents as the cause of diseases classified elsewhere: Secondary | ICD-10-CM

## 2015-03-05 DIAGNOSIS — J329 Chronic sinusitis, unspecified: Secondary | ICD-10-CM

## 2015-03-05 MED ORDER — AMOXICILLIN-POT CLAVULANATE 875-125 MG PO TABS: 1.0000 | ORAL_TABLET | Freq: Two times a day (BID) | ORAL | Status: DC

## 2015-03-05 NOTE — Patient Instructions (Signed)
Some improved but persistent symptoms despite 2 courses of prednisone for sinusitis. This may have a bacterial component given >1 month of symptoms so we will treat with antibiotics x 10 days.

## 2015-03-05 NOTE — Progress Notes (Signed)
  Garret Reddish, MD Phone: [phone]  Subjective:   [name] is a 79 y.o. year old very pleasant female patient who presents with the following:  Sinus infection -Sinus Pressure worse at night but also occurs some in days. Sometimes clear sometimes cloudy sometimes yellow drainage from nose. Total now of > 1 month of symptoms has had 2 courses of prednisone with around 50% improvement and then later worsening. She is on both claritin-D and rhinocort.   ROS- no blurry vision. Drainage down back of throat. Slight cough. Does have some sneezing and watery eyes.    Past Medical History- HTN, asthma, allergic rhinitis  Medications- reviewed and updated Current Outpatient Prescriptions  Medication Sig Dispense Refill  . budesonide (RHINOCORT AQUA) 32 MCG/ACT nasal spray 1 spray by Nasal route daily as needed.      . Calcium Carbonate-Vitamin D (CALTRATE 600+D) 600-400 MG-UNIT per chew tablet Chew 1 tablet by mouth 2 (two) times daily.      . Coenzyme Q10 (COQ10 PO) Take by mouth daily.      . Echinacea-Vitamin C 250-250 MG CAPS Take by mouth daily.      Marland Kitchen escitalopram (LEXAPRO) 10 MG tablet Take 1 tablet (10 mg total) by mouth daily. 90 tablet 1  . fluticasone (FLOVENT HFA) 110 MCG/ACT inhaler Inhale 2 puffs into the lungs 2 (two) times daily. 1 Inhaler 11  . Magnesium 250 MG TABS Take 1 mg by mouth every morning.      . meloxicam (MOBIC) 7.5 MG tablet Take 1 tablet (7.5 mg total) by mouth daily. 90 tablet 0  . metoprolol (LOPRESSOR) 50 MG tablet TAKE ONE TABLET BY MOUTH IN THE MORNING AND TAKE ONE-HALF TABLET BY MOUTH  IN THE EVENING - INCREASE IN DOSE 135 tablet 1  . Multiple Vitamin (MULTIVITAMIN) tablet Take 1 tablet by mouth daily.      . pantoprazole (PROTONIX) 40 MG tablet Take 40 mg by mouth daily.      Marland Kitchen ALPRAZolam (XANAX) 1 MG tablet Take 1 tablet (1 mg total) by mouth 2 (two) times daily. (Patient not taking: Reported on 01/29/2015) 60 tablet 2   Objective: BP 130/70 mmHg   Pulse 73  Temp(Src) 97.5 F (36.4 C)  Wt 150 lb (68.04 kg)  SpO2 99% Gen: NAD, resting comfortably HEENT: L frontal sinus tenderness noted, turbinates erythematous with some yellow drainage, TM normal No lymphadenopathy, oropharynx normal CV: RRR no rubs or gallops Lungs: CTAB no crackles, wheeze, rhonchi Ext: no edema Skin: warm, dry, no rash   Assessment/Plan:  Bacterial Sinusitis Some improvement then recurrence despite 2 courses of prednisone. Symptoms of sinus pressure and drainage >1 month on good allergic rhinitis treatment. We will use a 10 day course of augmentin and hope for resolution of symptoms. Return precautions advised.   Meds ordered this encounter  Medications  . amoxicillin-clavulanate (AUGMENTIN) 875-125 MG per tablet    Sig: Take 1 tablet by mouth 2 (two) times daily.    Dispense:  20 tablet    Refill:  0

## 2015-03-12 ENCOUNTER — Other Ambulatory Visit: Payer: Self-pay

## 2015-03-12 DIAGNOSIS — Z1231 Encounter for screening mammogram for malignant neoplasm of breast: Secondary | ICD-10-CM

## 2015-03-21 ENCOUNTER — Ambulatory Visit (INDEPENDENT_AMBULATORY_CARE_PROVIDER_SITE_OTHER): Payer: Medicare Other | Admitting: Internal Medicine

## 2015-03-21 ENCOUNTER — Encounter: Payer: Self-pay | Admitting: Internal Medicine

## 2015-03-21 VITALS — BP 100/60 | HR 77 | Temp 98.1°F | Ht 66.0 in | Wt 149.7 lb

## 2015-03-21 DIAGNOSIS — E041 Nontoxic single thyroid nodule: Secondary | ICD-10-CM

## 2015-03-21 DIAGNOSIS — R748 Abnormal levels of other serum enzymes: Secondary | ICD-10-CM | POA: Diagnosis not present

## 2015-03-21 DIAGNOSIS — E785 Hyperlipidemia, unspecified: Secondary | ICD-10-CM | POA: Diagnosis not present

## 2015-03-21 DIAGNOSIS — R7989 Other specified abnormal findings of blood chemistry: Secondary | ICD-10-CM

## 2015-03-21 DIAGNOSIS — Z23 Encounter for immunization: Secondary | ICD-10-CM | POA: Diagnosis not present

## 2015-03-21 DIAGNOSIS — I10 Essential (primary) hypertension: Secondary | ICD-10-CM

## 2015-03-21 DIAGNOSIS — F411 Generalized anxiety disorder: Secondary | ICD-10-CM

## 2015-03-21 LAB — T4, FREE: Free T4: 0.88 ng/dL (ref 0.60–1.60)

## 2015-03-21 LAB — BASIC METABOLIC PANEL
BUN: 16 mg/dL (ref 6–23)
CHLORIDE: 104 meq/L (ref 96–112)
CO2: 28 meq/L (ref 19–32)
CREATININE: 0.63 mg/dL (ref 0.40–1.20)
Calcium: 9.4 mg/dL (ref 8.4–10.5)
GFR: 117.79 mL/min (ref >60.00)
Glucose, Bld: 150 mg/dL — ABNORMAL HIGH (ref 70–99)
Potassium: 3.4 mEq/L — ABNORMAL LOW (ref 3.5–5.1)
Sodium: 136 mEq/L (ref 135–145)

## 2015-03-21 LAB — HEPATIC FUNCTION PANEL
ALK PHOS: 134 U/L — AB (ref 39–117)
ALT: 28 U/L (ref 0–35)
AST: 23 U/L (ref 0–37)
Albumin: 3.8 g/dL (ref 3.5–5.2)
BILIRUBIN DIRECT: 0.1 mg/dL (ref 0.0–0.3)
BILIRUBIN TOTAL: 0.5 mg/dL (ref 0.2–1.2)
TOTAL PROTEIN: 6.8 g/dL (ref 6.0–8.3)

## 2015-03-21 LAB — TSH: TSH: 0.7 u[IU]/mL (ref 0.35–4.50)

## 2015-03-21 NOTE — Assessment & Plan Note (Signed)
BP well controlled.  No change in medication. BP: 100/60 mmHg

## 2015-03-21 NOTE — Assessment & Plan Note (Addendum)
Follow-up thyroid ultrasound in February 2016 was unchanged. She has my suppressed TSH. Repeat thyroid function test today. If it shows hyperthyroidism, we discussed performing thyroid scan and referral to endocrinology.

## 2015-03-21 NOTE — Progress Notes (Signed)
Subjective:    Patient ID: [name], female    DOB: November 24, 1936, 79 y.o.   MRN: [id number]  HPI  79 year old African American female with history of hypertension, multinodular goiter and chronic anxiety for follow-up. Patient reports follow-up with Dr. Harlow Asa for thyroid nodule. He repeated thyroid ultrasound in Feb, 2016. There was no significant change. Her blood work in October 2015 showed mildly suppressed TSH. She has had 4-5 pound weight loss over the last 5-6 months.  She discontinued Lexapro because she thought it may be contributory to weight loss. She has history of chronic anxiety. 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 She also uses alprazolam 1 mg 1/2-1 tablet twice daily as needed.  Hypertension-stable  Review of Systems Weight loss, chronic anxiety (worrying)    Past Medical History  Diagnosis Date  . VITAMIN B12 DEFICIENCY 09/02/2007  . HYPERLIPIDEMIA 12/09/2007  . ESSENTIAL HYPERTENSION 10/29/2010  . RBBB 09/05/2010  . VENTRICULAR HYPERTROPHY, LEFT 08/12/2010  . GERD 09/02/2007  . DIVERTICULAR DISEASE 09/03/2010  . CHOLELITHIASIS 09/03/2010  . GALLSTONE PANCREATITIS 09/03/2010  . ARTHRITIS 09/03/2010  . OSTEOPOROSIS 05/17/2009  . COLONIC POLYPS, HX OF 06/02/2008  . HEART MURMUR, HX OF 08/12/2010  . DYSPHAGIA UNSPECIFIED 09/03/2010  . Palpitations 09/03/2010    event monitor reveals long RP tachycardia, likely sinus tachycardia  . WEIGHT LOSS, ABNORMAL 06/02/2008  . Hiatal hernia   . Hematuria   . Polyuria   . Insomnia   . Paresthesia   . Lichen simplex chronicus   . Anxiety state     unspecified  . Low back pain     chronic  . Fibromyalgia   . Osteopenia   . Asthma   . Hx of colonoscopy     History   Social History  . Marital Status: Widowed    Spouse Name: N/A  . Number of Children: 6  . Years of Education: N/A   Occupational History  . retired    Social History Main Topics  . Smoking status: Never Smoker   . Smokeless tobacco: Never Used  . Alcohol Use: No  . Drug  Use: No  . Sexual Activity: No   Other Topics Concern  . Not on file   Social History Narrative   Widowed   2 children   Lives in [place]   Retired from [employer]    Past Surgical History  Procedure Laterality Date  . Cholecystectomy    . Appendectomy    . Spine surgery      cervical fusion  . Abdominal hysterectomy      THA and O  . Esophagogastroduodenoscopy      with HH and esophagitis  . Oophorectomy    . Neck surgery      Family History  Problem Relation Age of Onset  . Heart disease Mother   . Stroke Mother   . Prostate cancer Father   . Colon cancer Father 67    Allergies  Allergen Reactions  . Celebrex [Celecoxib] Rash    REACTION: rash    Current Outpatient Prescriptions on File Prior to Visit  Medication Sig Dispense Refill  . ALPRAZolam (XANAX) 1 MG tablet Take 1 tablet (1 mg total) by mouth 2 (two) times daily. 60 tablet 2  . budesonide (RHINOCORT AQUA) 32 MCG/ACT nasal spray 1 spray by Nasal route daily as needed.      . Calcium Carbonate-Vitamin D (CALTRATE 600+D) 600-400 MG-UNIT per chew tablet Chew 1 tablet by mouth 2 (two) times  daily.      . Coenzyme Q10 (COQ10 PO) Take by mouth daily.      . Echinacea-Vitamin C 250-250 MG CAPS Take by mouth daily.      . fluticasone (FLOVENT HFA) 110 MCG/ACT inhaler Inhale 2 puffs into the lungs 2 (two) times daily. 1 Inhaler 11  . Magnesium 250 MG TABS Take 1 mg by mouth every morning.      . meloxicam (MOBIC) 7.5 MG tablet Take 1 tablet (7.5 mg total) by mouth daily. 90 tablet 0  . metoprolol (LOPRESSOR) 50 MG tablet TAKE ONE TABLET BY MOUTH IN THE MORNING AND TAKE ONE-HALF TABLET BY MOUTH  IN THE EVENING - INCREASE IN DOSE 135 tablet 1  . Multiple Vitamin (MULTIVITAMIN) tablet Take 1 tablet by mouth daily.      . pantoprazole (PROTONIX) 40 MG tablet Take 40 mg by mouth daily.       No current facility-administered medications on file prior to visit.    BP 100/60 mmHg  Pulse 77  Temp(Src) 98.1 F  (36.7 C) (Oral)  Ht 5\' 6"  (1.676 m)  Wt 149 lb 11.2 oz (67.903 kg)  BMI 24.17 kg/m2    Objective:   Physical Exam  Constitutional: She is oriented to person, place, and time. She appears well-developed and well-nourished. No distress.  HENT:  Head: Normocephalic and atraumatic.  Right Ear: External ear normal.  Left Ear: External ear normal.  Neck:  Palpable right thyroid nodule  Cardiovascular: Normal rate, regular rhythm and normal heart sounds.   No murmur heard. Pulmonary/Chest: Effort normal and breath sounds normal. She has no wheezes.  Musculoskeletal: She exhibits no edema.  Neurological: She is alert and oriented to person, place, and time.  Skin: Skin is warm and dry.  Psychiatric: She has a normal mood and affect. Her behavior is normal.          Assessment & Plan:

## 2015-03-21 NOTE — Progress Notes (Signed)
Pre visit review using our clinic review tool, if applicable. No additional management support is needed unless otherwise documented below in the visit note. 

## 2015-03-21 NOTE — Assessment & Plan Note (Signed)
Patient encouraged to take Lexapro on a regular basis.  Patient advised to use alprazolam sparingly.

## 2015-03-21 NOTE — Addendum Note (Signed)
Addended by: Townsend Roger D on: 03/21/2015 04:29 PM   Modules accepted: Orders

## 2015-03-27 DIAGNOSIS — E042 Nontoxic multinodular goiter: Secondary | ICD-10-CM | POA: Diagnosis not present

## 2015-04-03 DIAGNOSIS — L723 Sebaceous cyst: Secondary | ICD-10-CM | POA: Diagnosis not present

## 2015-04-09 ENCOUNTER — Other Ambulatory Visit: Payer: Self-pay | Admitting: Internal Medicine

## 2015-04-09 DIAGNOSIS — R748 Abnormal levels of other serum enzymes: Secondary | ICD-10-CM

## 2015-04-09 NOTE — Addendum Note (Signed)
Addended by: Townsend Roger D on: 04/09/2015 09:47 AM   Modules accepted: Orders

## 2015-04-11 ENCOUNTER — Other Ambulatory Visit (INDEPENDENT_AMBULATORY_CARE_PROVIDER_SITE_OTHER): Payer: Medicare Other

## 2015-04-11 DIAGNOSIS — R748 Abnormal levels of other serum enzymes: Secondary | ICD-10-CM

## 2015-04-11 LAB — GAMMA GT: GGT: 32 U/L (ref 7–51)

## 2015-04-18 ENCOUNTER — Other Ambulatory Visit: Payer: Medicare Other

## 2015-04-18 ENCOUNTER — Ambulatory Visit
Admission: RE | Admit: 2015-04-18 | Discharge: 2015-04-18 | Disposition: A | Discharge status: Home / Self Care | Payer: Medicare Other | Source: Ambulatory Visit | Attending: Internal Medicine | Admitting: Internal Medicine

## 2015-04-18 DIAGNOSIS — Z9049 Acquired absence of other specified parts of digestive tract: Secondary | ICD-10-CM | POA: Diagnosis not present

## 2015-04-18 DIAGNOSIS — R748 Abnormal levels of other serum enzymes: Secondary | ICD-10-CM | POA: Diagnosis not present

## 2015-04-26 ENCOUNTER — Telehealth: Payer: Self-pay | Admitting: Internal Medicine

## 2015-04-26 DIAGNOSIS — R748 Abnormal levels of other serum enzymes: Secondary | ICD-10-CM

## 2015-04-26 NOTE — Telephone Encounter (Signed)
Pt returned you call and will  be home Monday am. (not tormorrow, friday)

## 2015-04-27 NOTE — Telephone Encounter (Signed)
See result note.  

## 2015-04-30 ENCOUNTER — Other Ambulatory Visit: Payer: Self-pay | Admitting: Internal Medicine

## 2015-04-30 DIAGNOSIS — R748 Abnormal levels of other serum enzymes: Secondary | ICD-10-CM

## 2015-04-30 DIAGNOSIS — M810 Age-related osteoporosis without current pathological fracture: Secondary | ICD-10-CM

## 2015-04-30 NOTE — Telephone Encounter (Signed)
Patient is scheduled for Bone Density and she will have her labs done at [facility] on the same day.  Patient is also aware about the referral to Endo.

## 2015-04-30 NOTE — Telephone Encounter (Signed)
Lab ordered entered for [facility]

## 2015-04-30 NOTE — Addendum Note (Signed)
Addended by: Townsend Roger D on: 04/30/2015 04:09 PM   Modules accepted: Orders

## 2015-05-04 ENCOUNTER — Encounter: Payer: Self-pay | Admitting: Internal Medicine

## 2015-05-08 ENCOUNTER — Other Ambulatory Visit: Payer: Self-pay | Admitting: Internal Medicine

## 2015-05-14 ENCOUNTER — Telehealth: Payer: Self-pay | Admitting: Internal Medicine

## 2015-05-14 NOTE — Telephone Encounter (Signed)
Pt needs refill on alprazolam call into [facility] [place]

## 2015-05-15 NOTE — Telephone Encounter (Signed)
Ok to RF x 2

## 2015-05-16 ENCOUNTER — Ambulatory Visit (INDEPENDENT_AMBULATORY_CARE_PROVIDER_SITE_OTHER)
Admission: RE | Admit: 2015-05-16 | Discharge: 2015-05-16 | Disposition: A | Discharge status: Home / Self Care | Payer: Medicare Other | Source: Ambulatory Visit | Attending: Internal Medicine | Admitting: Internal Medicine

## 2015-05-16 ENCOUNTER — Other Ambulatory Visit: Payer: Medicare Other

## 2015-05-16 DIAGNOSIS — R748 Abnormal levels of other serum enzymes: Secondary | ICD-10-CM | POA: Diagnosis not present

## 2015-05-16 DIAGNOSIS — M810 Age-related osteoporosis without current pathological fracture: Secondary | ICD-10-CM | POA: Diagnosis not present

## 2015-05-16 MED ORDER — ALPRAZOLAM 1 MG PO TABS: 1.0000 mg | ORAL_TABLET | Freq: Two times a day (BID) | ORAL | Status: DC

## 2015-05-16 NOTE — Telephone Encounter (Signed)
rx sent in electronically 

## 2015-05-17 LAB — PTH, INTACT AND CALCIUM
Calcium: 9.5 mg/dL (ref 8.4–10.5)
PTH: 29 pg/mL (ref 14–64)

## 2015-05-21 ENCOUNTER — Telehealth: Payer: Self-pay | Admitting: Cardiovascular Disease

## 2015-05-21 ENCOUNTER — Telehealth: Payer: Self-pay | Admitting: Internal Medicine

## 2015-05-21 ENCOUNTER — Other Ambulatory Visit: Payer: Self-pay | Admitting: *Deleted

## 2015-05-21 MED ORDER — METOPROLOL TARTRATE 50 MG PO TABS: ORAL_TABLET | ORAL | Status: DC

## 2015-05-21 NOTE — Telephone Encounter (Signed)
Patient states she is returning your call.  She would like a callback before 12 on 05/22/15 since she will be unavailable after that time.

## 2015-05-21 NOTE — Telephone Encounter (Signed)
Walk in pt form-pt needs refill on meds-gave to [facility]

## 2015-05-23 ENCOUNTER — Ambulatory Visit
Admission: RE | Admit: 2015-05-23 | Discharge: 2015-05-23 | Disposition: A | Discharge status: Home / Self Care | Payer: Medicare Other | Source: Ambulatory Visit

## 2015-05-23 DIAGNOSIS — Z6823 Body mass index (BMI) 23.0-23.9, adult: Secondary | ICD-10-CM | POA: Diagnosis not present

## 2015-05-23 DIAGNOSIS — Z1231 Encounter for screening mammogram for malignant neoplasm of breast: Secondary | ICD-10-CM | POA: Diagnosis not present

## 2015-05-23 DIAGNOSIS — Z01419 Encounter for gynecological examination (general) (routine) without abnormal findings: Secondary | ICD-10-CM | POA: Diagnosis not present

## 2015-05-23 NOTE — Telephone Encounter (Signed)
See result note.  

## 2015-05-26 ENCOUNTER — Emergency Department (HOSPITAL_COMMUNITY)
Admission: EM | Admit: 2015-05-26 | Discharge: 2015-05-27 | Disposition: A | Discharge status: Home / Self Care | Payer: Medicare Other | Attending: Emergency Medicine | Admitting: Emergency Medicine

## 2015-05-26 ENCOUNTER — Encounter (HOSPITAL_COMMUNITY): Payer: Self-pay | Admitting: Emergency Medicine

## 2015-05-26 DIAGNOSIS — Z7951 Long term (current) use of inhaled steroids: Secondary | ICD-10-CM | POA: Diagnosis not present

## 2015-05-26 DIAGNOSIS — R109 Unspecified abdominal pain: Secondary | ICD-10-CM | POA: Diagnosis present

## 2015-05-26 DIAGNOSIS — G8929 Other chronic pain: Secondary | ICD-10-CM | POA: Insufficient documentation

## 2015-05-26 DIAGNOSIS — F411 Generalized anxiety disorder: Secondary | ICD-10-CM | POA: Insufficient documentation

## 2015-05-26 DIAGNOSIS — Z8639 Personal history of other endocrine, nutritional and metabolic disease: Secondary | ICD-10-CM | POA: Diagnosis not present

## 2015-05-26 DIAGNOSIS — I10 Essential (primary) hypertension: Secondary | ICD-10-CM | POA: Diagnosis not present

## 2015-05-26 DIAGNOSIS — Z872 Personal history of diseases of the skin and subcutaneous tissue: Secondary | ICD-10-CM | POA: Insufficient documentation

## 2015-05-26 DIAGNOSIS — J45909 Unspecified asthma, uncomplicated: Secondary | ICD-10-CM | POA: Insufficient documentation

## 2015-05-26 DIAGNOSIS — R011 Cardiac murmur, unspecified: Secondary | ICD-10-CM | POA: Diagnosis not present

## 2015-05-26 DIAGNOSIS — K219 Gastro-esophageal reflux disease without esophagitis: Secondary | ICD-10-CM | POA: Insufficient documentation

## 2015-05-26 DIAGNOSIS — Z87442 Personal history of urinary calculi: Secondary | ICD-10-CM | POA: Diagnosis not present

## 2015-05-26 DIAGNOSIS — Z79899 Other long term (current) drug therapy: Secondary | ICD-10-CM | POA: Insufficient documentation

## 2015-05-26 DIAGNOSIS — Z9889 Other specified postprocedural states: Secondary | ICD-10-CM | POA: Insufficient documentation

## 2015-05-26 DIAGNOSIS — M1990 Unspecified osteoarthritis, unspecified site: Secondary | ICD-10-CM | POA: Insufficient documentation

## 2015-05-26 DIAGNOSIS — Z8669 Personal history of other diseases of the nervous system and sense organs: Secondary | ICD-10-CM | POA: Diagnosis not present

## 2015-05-26 DIAGNOSIS — Z86010 Personal history of colonic polyps: Secondary | ICD-10-CM | POA: Insufficient documentation

## 2015-05-26 DIAGNOSIS — M810 Age-related osteoporosis without current pathological fracture: Secondary | ICD-10-CM | POA: Diagnosis not present

## 2015-05-26 DIAGNOSIS — Z791 Long term (current) use of non-steroidal anti-inflammatories (NSAID): Secondary | ICD-10-CM | POA: Diagnosis not present

## 2015-05-26 DIAGNOSIS — N390 Urinary tract infection, site not specified: Secondary | ICD-10-CM | POA: Diagnosis not present

## 2015-05-26 LAB — COMPREHENSIVE METABOLIC PANEL
ALT: 45 U/L (ref 14–54)
AST: 32 U/L (ref 15–41)
Albumin: 3.5 g/dL (ref 3.5–5.0)
Alkaline Phosphatase: 109 U/L (ref 38–126)
Anion gap: 10 (ref 5–15)
BILIRUBIN TOTAL: 0.9 mg/dL (ref 0.3–1.2)
BUN: 13 mg/dL (ref 6–20)
CALCIUM: 9.1 mg/dL (ref 8.9–10.3)
CO2: 23 mmol/L (ref 22–32)
Chloride: 98 mmol/L — ABNORMAL LOW (ref 101–111)
Creatinine, Ser: 0.6 mg/dL (ref 0.44–1.00)
GFR calc Af Amer: >60 mL/min (ref >60)
GFR calc non Af Amer: >60 mL/min (ref >60)
GLUCOSE: 282 mg/dL — AB (ref 65–99)
POTASSIUM: 3.7 mmol/L (ref 3.5–5.1)
Sodium: 131 mmol/L — ABNORMAL LOW (ref 135–145)
Total Protein: 7.2 g/dL (ref 6.5–8.1)

## 2015-05-26 LAB — CBC
HCT: 36.8 % (ref 36.0–46.0)
Hemoglobin: 12.3 g/dL (ref 12.0–15.0)
MCH: 29.4 pg (ref 26.0–34.0)
MCHC: 33.4 g/dL (ref 30.0–36.0)
MCV: 87.8 fL (ref 78.0–100.0)
Platelets: 242 10*3/uL (ref 150–400)
RBC: 4.19 MIL/uL (ref 3.87–5.11)
RDW: 13.8 % (ref 11.5–15.5)
WBC: 19 10*3/uL — AB (ref 4.0–10.5)

## 2015-05-26 LAB — URINALYSIS, ROUTINE W REFLEX MICROSCOPIC
Bilirubin Urine: NEGATIVE
Glucose, UA: >1000 mg/dL — AB
KETONES UR: NEGATIVE mg/dL
NITRITE: POSITIVE — AB
Protein, ur: 100 mg/dL — AB
Specific Gravity, Urine: 1.036 — ABNORMAL HIGH (ref 1.005–1.030)
Urobilinogen, UA: 0.2 mg/dL (ref 0.0–1.0)
pH: 5.5 (ref 5.0–8.0)

## 2015-05-26 LAB — URINE MICROSCOPIC-ADD ON

## 2015-05-26 LAB — LIPASE, BLOOD: LIPASE: 16 U/L — AB (ref 22–51)

## 2015-05-26 MED ORDER — SODIUM CHLORIDE 0.9 % IV SOLN
1000.0000 mL | INTRAVENOUS | Status: DC
  Administered 2015-05-26: 500 mL via INTRAVENOUS

## 2015-05-26 MED ORDER — HYDROMORPHONE HCL 1 MG/ML IJ SOLN
0.5000 mg | INTRAMUSCULAR | Status: DC | PRN
  Administered 2015-05-26: 0.5 mg via INTRAVENOUS
  Filled 2015-05-26: qty 1

## 2015-05-26 MED ORDER — SODIUM CHLORIDE 0.9 % IV SOLN
1000.0000 mL | Freq: Once | INTRAVENOUS | Status: AC
  Administered 2015-05-26: 1000 mL via INTRAVENOUS

## 2015-05-26 MED ORDER — DEXTROSE 5 % IV SOLN
1.0000 g | Freq: Once | INTRAVENOUS | Status: AC
  Administered 2015-05-26: 1 g via INTRAVENOUS
  Filled 2015-05-26: qty 10

## 2015-05-26 MED ORDER — ONDANSETRON HCL 4 MG/2ML IJ SOLN
4.0000 mg | Freq: Once | INTRAMUSCULAR | Status: AC
Start: 2015-05-26 — End: 2015-05-26
  Administered 2015-05-26: 4 mg via INTRAVENOUS
  Filled 2015-05-26: qty 2

## 2015-05-26 MED ORDER — TRAMADOL HCL 50 MG PO TABS: 50.0000 mg | ORAL_TABLET | Freq: Four times a day (QID) | ORAL | Status: DC | PRN

## 2015-05-26 MED ORDER — LEVOFLOXACIN 250 MG PO TABS: 250.0000 mg | ORAL_TABLET | Freq: Every day | ORAL | Status: DC

## 2015-05-26 NOTE — ED Provider Notes (Signed)
CSN: [id number]     Arrival date & time 05/26/15  2004 History   First MD Initiated Contact with Patient 05/26/15 2021     Chief Complaint  Patient presents with  . Hematuria  . Flank Pain   HPI Patient presents to the emergency room with complaints of left flank pain and hematuria. The patient noted some blood tinged discoloration of her urine. No gross hematuria. She states the symptoms started on Thursday. She has had some intermittent discomfort in her left flank area moving towards the front. She has also noticed some burning with urination. He denies any trouble with vomiting or diarrhea. She denies any trouble chest pain or shortness of breath currently. Patient has a history of kidney stones in the past but was many years ago and she does not recall if this feels similar to all to that. Past Medical History  Diagnosis Date  . VITAMIN B12 DEFICIENCY 09/02/2007  . HYPERLIPIDEMIA 12/09/2007  . ESSENTIAL HYPERTENSION 10/29/2010  . RBBB 09/05/2010  . VENTRICULAR HYPERTROPHY, LEFT 08/12/2010  . GERD 09/02/2007  . DIVERTICULAR DISEASE 09/03/2010  . CHOLELITHIASIS 09/03/2010  . GALLSTONE PANCREATITIS 09/03/2010  . ARTHRITIS 09/03/2010  . OSTEOPOROSIS 05/17/2009  . COLONIC POLYPS, HX OF 06/02/2008  . HEART MURMUR, HX OF 08/12/2010  . DYSPHAGIA UNSPECIFIED 09/03/2010  . Palpitations 09/03/2010    event monitor reveals long RP tachycardia, likely sinus tachycardia  . WEIGHT LOSS, ABNORMAL 06/02/2008  . Hiatal hernia   . Hematuria   . Polyuria   . Insomnia   . Paresthesia   . Lichen simplex chronicus   . Anxiety state     unspecified  . Low back pain     chronic  . Fibromyalgia   . Osteopenia   . Asthma   . Hx of colonoscopy    Past Surgical History  Procedure Laterality Date  . Cholecystectomy    . Appendectomy    . Spine surgery      cervical fusion  . Abdominal hysterectomy      THA and O  . Esophagogastroduodenoscopy      with HH and esophagitis  . Oophorectomy    . Neck surgery      Family History  Problem Relation Age of Onset  . Heart disease Mother   . Stroke Mother   . Prostate cancer Father   . Colon cancer Father 34   History  Substance Use Topics  . Smoking status: Never Smoker   . Smokeless tobacco: Never Used  . Alcohol Use: No   OB History    No data available     Review of Systems  HENT: Positive for congestion.   Respiratory: Positive for cough.   All other systems reviewed and are negative.     Allergies  Celebrex  Home Medications   Prior to Admission medications   Medication Sig Start Date End Date Taking? Authorizing Provider  ALPRAZolam Duanne Moron) 1 MG tablet Take 1 tablet (1 mg total) by mouth 2 (two) times daily. 05/16/15   Doe-Hyun R Shawna Orleans, DO  budesonide (RHINOCORT AQUA) 32 MCG/ACT nasal spray 1 spray by Nasal route daily as needed.      Historical Provider, MD  Calcium Carbonate-Vitamin D (CALTRATE 600+D) 600-400 MG-UNIT per chew tablet Chew 1 tablet by mouth 2 (two) times daily.      Historical Provider, MD  Coenzyme Q10 (COQ10 PO) Take by mouth daily.      Historical Provider, MD  Echinacea-Vitamin C 250-250 MG CAPS Take by  mouth daily.      Historical Provider, MD  fluticasone (FLOVENT HFA) 110 MCG/ACT inhaler Inhale 2 puffs into the lungs 2 (two) times daily. 10/16/14   Doe-Hyun R Shawna Orleans, DO  Magnesium 250 MG TABS Take 1 mg by mouth every morning.      Historical Provider, MD  meloxicam (MOBIC) 7.5 MG tablet Take 1 tablet (7.5 mg total) by mouth daily. 10/16/14   Doe-Hyun R Shawna Orleans, DO  metoprolol (LOPRESSOR) 50 MG tablet TAKE ONE TABLET BY MOUTH IN THE MORNING AND TAKE ONE-HALF TABLET BY MOUTH  IN THE EVENING - INCREASE IN DOSE 05/21/15   Josue Hector, MD  Multiple Vitamin (MULTIVITAMIN) tablet Take 1 tablet by mouth daily.      Historical Provider, MD  pantoprazole (PROTONIX) 40 MG tablet Take 40 mg by mouth daily.      Historical Provider, MD   BP 163/61 mmHg  Pulse 98  Temp(Src) 98 F (36.7 C) (Oral)  Resp 20  SpO2  95% Physical Exam  Constitutional: She appears well-developed and well-nourished. No distress.  HENT:  Head: Normocephalic and atraumatic.  Right Ear: External ear normal.  Left Ear: External ear normal.  Eyes: Conjunctivae are normal. Right eye exhibits no discharge. Left eye exhibits no discharge. No scleral icterus.  Neck: Neck supple. No tracheal deviation present.  Cardiovascular: Normal rate, regular rhythm and intact distal pulses.   Pulmonary/Chest: Effort normal and breath sounds normal. No stridor. No respiratory distress. She has no wheezes. She has no rales.  Abdominal: Soft. Bowel sounds are normal. She exhibits no distension. There is no tenderness. There is no rebound and no guarding.  Mild tenderness to palpation left flank  Musculoskeletal: She exhibits no edema or tenderness.  Neurological: She is alert. She has normal strength. No cranial nerve deficit (no facial droop, extraocular movements intact, no slurred speech) or sensory deficit. She exhibits normal muscle tone. She displays no seizure activity. Coordination normal.  Skin: Skin is warm and dry. No rash noted.  Psychiatric: She has a normal mood and affect.  Nursing note and vitals reviewed.   ED Course  Procedures (including critical care time) Labs Review Labs Reviewed  URINALYSIS, ROUTINE W REFLEX MICROSCOPIC (NOT AT [facility]) - Abnormal; Notable for the following:    Color, Urine AMBER (*)    APPearance CLOUDY (*)    Specific Gravity, Urine 1.036 (*)    Glucose, UA >1000 (*)    Hgb urine dipstick MODERATE (*)    Protein, ur 100 (*)    Nitrite POSITIVE (*)    Leukocytes, UA SMALL (*)    All other components within normal limits  COMPREHENSIVE METABOLIC PANEL - Abnormal; Notable for the following:    Sodium 131 (*)    Chloride 98 (*)    Glucose, Bld 282 (*)    All other components within normal limits  CBC - Abnormal; Notable for the following:    WBC 19.0 (*)    All other components within normal  limits  LIPASE, BLOOD - Abnormal; Notable for the following:    Lipase 16 (*)    All other components within normal limits  URINE MICROSCOPIC-ADD ON - Abnormal; Notable for the following:    Bacteria, UA MANY (*)    All other components within normal limits  URINE CULTURE    Medications  0.9 %  sodium chloride infusion (1,000 mLs Intravenous New Bag/Given 05/26/15 2046)    Followed by  0.9 %  sodium chloride infusion (500 mLs  Intravenous New Bag/Given 05/26/15 2320)  HYDROmorphone (DILAUDID) injection 0.5 mg (0.5 mg Intravenous Given 05/26/15 2048)  cefTRIAXone (ROCEPHIN) 1 g in dextrose 5 % 50 mL IVPB (not administered)  ondansetron (ZOFRAN) injection 4 mg (4 mg Intravenous Given 05/26/15 2047)     MDM   Final diagnoses:  UTI (lower urinary tract infection)    Patient's urinalysis is consistent with urinary tract infection. I doubt ureterolithiasis.Patient is not tachycardic or febrile. Doubt sepsis.   Plan on discharge home with oral anti-biotics and pain medications.  Extend course for possible early pyelo coverage with her leukocytosis.  Rx levaquin to cover for pulm source because of her cough symptoms.     Dorie Rank, MD 05/26/15 [phone]

## 2015-05-26 NOTE — ED Notes (Signed)
Pt c/o L sided flank pain and hematuria since Thursday. Pt has hx of kidney stones. Denies N/V/D, chest pain, SOB. Pt A&Ox4 and ambulatory. Pt also c/o burning upon urination.

## 2015-05-26 NOTE — Discharge Instructions (Signed)

## 2015-05-28 LAB — URINE CULTURE: Colony Count: >=100000

## 2015-05-29 ENCOUNTER — Telehealth (HOSPITAL_BASED_OUTPATIENT_CLINIC_OR_DEPARTMENT_OTHER): Payer: Self-pay | Admitting: Emergency Medicine

## 2015-05-29 NOTE — Telephone Encounter (Signed)
Post ED Visit - Positive Culture Follow-up  Culture report reviewed by antimicrobial stewardship pharmacist: []  Wes Dulaney, Pharm.D., BCPS []  Heide Guile, Pharm.D., BCPS []  Alycia Rossetti, Pharm.D., BCPS []  [place], [place].D., BCPS, AAHIVP []  Legrand Como, Pharm.D., BCPS, AAHIVP []  Isac Sarna, Pharm.D., BCPS X Tegan Magsam Pharm D  Positive urine  Culture Klebsiella Treated with levofloxacin, organism sensitive to the same and no further patient follow-up is required at this time.  Hazle Nordmann 05/29/2015, 2:26 PM

## 2015-06-01 DIAGNOSIS — N3000 Acute cystitis without hematuria: Secondary | ICD-10-CM | POA: Diagnosis not present

## 2015-06-01 DIAGNOSIS — R739 Hyperglycemia, unspecified: Secondary | ICD-10-CM | POA: Diagnosis not present

## 2015-06-01 DIAGNOSIS — M545 Low back pain: Secondary | ICD-10-CM | POA: Diagnosis not present

## 2015-06-11 ENCOUNTER — Encounter: Payer: Self-pay | Admitting: Internal Medicine

## 2015-06-11 ENCOUNTER — Ambulatory Visit (INDEPENDENT_AMBULATORY_CARE_PROVIDER_SITE_OTHER): Payer: Medicare Other | Admitting: Internal Medicine

## 2015-06-11 VITALS — BP 120/62 | HR 92 | Temp 98.5°F | Resp 18 | Ht 66.0 in | Wt 136.0 lb

## 2015-06-11 DIAGNOSIS — R002 Palpitations: Secondary | ICD-10-CM

## 2015-06-11 DIAGNOSIS — F411 Generalized anxiety disorder: Secondary | ICD-10-CM | POA: Diagnosis not present

## 2015-06-11 DIAGNOSIS — I452 Bifascicular block: Secondary | ICD-10-CM

## 2015-06-11 DIAGNOSIS — I10 Essential (primary) hypertension: Secondary | ICD-10-CM | POA: Diagnosis not present

## 2015-06-11 DIAGNOSIS — M545 Low back pain: Secondary | ICD-10-CM | POA: Diagnosis not present

## 2015-06-11 DIAGNOSIS — J90 Pleural effusion, not elsewhere classified: Secondary | ICD-10-CM

## 2015-06-11 DIAGNOSIS — E119 Type 2 diabetes mellitus without complications: Secondary | ICD-10-CM

## 2015-06-11 NOTE — Progress Notes (Signed)
Subjective:    Patient ID: [name], female    DOB: 19-Feb-1936, 79 y.o.   MRN: [id number]  HPI   79 year old patient who presents the chief complaint of left flank discomfort. She was seen in the ED on May 28 with the same complaint and was treated for a UTI. She completed Levaquin. The discomfort as mild but persistent.  Denies any chest pain, shortness of breath or cough. She does state that she had a very mild cough when she went to the ED 2 weeks ago, but this has not persisted.  Denies any hemoptysis.   A random blood sugar was elevated at 282.  No prior history of diabetes   She does have a history of palpitations and bifascicular block with a left anterior hemiblock and a right bundle branch block.   She has essential hypertension history of anxiety and is followed for a thyroid nodule.  Wt Readings from Last 3 Encounters:  06/11/15 136 lb (61.689 kg)  03/21/15 149 lb 11.2 oz (67.903 kg)  03/05/15 150 lb (68.04 kg)    Past Medical History  Diagnosis Date  . VITAMIN B12 DEFICIENCY 09/02/2007  . HYPERLIPIDEMIA 12/09/2007  . ESSENTIAL HYPERTENSION 10/29/2010  . RBBB 09/05/2010  . VENTRICULAR HYPERTROPHY, LEFT 08/12/2010  . GERD 09/02/2007  . DIVERTICULAR DISEASE 09/03/2010  . CHOLELITHIASIS 09/03/2010  . GALLSTONE PANCREATITIS 09/03/2010  . ARTHRITIS 09/03/2010  . OSTEOPOROSIS 05/17/2009  . COLONIC POLYPS, HX OF 06/02/2008  . HEART MURMUR, HX OF 08/12/2010  . DYSPHAGIA UNSPECIFIED 09/03/2010  . Palpitations 09/03/2010    event monitor reveals long RP tachycardia, likely sinus tachycardia  . WEIGHT LOSS, ABNORMAL 06/02/2008  . Hiatal hernia   . Hematuria   . Polyuria   . Insomnia   . Paresthesia   . Lichen simplex chronicus   . Anxiety state     unspecified  . Low back pain     chronic  . Fibromyalgia   . Osteopenia   . Asthma   . Hx of colonoscopy     History   Social History  . Marital Status: Widowed    Spouse Name: N/A  . Number of Children: 6  . Years of Education:  N/A   Occupational History  . retired    Social History Main Topics  . Smoking status: Never Smoker   . Smokeless tobacco: Never Used  . Alcohol Use: No  . Drug Use: No  . Sexual Activity: No   Other Topics Concern  . Not on file   Social History Narrative   Widowed   2 children   Lives in [place]   Retired from [employer]    Past Surgical History  Procedure Laterality Date  . Cholecystectomy    . Appendectomy    . Spine surgery      cervical fusion  . Abdominal hysterectomy      THA and O  . Esophagogastroduodenoscopy      with HH and esophagitis  . Oophorectomy    . Neck surgery      Family History  Problem Relation Age of Onset  . Heart disease Mother   . Stroke Mother   . Prostate cancer Father   . Colon cancer Father 66    Allergies  Allergen Reactions  . Celebrex [Celecoxib] Rash    REACTION: rash    Current Outpatient Prescriptions on File Prior to Visit  Medication Sig Dispense Refill  . ALPRAZolam (XANAX) 1 MG tablet Take 1 tablet (  1 mg total) by mouth 2 (two) times daily. 60 tablet 2  . Calcium Carbonate-Vitamin D (CALTRATE 600+D) 600-400 MG-UNIT per chew tablet Chew 1 tablet by mouth 2 (two) times daily.      . Coenzyme Q10 (COQ10 PO) Take 1 tablet by mouth daily.     . Echinacea-Vitamin C 250-250 MG CAPS Take 1 capsule by mouth daily.     . fluticasone (FLOVENT HFA) 110 MCG/ACT inhaler Inhale 2 puffs into the lungs 2 (two) times daily. 1 Inhaler 11  . Magnesium 250 MG TABS Take 250 mg by mouth every morning.     . meloxicam (MOBIC) 7.5 MG tablet Take 1 tablet (7.5 mg total) by mouth daily. (Patient taking differently: Take 7.5 mg by mouth daily as needed for pain. ) 90 tablet 0  . metoprolol (LOPRESSOR) 50 MG tablet TAKE ONE TABLET BY MOUTH IN THE MORNING AND TAKE ONE-HALF TABLET BY MOUTH  IN THE EVENING - INCREASE IN DOSE (Patient taking differently: Take 25-50 mg by mouth 2 (two) times daily. TAKE ONE TABLET BY MOUTH IN THE MORNING AND TAKE  ONE-HALF TABLET BY MOUTH  IN THE EVENING - INCREASE IN DOSE) 135 tablet 1  . Multiple Vitamin (MULTIVITAMIN) tablet Take 1 tablet by mouth daily.       No current facility-administered medications on file prior to visit.    BP 120/62 mmHg  Pulse 92  Temp(Src) 98.5 F (36.9 C) (Oral)  Resp 18  Ht 5\' 6"  (1.676 m)  Wt 136 lb (61.689 kg)  BMI 21.96 kg/m2  SpO2 97%     Review of Systems  Constitutional: Positive for unexpected weight change.  HENT: Negative for congestion, dental problem, hearing loss, rhinorrhea, sinus pressure, sore throat and tinnitus.   Eyes: Negative for pain, discharge and visual disturbance.  Respiratory: Negative for cough and shortness of breath.   Cardiovascular: Negative for chest pain, palpitations and leg swelling.  Gastrointestinal: Negative for nausea, vomiting, abdominal pain, diarrhea, constipation, blood in stool and abdominal distention.  Genitourinary: Positive for flank pain. Negative for dysuria, urgency, frequency, hematuria, vaginal bleeding, vaginal discharge, difficulty urinating, vaginal pain and pelvic pain.  Musculoskeletal: Negative for joint swelling, arthralgias and gait problem.  Skin: Negative for rash.  Neurological: Negative for dizziness, syncope, speech difficulty, weakness, numbness and headaches.  Hematological: Negative for adenopathy.  Psychiatric/Behavioral: Negative for behavioral problems, dysphoric mood and agitation. The patient is not nervous/anxious.        Objective:   Physical Exam  Constitutional: She is oriented to person, place, and time. She appears well-developed and well-nourished. No distress.  HENT:  Head: Normocephalic.  Right Ear: External ear normal.  Left Ear: External ear normal.  Mouth/Throat: Oropharynx is clear and moist.  Eyes: Conjunctivae and EOM are normal. Pupils are equal, round, and reactive to light.  Neck: Normal range of motion. Neck supple. Thyromegaly present.  Cardiovascular:  Normal rate, normal heart sounds and intact distal pulses.   Rhythm is irregular  Pulmonary/Chest: Effort normal.  Marked decreased breath sounds left  lower hemithorax with dullness  Abdominal: Soft. Bowel sounds are normal. She exhibits no mass. There is no tenderness.  Musculoskeletal: Normal range of motion.  Lymphadenopathy:    She has no cervical adenopathy.  Neurological: She is alert and oriented to person, place, and time.  Skin: Skin is warm and dry. No rash noted.  Psychiatric: She has a normal mood and affect. Her behavior is normal.  Assessment & Plan:   Left flank discomfort.  Probable large left pleural effusion.  Will start with a chest x-ray to confirm Elevated blood sugar.  No prior history of diabetes.  Will check a hemoglobin A1c Essential hypertension, well-controlled History palpitations and bifascicular block.  Will check a EKG to rule out atrial fibrillation Status post recent UTI

## 2015-06-11 NOTE — Patient Instructions (Signed)
Chest x-ray as discussed  Limit your sodium (Salt) intake  Please check your blood pressure on a regular basis.  If it is consistently greater than 150/90, please make an office appointment.  Return in one month for follow-up

## 2015-06-11 NOTE — Progress Notes (Signed)
Pre visit review using our clinic review tool, if applicable. No additional management support is needed unless otherwise documented below in the visit note. 

## 2015-06-12 ENCOUNTER — Other Ambulatory Visit: Payer: Medicare Other

## 2015-06-12 ENCOUNTER — Telehealth: Payer: Self-pay | Admitting: *Deleted

## 2015-06-12 ENCOUNTER — Other Ambulatory Visit: Payer: Self-pay | Admitting: Internal Medicine

## 2015-06-12 ENCOUNTER — Ambulatory Visit (INDEPENDENT_AMBULATORY_CARE_PROVIDER_SITE_OTHER)
Admission: RE | Admit: 2015-06-12 | Discharge: 2015-06-12 | Disposition: A | Discharge status: Home / Self Care | Payer: Medicare Other | Source: Ambulatory Visit | Attending: Internal Medicine | Admitting: Internal Medicine

## 2015-06-12 DIAGNOSIS — R079 Chest pain, unspecified: Secondary | ICD-10-CM | POA: Diagnosis not present

## 2015-06-12 DIAGNOSIS — R9389 Abnormal findings on diagnostic imaging of other specified body structures: Secondary | ICD-10-CM

## 2015-06-12 DIAGNOSIS — J90 Pleural effusion, not elsewhere classified: Secondary | ICD-10-CM

## 2015-06-12 DIAGNOSIS — E119 Type 2 diabetes mellitus without complications: Secondary | ICD-10-CM

## 2015-06-12 NOTE — Telephone Encounter (Signed)
Received phone call from [facility]. Pt's chest x-ray showed apparent loculated effusion on the left, questionable mass vs infiltrate Left Upper Lobe, Right lobe clear. Radiologist is recommending CT Chest without contrast to evaluate further.   Dr. Raliegh Ip, notified of results as above and gave verbal order for CT Chest without contrast and he will call the pt.  Order for CT Chest put in EPIC.

## 2015-06-21 ENCOUNTER — Ambulatory Visit (INDEPENDENT_AMBULATORY_CARE_PROVIDER_SITE_OTHER)
Admission: RE | Admit: 2015-06-21 | Discharge: 2015-06-21 | Disposition: A | Discharge status: Home / Self Care | Payer: Medicare Other | Source: Ambulatory Visit | Attending: Internal Medicine | Admitting: Internal Medicine

## 2015-06-21 ENCOUNTER — Other Ambulatory Visit: Payer: Self-pay | Admitting: Internal Medicine

## 2015-06-21 DIAGNOSIS — J90 Pleural effusion, not elsewhere classified: Secondary | ICD-10-CM | POA: Diagnosis not present

## 2015-06-21 DIAGNOSIS — R938 Abnormal findings on diagnostic imaging of other specified body structures: Secondary | ICD-10-CM | POA: Diagnosis not present

## 2015-06-21 DIAGNOSIS — R9389 Abnormal findings on diagnostic imaging of other specified body structures: Secondary | ICD-10-CM

## 2015-06-22 ENCOUNTER — Ambulatory Visit (INDEPENDENT_AMBULATORY_CARE_PROVIDER_SITE_OTHER): Payer: Medicare Other | Admitting: Internal Medicine

## 2015-06-22 ENCOUNTER — Other Ambulatory Visit: Payer: Medicare Other

## 2015-06-22 ENCOUNTER — Encounter: Payer: Self-pay | Admitting: Internal Medicine

## 2015-06-22 VITALS — BP 118/62 | HR 86 | Ht 66.0 in | Wt 132.2 lb

## 2015-06-22 DIAGNOSIS — R634 Abnormal weight loss: Secondary | ICD-10-CM

## 2015-06-22 DIAGNOSIS — R0782 Intercostal pain: Secondary | ICD-10-CM

## 2015-06-22 DIAGNOSIS — J90 Pleural effusion, not elsewhere classified: Secondary | ICD-10-CM

## 2015-06-22 DIAGNOSIS — R911 Solitary pulmonary nodule: Secondary | ICD-10-CM

## 2015-06-22 MED ORDER — AMOXICILLIN-POT CLAVULANATE 875-125 MG PO TABS: ORAL_TABLET | ORAL | Status: DC

## 2015-06-22 NOTE — Progress Notes (Signed)
Subjective:    Patient ID: [name], female    DOB: 09-18-1936, 79 y.o.   MRN: [id number] PCP Nyoka Cowden, MD  HPI    IOV 06/22/2015  Chief Complaint  Patient presents with  . Pulmonary Consult    Pt referred by Dr. Dierdre Highman for abnormal CT.     79 year old African American female quite functional. Nonsmoker. Previously well. She reports insidious onset of left flank/left lower chest pain that is constant with no spastic aggravating factors but relieved with Mobic. Occasionally it can be severe. The course has been persistent. It is moderate in intensity for most of the time and occasionally mild occasionally severe. There is no radiation. This no associated fever or chills or nausea vomiting or diarrhea or aspiration events. On 5/28;/16 per chart reveiew  - went to ER. Seen by Dr Tomi Bamberger. Had WBC 19K aling with abnormal [place]. Rx UTI with levaquin. Then on 06/11/15 saw PCP Nyoka Cowden, MD and complained of persistent/slightly progressive pain. On his exam he found left reduced air entry in the left base. Chest x-ray showed mass versus loculated pleural effusion. This was then followed up with a CT of the chest 06/21/2015 which I personally visualized image and is reflected below. Therefore patient has been referred here.  At this point in time other than some mild weight loss which she attributes anabiotic therapy and lowered appetite she denies any other symptoms other than the pain. She feels good otherwise. She does have some associated Katie's teeth but this is only mild. Off note, patient is afraid to use Mobic because of side effects. She does not want surgical approach to a loculated effusion because of fear of surgery    IMPRESSION: - personally visualized image 1. Loculated left pleural effusion containing foci of gas may represent an and high hemo. Consider further evaluation with diagnostic thoracentesis. 2. 13 mm spiculated nodule in the left lower  lobe is identified. Cannot rule out small bronchogenic carcinoma. Recommend further evaluation with PET-CT following resolution of left-sided empyema.   Electronically Signed  By: Kerby Moors M.D.  On: 06/21/2015 11:47   Blood lab review 05/26/2015 white count is elevated at 19,000 with a normal creatinine of 0.6 minute exam percent.   has a past medical history of VITAMIN B12 DEFICIENCY (09/02/2007); HYPERLIPIDEMIA (12/09/2007); ESSENTIAL HYPERTENSION (10/29/2010); RBBB (09/05/2010); VENTRICULAR HYPERTROPHY, LEFT (08/12/2010); GERD (09/02/2007); DIVERTICULAR DISEASE (09/03/2010); CHOLELITHIASIS (09/03/2010); GALLSTONE PANCREATITIS (09/03/2010); ARTHRITIS (09/03/2010); OSTEOPOROSIS (05/17/2009); COLONIC POLYPS, HX OF (06/02/2008); HEART MURMUR, HX OF (08/12/2010); DYSPHAGIA UNSPECIFIED (09/03/2010); Palpitations (09/03/2010); WEIGHT LOSS, ABNORMAL (06/02/2008); Hiatal hernia; Hematuria; Polyuria; Insomnia; Paresthesia; Lichen simplex chronicus; Anxiety state; Low back pain; Fibromyalgia; Osteopenia; Asthma; and colonoscopy.   reports that she has never smoked. She has never used smokeless tobacco.  Past Surgical History  Procedure Laterality Date  . Cholecystectomy    . Appendectomy    . Spine surgery      cervical fusion  . Abdominal hysterectomy      THA and O  . Esophagogastroduodenoscopy      with HH and esophagitis  . Oophorectomy      Allergies  Allergen Reactions  . Celebrex [Celecoxib] Rash    REACTION: rash    Immunization History  Administered Date(s) Administered  . Influenza Split 10/01/2011, 10/11/2012  . Influenza Whole 09/30/2008, 09/27/2009, 09/25/2010  . Influenza,inj,Quad PF,36+ Mos 09/14/2013, 09/27/2014, 10/16/2014  . Pneumococcal Conjugate-13 03/21/2015  . Pneumococcal Polysaccharide-23 08/16/2014    Family History  Problem Relation Age of Onset  .  Heart disease Mother   . Stroke Mother   . Prostate cancer Father      Current outpatient prescriptions:  .   ALPRAZolam (XANAX) 1 MG tablet, Take 1 tablet (1 mg total) by mouth 2 (two) times daily., Disp: 60 tablet, Rfl: 2 .  Calcium Carbonate-Vitamin D (CALTRATE 600+D) 600-400 MG-UNIT per chew tablet, Chew 1 tablet by mouth 2 (two) times daily.  , Disp: , Rfl:  .  Coenzyme Q10 (COQ10 PO), Take 1 tablet by mouth daily. , Disp: , Rfl:  .  Echinacea-Vitamin C 250-250 MG CAPS, Take 1 capsule by mouth daily. , Disp: , Rfl:  .  fluticasone (FLOVENT HFA) 110 MCG/ACT inhaler, Inhale 2 puffs into the lungs 2 (two) times daily. (Patient taking differently: Inhale 2 puffs into the lungs 2 (two) times daily as needed. ), Disp: 1 Inhaler, Rfl: 11 .  Magnesium 250 MG TABS, Take 250 mg by mouth every morning. , Disp: , Rfl:  .  meloxicam (MOBIC) 7.5 MG tablet, Take 1 tablet (7.5 mg total) by mouth daily. (Patient taking differently: Take 7.5 mg by mouth daily as needed for pain. ), Disp: 90 tablet, Rfl: 0 .  metoprolol (LOPRESSOR) 50 MG tablet, TAKE ONE TABLET BY MOUTH IN THE MORNING AND TAKE ONE-HALF TABLET BY MOUTH  IN THE EVENING - INCREASE IN DOSE (Patient taking differently: Take 25-50 mg by mouth 2 (two) times daily. TAKE ONE TABLET BY MOUTH IN THE MORNING AND TAKE ONE-HALF TABLET BY MOUTH  IN THE EVENING - INCREASE IN DOSE), Disp: 135 tablet, Rfl: 1 .  Multiple Vitamin (MULTIVITAMIN) tablet, Take 1 tablet by mouth daily.  , Disp: , Rfl:       Review of Systems  Constitutional: Negative for fever and unexpected weight change.  HENT: Negative for congestion, dental problem, ear pain, nosebleeds, postnasal drip, rhinorrhea, sinus pressure, sneezing, sore throat and trouble swallowing.   Eyes: Negative for redness and itching.  Respiratory: Negative for cough, chest tightness, shortness of breath and wheezing.   Cardiovascular: Positive for chest pain. Negative for palpitations and leg swelling.  Gastrointestinal: Negative for nausea and vomiting.  Genitourinary: Negative for dysuria.  Musculoskeletal: Negative  for joint swelling.  Skin: Negative for rash.  Neurological: Negative for headaches.  Hematological: Does not bruise/bleed easily.  Psychiatric/Behavioral: Negative for dysphoric mood. The patient is not nervous/anxious.        Objective:   Physical Exam  Constitutional: She is oriented to person, place, and time. She appears well-developed and well-nourished. No distress.  HENT:  Head: Normocephalic and atraumatic.  Right Ear: External ear normal.  Left Ear: External ear normal.  Mouth/Throat: Oropharynx is clear and moist. No oropharyngeal exudate.  Mild caries +  Eyes: Conjunctivae and EOM are normal. Pupils are equal, round, and reactive to light. Right eye exhibits no discharge. Left eye exhibits no discharge. No scleral icterus.  Neck: Normal range of motion. Neck supple. No JVD present. No tracheal deviation present. No thyromegaly present.  Cardiovascular: Normal rate, regular rhythm, normal heart sounds and intact distal pulses.  Exam reveals no gallop and no friction rub.   No murmur heard. Pulmonary/Chest: Effort normal and breath sounds normal. No respiratory distress. She has no wheezes. She has no rales. She exhibits no tenderness.  Left base of lung dull and diminished air entry  Abdominal: Soft. Bowel sounds are normal. She exhibits no distension and no mass. There is no tenderness. There is no rebound and no guarding.  Musculoskeletal: Normal  range of motion. She exhibits no edema or tenderness.  Lymphadenopathy:    She has no cervical adenopathy.  Neurological: She is alert and oriented to person, place, and time. She has normal reflexes. No cranial nerve deficit. She exhibits normal muscle tone. Coordination normal.  Skin: Skin is warm and dry. No rash noted. She is not diaphoretic. No erythema. No pallor.  Psychiatric: She has a normal mood and affect. Her behavior is normal. Judgment and thought content normal.  Vitals reviewed.   Filed Vitals:   06/22/15 1037    BP: 118/62  Pulse: 86  Height: 5\' 6"  (1.676 m)  Weight: 132 lb 3.2 oz (59.966 kg)  SpO2: 98%         Assessment & Plan:  Intercostal pain - Plan: ANA, Anti-DNA antibody, double-stranded, Cyclic citrul peptide antibody, IgG, Rheumatoid factor  Loculated pleural effusion - Plan: ANA, Anti-DNA antibody, double-stranded, Cyclic citrul peptide antibody, IgG, Rheumatoid factor  Lung nodule - Plan: ANA, Anti-DNA antibody, double-stranded, Cyclic citrul peptide antibody, IgG, Rheumatoid factor  Loss of weight - Plan: ANA, Anti-DNA antibody, double-stranded, Cyclic citrul peptide antibody, IgG, Rheumatoid factor    #Chest pain = She is afraid to use Mobic  - keep mobic use to one every few days  -=try tylenol for pain  #Lung nodule  - will monitor with serial CT  #Loculated left pleural effusion - as discussed your preference is to avoid surgical option  - do ANA, DS-DNA, CCP, RF antibody looking for autoimmune cause  - augmentin 875mg  twice daily x 3 weeks  - caution: diarrhea, gi intolerance, rash - let us know if any of this develop  #Weight loss - could be related to effusion  - monitor  #FOllowup  3 weeks with CXR: see me  Or NP TAmmy     Dr. Brand Males, M.D., [facility].C.P Pulmonary and Critical Care Medicine Staff Physician [place] [facility] Pager: [phone], If no answer or between  15:00h - 7:00h: call [phone]  06/22/2015 11:50 AM

## 2015-06-22 NOTE — Patient Instructions (Addendum)
ICD-9-CM ICD-10-CM   1. Intercostal pain 786.59 R07.82   2. Loculated pleural effusion 511.9 J90   3. Lung nodule 793.11 R91.1     #Chest pain  - keep mobic use to one every few days  -=try tylenol for pain  #Lung nodule  - will monitor with serial CT  #Loculated left pleural effusion - as discussed your preference is to avoid surgical option  - do ANA, DS-DNA, CCP, RF antibody looking for autoimmune cause  - augmentin 875mg  twice daily x 3 weeks  - caution: diarrhea, gi intolerance, rash - let us know if any of this develop  #Weight loss - could be related to effusion  - monitor  #FOllowup  3 weeks with CXR: see me  Or NP TAmmy

## 2015-06-23 LAB — RHEUMATOID FACTOR: Rhuematoid fact SerPl-aCnc: 16 IU/mL — ABNORMAL HIGH (ref <=14)

## 2015-06-25 LAB — ANA: ANA: POSITIVE — AB

## 2015-06-25 LAB — ANTI-NUCLEAR AB-TITER (ANA TITER): ANA Titer 1: 1:40 {titer} — ABNORMAL HIGH

## 2015-06-25 LAB — ANTI-DNA ANTIBODY, DOUBLE-STRANDED: ds DNA Ab: <1 IU/mL

## 2015-06-25 LAB — CYCLIC CITRUL PEPTIDE ANTIBODY, IGG: Cyclic Citrullin Peptide Ab: <2 U/mL (ref 0.0–5.0)

## 2015-06-27 ENCOUNTER — Telehealth: Payer: Self-pay | Admitting: Internal Medicine

## 2015-06-27 NOTE — Telephone Encounter (Signed)
Autoimmune 06/22/15 - trace positive ANA and RF but given age 50years all likely within normal limites. No change in fu plan of last OV

## 2015-06-28 ENCOUNTER — Ambulatory Visit (INDEPENDENT_AMBULATORY_CARE_PROVIDER_SITE_OTHER): Payer: Medicare Other | Admitting: Internal Medicine

## 2015-06-28 ENCOUNTER — Other Ambulatory Visit: Payer: Self-pay | Admitting: *Deleted

## 2015-06-28 ENCOUNTER — Encounter: Payer: Self-pay | Admitting: Internal Medicine

## 2015-06-28 DIAGNOSIS — J90 Pleural effusion, not elsewhere classified: Secondary | ICD-10-CM | POA: Diagnosis not present

## 2015-06-28 DIAGNOSIS — R634 Abnormal weight loss: Secondary | ICD-10-CM | POA: Diagnosis not present

## 2015-06-28 DIAGNOSIS — E1165 Type 2 diabetes mellitus with hyperglycemia: Secondary | ICD-10-CM | POA: Diagnosis not present

## 2015-06-28 DIAGNOSIS — R911 Solitary pulmonary nodule: Secondary | ICD-10-CM

## 2015-06-28 DIAGNOSIS — I10 Essential (primary) hypertension: Secondary | ICD-10-CM

## 2015-06-28 DIAGNOSIS — IMO0002 Reserved for concepts with insufficient information to code with codable children: Secondary | ICD-10-CM | POA: Insufficient documentation

## 2015-06-28 LAB — GLUCOSE, POCT (MANUAL RESULT ENTRY): POC GLUCOSE: 104 mg/dL — AB (ref 70–99)

## 2015-06-28 MED ORDER — ALPRAZOLAM 1 MG PO TABS: 1.0000 mg | ORAL_TABLET | Freq: Two times a day (BID) | ORAL | Status: DC

## 2015-06-28 NOTE — Progress Notes (Signed)
Subjective:    Patient ID: [name], female    DOB: 1936/09/17, 79 y.o.   MRN: [id number]  HPI 79 year old patient who is seen today in follow-up.  She was seen in the ED recently and noted to have hyperglycemia and glycosuria.  Hemoglobin A1c apparently not drawn.  No prior history of diabetes She has been referred to pulmonary medicine due to a pulmonary nodule and a left pleural effusion.  The patient is completing antibiotic therapy.  She generally feels well.  She states her weight is up and she has very little left flank discomfort.  No hypoglycemic symptoms  Wt Readings from Last 3 Encounters:  06/28/15 133 lb (60.328 kg)  06/22/15 132 lb 3.2 oz (59.966 kg)  06/11/15 136 lb (61.689 kg)    Past Medical History  Diagnosis Date  . VITAMIN B12 DEFICIENCY 09/02/2007  . HYPERLIPIDEMIA 12/09/2007  . ESSENTIAL HYPERTENSION 10/29/2010  . RBBB 09/05/2010  . VENTRICULAR HYPERTROPHY, LEFT 08/12/2010  . GERD 09/02/2007  . DIVERTICULAR DISEASE 09/03/2010  . CHOLELITHIASIS 09/03/2010  . GALLSTONE PANCREATITIS 09/03/2010  . ARTHRITIS 09/03/2010  . OSTEOPOROSIS 05/17/2009  . COLONIC POLYPS, HX OF 06/02/2008  . HEART MURMUR, HX OF 08/12/2010  . DYSPHAGIA UNSPECIFIED 09/03/2010  . Palpitations 09/03/2010    event monitor reveals long RP tachycardia, likely sinus tachycardia  . WEIGHT LOSS, ABNORMAL 06/02/2008  . Hiatal hernia   . Hematuria   . Polyuria   . Insomnia   . Paresthesia   . Lichen simplex chronicus   . Anxiety state     unspecified  . Low back pain     chronic  . Fibromyalgia   . Osteopenia   . Asthma   . Hx of colonoscopy     2014    History   Social History  . Marital Status: Widowed    Spouse Name: N/A  . Number of Children: 6  . Years of Education: N/A   Occupational History  . retired    Social History Main Topics  . Smoking status: Never Smoker   . Smokeless tobacco: Never Used  . Alcohol Use: No  . Drug Use: No  . Sexual Activity: No   Other Topics Concern  .  Not on file   Social History Narrative   Widowed   2 children   Lives in [place]   Retired from [employer]    Past Surgical History  Procedure Laterality Date  . Cholecystectomy    . Appendectomy    . Spine surgery      cervical fusion  . Abdominal hysterectomy      THA and O  . Esophagogastroduodenoscopy      with HH and esophagitis  . Oophorectomy      Family History  Problem Relation Age of Onset  . Heart disease Mother   . Stroke Mother   . Prostate cancer Father     Allergies  Allergen Reactions  . Celebrex [Celecoxib] Rash    REACTION: rash    Current Outpatient Prescriptions on File Prior to Visit  Medication Sig Dispense Refill  . ALPRAZolam (XANAX) 1 MG tablet Take 1 tablet (1 mg total) by mouth 2 (two) times daily. 60 tablet 2  . amoxicillin-clavulanate (AUGMENTIN) 875-125 MG per tablet Take 1 tablet twice daily for 3 weeks. 120 tablet 0  . Calcium Carbonate-Vitamin D (CALTRATE 600+D) 600-400 MG-UNIT per chew tablet Chew 1 tablet by mouth 2 (two) times daily.      . Coenzyme  Q10 (COQ10 PO) Take 1 tablet by mouth daily.     . Echinacea-Vitamin C 250-250 MG CAPS Take 1 capsule by mouth daily.     . fluticasone (FLOVENT HFA) 110 MCG/ACT inhaler Inhale 2 puffs into the lungs 2 (two) times daily. (Patient taking differently: Inhale 2 puffs into the lungs 2 (two) times daily as needed. ) 1 Inhaler 11  . Magnesium 250 MG TABS Take 250 mg by mouth every morning.     . meloxicam (MOBIC) 7.5 MG tablet Take 1 tablet (7.5 mg total) by mouth daily. (Patient taking differently: Take 7.5 mg by mouth daily as needed for pain. ) 90 tablet 0  . metoprolol (LOPRESSOR) 50 MG tablet TAKE ONE TABLET BY MOUTH IN THE MORNING AND TAKE ONE-HALF TABLET BY MOUTH  IN THE EVENING - INCREASE IN DOSE (Patient taking differently: Take 25-50 mg by mouth 2 (two) times daily. TAKE ONE TABLET BY MOUTH IN THE MORNING AND TAKE ONE-HALF TABLET BY MOUTH  IN THE EVENING - INCREASE IN DOSE) 135 tablet  1  . Multiple Vitamin (MULTIVITAMIN) tablet Take 1 tablet by mouth daily.       No current facility-administered medications on file prior to visit.    BP 120/66 mmHg  Pulse 79  Temp(Src) 98 F (36.7 C) (Oral)  Resp 18  Ht 5\' 6"  (1.676 m)  Wt 133 lb (60.328 kg)  BMI 21.48 kg/m2  SpO2 98%      Review of Systems  Constitutional: Positive for unexpected weight change.  HENT: Negative for congestion, dental problem, hearing loss, rhinorrhea, sinus pressure, sore throat and tinnitus.   Eyes: Negative for pain, discharge and visual disturbance.  Respiratory: Negative for cough and shortness of breath.   Cardiovascular: Negative for chest pain, palpitations and leg swelling.  Gastrointestinal: Negative for nausea, vomiting, abdominal pain, diarrhea, constipation, blood in stool and abdominal distention.  Genitourinary: Positive for flank pain. Negative for dysuria, urgency, frequency, hematuria, vaginal bleeding, vaginal discharge, difficulty urinating, vaginal pain and pelvic pain.  Musculoskeletal: Negative for joint swelling, arthralgias and gait problem.  Skin: Negative for rash.  Neurological: Negative for dizziness, syncope, speech difficulty, weakness, numbness and headaches.  Hematological: Negative for adenopathy.  Psychiatric/Behavioral: Negative for behavioral problems, dysphoric mood and agitation. The patient is not nervous/anxious.        Objective:   Physical Exam  Constitutional: She is oriented to person, place, and time. She appears well-developed and well-nourished. No distress.  HENT:  Head: Normocephalic.  Right Ear: External ear normal.  Left Ear: External ear normal.  Mouth/Throat: Oropharynx is clear and moist.  Eyes: Conjunctivae and EOM are normal. Pupils are equal, round, and reactive to light.  Neck: Normal range of motion. Neck supple. No thyromegaly present.  Cardiovascular: Normal rate, regular rhythm, normal heart sounds and intact distal pulses.    Pulmonary/Chest: Effort normal and breath sounds normal.  Dullness and diminished breath sounds left hemithorax, unchanged as  Abdominal: Soft. Bowel sounds are normal. She exhibits no mass. There is no tenderness.  Musculoskeletal: Normal range of motion.  Lymphadenopathy:    She has no cervical adenopathy.  Neurological: She is alert and oriented to person, place, and time.  Skin: Skin is warm and dry. No rash noted.  Psychiatric: She has a normal mood and affect. Her behavior is normal.          Assessment & Plan:      Rule out diabetes.  Patient has had a random blood sugar in  excess of 200 as well as significant glycosuria.  Random blood sugar, presently 104.  We'll check a hemoglobin A1c Essential hypertension, stable Left pleural effusion.  Pulmonary evaluation in process.  The patient does have a low titer AMA and slightly positive RA titer.  Will complete antibiotic therapy Anxiety disorder.  Alprazolam refilled  We'll check a CBC (history leukocytosis) and bmet (history of hyperglycemia and hyponatremia)  Pulmonary follow-up  Recheck here one month

## 2015-06-28 NOTE — Telephone Encounter (Signed)
I spoke with patient about results and she verbalized understanding and had no questions 

## 2015-06-28 NOTE — Telephone Encounter (Signed)
lmtcb for pt.  

## 2015-06-28 NOTE — Patient Instructions (Addendum)
Pulmonary follow-up as scheduled  Return in one month for follow-up

## 2015-06-28 NOTE — Telephone Encounter (Signed)
Pt returning call and can be reached @ [phone].[name]

## 2015-06-28 NOTE — Progress Notes (Signed)
Pre visit review using our clinic review tool, if applicable. No additional management support is needed unless otherwise documented below in the visit note. 

## 2015-07-11 ENCOUNTER — Ambulatory Visit: Payer: Medicare Other | Admitting: Internal Medicine

## 2015-07-12 ENCOUNTER — Ambulatory Visit: Payer: Medicare Other | Admitting: Adult Health

## 2015-07-12 ENCOUNTER — Encounter: Payer: Self-pay | Admitting: Internal Medicine

## 2015-07-12 ENCOUNTER — Ambulatory Visit (INDEPENDENT_AMBULATORY_CARE_PROVIDER_SITE_OTHER): Payer: Medicare Other | Admitting: Internal Medicine

## 2015-07-12 VITALS — BP 118/60 | HR 79 | Temp 97.5°F | Resp 18 | Ht 66.0 in | Wt 131.0 lb

## 2015-07-12 DIAGNOSIS — R634 Abnormal weight loss: Secondary | ICD-10-CM

## 2015-07-12 DIAGNOSIS — R739 Hyperglycemia, unspecified: Secondary | ICD-10-CM | POA: Diagnosis not present

## 2015-07-12 DIAGNOSIS — I10 Essential (primary) hypertension: Secondary | ICD-10-CM

## 2015-07-12 DIAGNOSIS — R911 Solitary pulmonary nodule: Secondary | ICD-10-CM

## 2015-07-12 DIAGNOSIS — J90 Pleural effusion, not elsewhere classified: Secondary | ICD-10-CM

## 2015-07-12 LAB — CBC WITH DIFFERENTIAL/PLATELET
Basophils Absolute: 0 10*3/uL (ref 0.0–0.1)
Basophils Relative: 0.6 % (ref 0.0–3.0)
EOS ABS: 0.2 10*3/uL (ref 0.0–0.7)
Eosinophils Relative: 3.7 % (ref 0.0–5.0)
HCT: 34.6 % — ABNORMAL LOW (ref 36.0–46.0)
Hemoglobin: 11.5 g/dL — ABNORMAL LOW (ref 12.0–15.0)
Lymphocytes Relative: 23.6 % (ref 12.0–46.0)
Lymphs Abs: 1.3 10*3/uL (ref 0.7–4.0)
MCHC: 33.2 g/dL (ref 30.0–36.0)
MCV: 86.7 fl (ref 78.0–100.0)
MONOS PCT: 9.4 % (ref 3.0–12.0)
Monocytes Absolute: 0.5 10*3/uL (ref 0.1–1.0)
NEUTROS ABS: 3.4 10*3/uL (ref 1.4–7.7)
NEUTROS PCT: 62.7 % (ref 43.0–77.0)
Platelets: 379 10*3/uL (ref 150.0–400.0)
RBC: 3.99 Mil/uL (ref 3.87–5.11)
RDW: 14.8 % (ref 11.5–15.5)
WBC: 5.4 10*3/uL (ref 4.0–10.5)

## 2015-07-12 LAB — BASIC METABOLIC PANEL
BUN: 10 mg/dL (ref 6–23)
CALCIUM: 9.4 mg/dL (ref 8.4–10.5)
CHLORIDE: 103 meq/L (ref 96–112)
CO2: 30 mEq/L (ref 19–32)
CREATININE: 0.53 mg/dL (ref 0.40–1.20)
GFR: 143.68 mL/min (ref >60.00)
Glucose, Bld: 123 mg/dL — ABNORMAL HIGH (ref 70–99)
Potassium: 3.2 mEq/L — ABNORMAL LOW (ref 3.5–5.1)
SODIUM: 138 meq/L (ref 135–145)

## 2015-07-12 LAB — SEDIMENTATION RATE: Sed Rate: 52 mm/hr — ABNORMAL HIGH (ref 0–22)

## 2015-07-12 LAB — HEMOGLOBIN A1C: HEMOGLOBIN A1C: 6.3 % (ref 4.6–6.5)

## 2015-07-12 MED ORDER — MELOXICAM 7.5 MG PO TABS: 7.5000 mg | ORAL_TABLET | Freq: Every day | ORAL | Status: DC

## 2015-07-12 NOTE — Progress Notes (Signed)
Pre visit review using our clinic review tool, if applicable. No additional management support is needed unless otherwise documented below in the visit note. 

## 2015-07-12 NOTE — Progress Notes (Signed)
Subjective:    Patient ID: [name], female    DOB: [birth year], 79 y.o.   MRN: [id number]  HPI  79 year old patient who is seen today in follow-up.  She has been followed by pulmonary medicine due to a left lower lobe lung nodule as well as a loculated pleural effusion.  She declined aggressive diagnostic studies and is completing 3 weeks of antibiotic therapy.  She generally feels well.  She only has occasional left flank pain when she changes position in bed.  No pulmonary symptoms She has a history of hyperglycemia and leukocytosis.  Follow-up laboratory studies were apparently not done last visit.  She is scheduled for pulmonary follow-up next month   Past Medical History  Diagnosis Date  . VITAMIN B12 DEFICIENCY 09/02/2007  . HYPERLIPIDEMIA 12/09/2007  . ESSENTIAL HYPERTENSION 10/29/2010  . RBBB 09/05/2010  . VENTRICULAR HYPERTROPHY, LEFT 08/12/2010  . GERD 09/02/2007  . DIVERTICULAR DISEASE 09/03/2010  . CHOLELITHIASIS 09/03/2010  . GALLSTONE PANCREATITIS 09/03/2010  . ARTHRITIS 09/03/2010  . OSTEOPOROSIS 05/17/2009  . COLONIC POLYPS, HX OF 06/02/2008  . HEART MURMUR, HX OF 08/12/2010  . DYSPHAGIA UNSPECIFIED 09/03/2010  . Palpitations 09/03/2010    event monitor reveals long RP tachycardia, likely sinus tachycardia  . WEIGHT LOSS, ABNORMAL 06/02/2008  . Hiatal hernia   . Hematuria   . Polyuria   . Insomnia   . Paresthesia   . Lichen simplex chronicus   . Anxiety state     unspecified  . Low back pain     chronic  . Fibromyalgia   . Osteopenia   . Asthma   . Hx of colonoscopy     2014    History   Social History  . Marital Status: Widowed    Spouse Name: N/A  . Number of Children: 6  . Years of Education: N/A   Occupational History  . retired    Social History Main Topics  . Smoking status: Never Smoker   . Smokeless tobacco: Never Used  . Alcohol Use: No  . Drug Use: No  . Sexual Activity: No   Other Topics Concern  . Not on file   Social History Narrative   Widowed   2 children   Lives in [place]   Retired from [employer]    Past Surgical History  Procedure Laterality Date  . Cholecystectomy    . Appendectomy    . Spine surgery      cervical fusion  . Abdominal hysterectomy      THA and O  . Esophagogastroduodenoscopy      with HH and esophagitis  . Oophorectomy      Family History  Problem Relation Age of Onset  . Heart disease Mother   . Stroke Mother   . Prostate cancer Father     Allergies  Allergen Reactions  . Celebrex [Celecoxib] Rash    REACTION: rash    Current Outpatient Prescriptions on File Prior to Visit  Medication Sig Dispense Refill  . ALPRAZolam (XANAX) 1 MG tablet Take 1 tablet (1 mg total) by mouth 2 (two) times daily. 60 tablet 2  . amoxicillin-clavulanate (AUGMENTIN) 875-125 MG per tablet Take 1 tablet twice daily for 3 weeks. 120 tablet 0  . Calcium Carbonate-Vitamin D (CALTRATE 600+D) 600-400 MG-UNIT per chew tablet Chew 1 tablet by mouth 2 (two) times daily.      . Coenzyme Q10 (COQ10 PO) Take 1 tablet by mouth daily.     . Echinacea-Vitamin C  250-250 MG CAPS Take 1 capsule by mouth daily.     . fluticasone (FLOVENT HFA) 110 MCG/ACT inhaler Inhale 2 puffs into the lungs 2 (two) times daily. (Patient taking differently: Inhale 2 puffs into the lungs 2 (two) times daily as needed. ) 1 Inhaler 11  . Magnesium 250 MG TABS Take 250 mg by mouth every morning.     . metoprolol (LOPRESSOR) 50 MG tablet TAKE ONE TABLET BY MOUTH IN THE MORNING AND TAKE ONE-HALF TABLET BY MOUTH  IN THE EVENING - INCREASE IN DOSE (Patient taking differently: Take 25-50 mg by mouth 2 (two) times daily. TAKE ONE TABLET BY MOUTH IN THE MORNING AND TAKE ONE-HALF TABLET BY MOUTH  IN THE EVENING - INCREASE IN DOSE) 135 tablet 1  . Multiple Vitamin (MULTIVITAMIN) tablet Take 1 tablet by mouth daily.       No current facility-administered medications on file prior to visit.    BP 118/60 mmHg  Pulse 79  Temp(Src) 97.5 F (36.4 C)  (Oral)  Resp 18  Ht 5\' 6"  (1.676 m)  Wt 131 lb (59.421 kg)  BMI 21.15 kg/m2  SpO2 98%     Review of Systems  Constitutional: Negative.   HENT: Negative for congestion, dental problem, hearing loss, rhinorrhea, sinus pressure, sore throat and tinnitus.   Eyes: Negative for pain, discharge and visual disturbance.  Respiratory: Negative for cough and shortness of breath.   Cardiovascular: Negative for chest pain, palpitations and leg swelling.  Gastrointestinal: Negative for nausea, vomiting, abdominal pain, diarrhea, constipation, blood in stool and abdominal distention.  Genitourinary: Positive for flank pain. Negative for dysuria, urgency, frequency, hematuria, vaginal bleeding, vaginal discharge, difficulty urinating, vaginal pain and pelvic pain.  Musculoskeletal: Negative for joint swelling, arthralgias and gait problem.  Skin: Negative for rash.  Neurological: Negative for dizziness, syncope, speech difficulty, weakness, numbness and headaches.  Hematological: Negative for adenopathy.  Psychiatric/Behavioral: Negative for behavioral problems, dysphoric mood and agitation. The patient is not nervous/anxious.        Objective:   Physical Exam  Constitutional: She is oriented to person, place, and time. She appears well-developed and well-nourished. No distress.  HENT:  Head: Normocephalic.  Right Ear: External ear normal.  Left Ear: External ear normal.  Mouth/Throat: Oropharynx is clear and moist.  Eyes: Conjunctivae and EOM are normal. Pupils are equal, round, and reactive to light.  Neck: Normal range of motion. Neck supple. No thyromegaly present.  Cardiovascular: Normal rate, regular rhythm, normal heart sounds and intact distal pulses.   Pulmonary/Chest: Effort normal.  Dullness and diminished breath sounds at the left lower lung fields, unchanged  Abdominal: Soft. Bowel sounds are normal. She exhibits no mass. There is no tenderness.  Musculoskeletal: Normal range of  motion.  Lymphadenopathy:    She has no cervical adenopathy.  Neurological: She is alert and oriented to person, place, and time.  Skin: Skin is warm and dry. No rash noted.  Psychiatric: She has a normal mood and affect. Her behavior is normal.          Assessment & Plan:   Pulmonary nodule left lower lung field with loculated left effusion.  No prior history of tobacco use.  Will complete 3 weeks of antibiotic therapy.  Follow-up pulmonary With follow-up chest x-ray History of hyperglycemia.  Will check a hemoglobin A1c History of leukocytosis.  We'll check a follow-up CBC Essential hypertension, stable

## 2015-07-12 NOTE — Patient Instructions (Signed)
Follow-up pulmonary medicine next month as scheduled  Report any clinical change, chest pain, weight loss  Return in 3 months for follow-up

## 2015-07-26 ENCOUNTER — Ambulatory Visit: Payer: Medicare Other | Admitting: Internal Medicine

## 2015-08-06 ENCOUNTER — Encounter: Payer: Self-pay | Admitting: Adult Health

## 2015-08-06 ENCOUNTER — Ambulatory Visit (INDEPENDENT_AMBULATORY_CARE_PROVIDER_SITE_OTHER): Payer: Medicare Other | Admitting: Adult Health

## 2015-08-06 ENCOUNTER — Ambulatory Visit (INDEPENDENT_AMBULATORY_CARE_PROVIDER_SITE_OTHER)
Admission: RE | Admit: 2015-08-06 | Discharge: 2015-08-06 | Disposition: A | Discharge status: Home / Self Care | Payer: Medicare Other | Source: Ambulatory Visit | Attending: Adult Health | Admitting: Adult Health

## 2015-08-06 VITALS — BP 102/62 | HR 68 | Temp 97.8°F | Ht 66.0 in | Wt 130.0 lb

## 2015-08-06 DIAGNOSIS — J929 Pleural plaque without asbestos: Secondary | ICD-10-CM | POA: Diagnosis not present

## 2015-08-06 DIAGNOSIS — J90 Pleural effusion, not elsewhere classified: Secondary | ICD-10-CM

## 2015-08-06 DIAGNOSIS — R911 Solitary pulmonary nodule: Secondary | ICD-10-CM

## 2015-08-06 DIAGNOSIS — R918 Other nonspecific abnormal finding of lung field: Secondary | ICD-10-CM | POA: Diagnosis not present

## 2015-08-06 NOTE — Patient Instructions (Addendum)
Continue on current regimen Follow up with Dr. Chase Caller in 3-4 weeks with chest xray  Please contact office for sooner follow up if symptoms do not improve or worsen or seek emergency care    Late add : set up for follow up with Dr. Chase Caller in 6 weeks with CT chest prior to visit.

## 2015-08-06 NOTE — Progress Notes (Signed)
   Subjective:    Patient ID: [name], female    DOB: 08-20-1936, 79 y.o.   MRN: [id number]  HPI 79 yo female never smoker with abnormal CT  08/06/2015 Follow up : Pleural Effusion  Patient returns for a 6 week follow-up. Patient was seen for initial pulmonary consult on June 24 for an abnormal CT.  CT chest on June 23 showed a loculated left pleural effusion with foci of gas  She was started on 3 weeks of augmentin .  Labs showed autoimmune panel was trace positive for ANA and RF . Felt not sign for her age and acute illness.  She says she is feeling better .  Pt states breathing is doing well and left sided soreness is better. No new compliants.  CXR done today shows improved aeration and no definite nodule seen in lung base.  She denies fever, chest pain, orthopnea, edema.     Review of Systems Constitutional:   +  weight loss,  No night sweats,  Fevers, chills,  +fatigue, or  lassitude.  HEENT:   No headaches,  Difficulty swallowing,  Tooth/dental problems, or  Sore throat,                No sneezing, itching, ear ache, nasal congestion, post nasal drip,   CV:  No chest pain,  Orthopnea, PND, swelling in lower extremities, anasarca, dizziness, palpitations, syncope.   GI  No heartburn, indigestion, abdominal pain, nausea, vomiting, diarrhea, change in bowel habits, loss of appetite, bloody stools.   Resp:    No change in color of mucus.  No wheezing.  No chest wall deformity  Skin: no rash or lesions.  GU: no dysuria, change in color of urine, no urgency or frequency.  No flank pain, no hematuria   MS:  No joint pain or swelling.  No decreased range of motion.  No back pain.  Psych:  No change in mood or affect. No depression or anxiety.  No memory loss.         Objective:   Physical Exam  GEN: A/Ox3; pleasant , NAD, thin and frail   HEENT:  [place]/AT,  EACs-clear, TMs-wnl, NOSE-clear, THROAT-clear, no lesions, no postnasal drip or exudate noted. Poor dentition     NECK:  Supple w/ fair ROM; no JVD; normal carotid impulses w/o bruits; no thyromegaly or nodules palpated; no lymphadenopathy.  RESP  Clear  P & A; w/o, wheezes/ rales/ or rhonchi.no accessory muscle use, no dullness to percussion  CARD:  RRR, no m/r/g  , no peripheral edema, pulses intact, no cyanosis or clubbing.  GI:   Soft & nt; nml bowel sounds; no organomegaly or masses detected.  Musco: Warm bil, no deformities or joint swelling noted.   Neuro: alert, no focal deficits noted.    Skin: Warm, no lesions or rashes   CXR 08/06/15 , reviewed independently   The nodule noted by CT at the left lung base is not definitely seen by plain film. Recommend CT of the chest to assess stability. 2. Decrease in pleural thickening along the posterolateral left hemi thorax in this patient with history of empyema.     Assessment & Plan:

## 2015-08-07 ENCOUNTER — Telehealth: Payer: Self-pay | Admitting: Adult Health

## 2015-08-07 DIAGNOSIS — R911 Solitary pulmonary nodule: Secondary | ICD-10-CM

## 2015-08-07 DIAGNOSIS — J90 Pleural effusion, not elsewhere classified: Secondary | ICD-10-CM

## 2015-08-07 NOTE — Telephone Encounter (Signed)
Per TP Pt needs CT chest without contrast for left pleural effusion and lung nodule.  And a 6 wk follow up with MR.  Called and spoke with pt Scheduled her for 09/27/15 with MR And have placed order for CT. Pt voiced understanding and is aware [facility] will be calling to schedule CT Pt had no further questions Nothing further needed

## 2015-08-07 NOTE — Assessment & Plan Note (Signed)
Repeat CT chest in 6 weeks  

## 2015-08-07 NOTE — Assessment & Plan Note (Signed)
Loculated left effusion - she is improved with prolonged abx  cxr shows improved aeration  Case and films reviewed with Dr. Chase Caller  Will have her return in 6 weeks with CT chest .  Please contact office for sooner follow up if symptoms do not improve or worsen or seek emergency care

## 2015-08-15 ENCOUNTER — Ambulatory Visit: Payer: Medicare Other | Admitting: Cardiovascular Disease

## 2015-08-22 NOTE — Progress Notes (Signed)
Patient ID: [name], female   DOB: [birth year], 79 y.o.   MRN: [id number] [name] is seen today for f/U of palpitations She has had daily palptations when initially seen 2011 . No previous heart history. ? History of murmur. Indicates they are not related to exertion. Made worse by caffeine. [facility] with RBBB. She feels more jittery in general and may be having an anxiety state. Reviewed lab work form 8/15 and TSH was normal. No SSCP dyspnea or syncope. Palpitatons can last minutes and occure daily.  Monitor subsequently documented long R-P tachycardia and patient seen by Dr Rayann Heman recently. Recommended continued beta blocker Rx  No significant palpitations  Taking lopressor bid once in am   Seeing Pulmonary for loculated left pleural effusion and 89mm lung nodule.  Needs f/u CT/MRI in September.   Recent UTI with some weight loss    ROS: Denies fever, malais, weight loss, blurry vision, decreased visual acuity, cough, sputum, SOB, hemoptysis, pleuritic pain, palpitaitons, heartburn, abdominal pain, melena, lower extremity edema, claudication, or rash.  All other systems reviewed and negative  General: Affect appropriate Healthy:  appears stated age 79: normal Neck supple with no adenopathy JVP normal no bruits no thyromegaly Lungs clear with no wheezing and good diaphragmatic motion Heart:  S1/S2 SEM  murmur, no rub, gallop or click PMI normal Abdomen: benighn, BS positve, no tenderness, no AAA no bruit.  No HSM or HJR Distal pulses intact with no bruits No edema Neuro non-focal Skin warm and dry No muscular weakness   Current Outpatient Prescriptions  Medication Sig Dispense Refill  . ALPRAZolam (XANAX) 1 MG tablet Take 1 tablet (1 mg total) by mouth 2 (two) times daily. 60 tablet 2  . Calcium Carbonate-Vitamin D (CALTRATE 600+D) 600-400 MG-UNIT per chew tablet Chew 1 tablet by mouth 2 (two) times daily.      . Coenzyme Q10 (COQ10 PO) Take 1 tablet by  mouth daily.     . Echinacea-Vitamin C 250-250 MG CAPS Take 1 capsule by mouth daily.     . fluticasone (FLOVENT HFA) 110 MCG/ACT inhaler Inhale 2 puffs into the lungs 2 (two) times daily as needed (for wheezing & SOB).    . Magnesium 250 MG TABS Take 250 mg by mouth every morning.     . meloxicam (MOBIC) 7.5 MG tablet Take 1 tablet (7.5 mg total) by mouth daily. 90 tablet 1  . metoprolol (LOPRESSOR) 50 MG tablet Take 50 mg tablet by mouth daily in the am and 25 mg tablet by mouth daily in the pm.    . Multiple Vitamin (MULTIVITAMIN) tablet Take 1 tablet by mouth daily.      . vitamin B-12 (CYANOCOBALAMIN) 100 MCG tablet Take 100 mcg by mouth daily.     No current facility-administered medications for this visit.    Allergies  Celebrex  Electrocardiogram:   06/11/15 SR rate 80 RBBB ? Limb lead reversal   Assessment and Plan SVT stable on beta blocker no need to entertain ablation at this point in time Pulm: still with left effusion on exam  F/u CT in September  Anxiety: weight loss with sertraline f/u Dr Raliegh Ip for alternatives

## 2015-08-23 ENCOUNTER — Ambulatory Visit (INDEPENDENT_AMBULATORY_CARE_PROVIDER_SITE_OTHER): Payer: Medicare Other | Admitting: Cardiovascular Disease

## 2015-08-23 ENCOUNTER — Encounter: Payer: Self-pay | Admitting: Cardiovascular Disease

## 2015-08-23 VITALS — BP 96/44 | HR 96 | Ht 66.0 in | Wt 129.8 lb

## 2015-08-23 DIAGNOSIS — R002 Palpitations: Secondary | ICD-10-CM | POA: Diagnosis not present

## 2015-08-23 MED ORDER — METOPROLOL TARTRATE 50 MG PO TABS: ORAL_TABLET | ORAL | Status: DC

## 2015-08-23 NOTE — Patient Instructions (Signed)
Your physician wants you to follow-up in:  6 MONTHS WITH DR NISHAN  You will receive a reminder letter in the mail two months in advance. If you don't receive a letter, please call our office to schedule the follow-up appointment. Your physician recommends that you continue on your current medications as directed. Please refer to the Current Medication list given to you today. 

## 2015-09-05 ENCOUNTER — Ambulatory Visit: Payer: Medicare Other | Admitting: Adult Health

## 2015-09-19 ENCOUNTER — Ambulatory Visit: Payer: Medicare Other | Admitting: Internal Medicine

## 2015-09-27 ENCOUNTER — Encounter: Payer: Self-pay | Admitting: Internal Medicine

## 2015-09-27 ENCOUNTER — Ambulatory Visit (INDEPENDENT_AMBULATORY_CARE_PROVIDER_SITE_OTHER): Payer: Medicare Other | Admitting: *Deleted

## 2015-09-27 ENCOUNTER — Ambulatory Visit (INDEPENDENT_AMBULATORY_CARE_PROVIDER_SITE_OTHER): Payer: Medicare Other | Admitting: Internal Medicine

## 2015-09-27 ENCOUNTER — Ambulatory Visit (INDEPENDENT_AMBULATORY_CARE_PROVIDER_SITE_OTHER)
Admission: RE | Admit: 2015-09-27 | Discharge: 2015-09-27 | Disposition: A | Discharge status: Home / Self Care | Payer: Medicare Other | Source: Ambulatory Visit | Attending: Adult Health | Admitting: Adult Health

## 2015-09-27 VITALS — BP 122/68 | HR 64 | Ht 66.0 in | Wt 126.4 lb

## 2015-09-27 DIAGNOSIS — R911 Solitary pulmonary nodule: Secondary | ICD-10-CM

## 2015-09-27 DIAGNOSIS — J90 Pleural effusion, not elsewhere classified: Secondary | ICD-10-CM

## 2015-09-27 DIAGNOSIS — Z23 Encounter for immunization: Secondary | ICD-10-CM

## 2015-09-27 NOTE — Progress Notes (Signed)
Subjective:    Patient ID: [name], female    DOB: 11/20/1936, 79 y.o.   MRN: [id number]  HPI   OV 09/27/2015  Chief Complaint  Patient presents with  . Follow-up    Pt states that she is doing well. Pt states that she does not use her inhalers on a regular basis. Pt denies any cough/wheeze/CP/tightness. Pt states that she does sometimes have to clear her throat. Pt denies any nighttime cough. No other complaints/concerns.    79 year old female that I saw on June 2016 for a loculated left-sided pleural effusion with gas. She deferred VATS thoracoscopy (drainage via chest tube. She opted for antibody treatment with 3 weeks with Augmentin. She followed up in the interim with my nurse practitioner chest x-ray showed improvement. Today she says that she feels well. She is asymptomatic. Her left-sided chest tightness is resolved. She had a chest CT that shows resolution of the pleural effusion completely and also the nodule. I personally visualized this film. Off note she did have autoimmune workup that was negative.  Ct Chest Wo Contrast  09/27/2015   CLINICAL DATA:  Three month follow-up. LEFT pleural effusion and lung nodule.  EXAM: CT CHEST WITHOUT CONTRAST  TECHNIQUE: Multidetector CT imaging of the chest was performed following the standard protocol without IV contrast.  COMPARISON:  CT 06/21/2015  FINDINGS: Mediastinum/Nodes: No axillary supraclavicular adenopathy. No mediastinal hilar adenopathy. No pericardial fluid. Esophagus normal.  Lungs/Pleura: There is marked improvement in the loculated fluid collections in the LEFT lower lobe. Only minimal pleural thickening remains at the LEFT lateral lung base along the oblique fissure.  The nodule of concern in the superior segment of LEFT lower lobe has near completely resolved with only mild ill-defined parenchymal thickening at this level (image 24, series 3) where previously there was defined 13 mm nodule.  There small nodule along the  RIGHT oblique fissure measuring 3 mm on image 36, series 3 which is unchanged from prior has benign morphology  Upper abdomen: Limited view of the liver, kidneys, pancreas are unremarkable. Normal adrenal glands.  Musculoskeletal: No aggressive osseous lesion.  IMPRESSION: 1. Interval resolution of the inflammatory or infectious nodule in superior segment of the LEFT lower lobe. 2. Near complete resolution of the loculated pleural fluid collection in the lower LEFT hemi thorax.   Electronically Signed   By: Suzy Bouchard M.D.   On: 09/27/2015 09:14      has a past medical history of VITAMIN B12 DEFICIENCY (09/02/2007); HYPERLIPIDEMIA (12/09/2007); ESSENTIAL HYPERTENSION (10/29/2010); RBBB (09/05/2010); VENTRICULAR HYPERTROPHY, LEFT (08/12/2010); GERD (09/02/2007); DIVERTICULAR DISEASE (09/03/2010); CHOLELITHIASIS (09/03/2010); GALLSTONE PANCREATITIS (09/03/2010); ARTHRITIS (09/03/2010); OSTEOPOROSIS (05/17/2009); COLONIC POLYPS, HX OF (06/02/2008); HEART MURMUR, HX OF (08/12/2010); DYSPHAGIA UNSPECIFIED (09/03/2010); Palpitations (09/03/2010); WEIGHT LOSS, ABNORMAL (06/02/2008); Hiatal hernia; Hematuria; Polyuria; Insomnia; Paresthesia; Lichen simplex chronicus; Anxiety state; Low back pain; Fibromyalgia; Osteopenia; Asthma; and colonoscopy.   reports that she has never smoked. She has never used smokeless tobacco.  Past Surgical History  Procedure Laterality Date  . Cholecystectomy    . Appendectomy    . Spine surgery      cervical fusion  . Abdominal hysterectomy      THA and O  . Esophagogastroduodenoscopy      with HH and esophagitis  . Oophorectomy      Allergies  Allergen Reactions  . Celebrex [Celecoxib] Rash    REACTION: rash    Immunization History  Administered Date(s) Administered  . Influenza Split 10/01/2011, 10/11/2012  . Influenza  Whole 09/30/2008, 09/27/2009, 09/25/2010  . Influenza,inj,Quad PF,36+ Mos 09/14/2013, 09/27/2014, 10/16/2014  . Pneumococcal Conjugate-13 03/21/2015  .  Pneumococcal Polysaccharide-23 08/16/2014    Family History  Problem Relation Age of Onset  . Heart disease Mother   . Stroke Mother   . Prostate cancer Father      Current outpatient prescriptions:  .  ALPRAZolam (XANAX) 1 MG tablet, Take 1 tablet (1 mg total) by mouth 2 (two) times daily., Disp: 60 tablet, Rfl: 2 .  aspirin 81 MG tablet, Take 81 mg by mouth daily., Disp: , Rfl:  .  Calcium Carbonate-Vitamin D (CALTRATE 600+D) 600-400 MG-UNIT per chew tablet, Chew 1 tablet by mouth 2 (two) times daily.  , Disp: , Rfl:  .  Coenzyme Q10 (COQ10 PO), Take 1 tablet by mouth daily. , Disp: , Rfl:  .  Echinacea-Vitamin C 250-250 MG CAPS, Take 1 capsule by mouth daily. , Disp: , Rfl:  .  fluticasone (FLOVENT HFA) 110 MCG/ACT inhaler, Inhale 2 puffs into the lungs 2 (two) times daily as needed (for wheezing & SOB). PATIENT STATES USES PRN, Disp: , Rfl:  .  Magnesium 250 MG TABS, Take 250 mg by mouth every morning. , Disp: , Rfl:  .  meloxicam (MOBIC) 7.5 MG tablet, Take 1 tablet (7.5 mg total) by mouth daily., Disp: 90 tablet, Rfl: 1 .  metoprolol (LOPRESSOR) 50 MG tablet, Take 50 mg tablet by mouth daily in the am and 25 mg tablet by mouth daily in the pm., Disp: 45 tablet, Rfl: 11 .  Multiple Vitamin (MULTIVITAMIN) tablet, Take 1 tablet by mouth daily.  , Disp: , Rfl:  .  vitamin B-12 (CYANOCOBALAMIN) 100 MCG tablet, Take 100 mcg by mouth daily., Disp: , Rfl:       Review of Systems     Objective:   Physical Exam  Constitutional: She is oriented to person, place, and time. She appears well-developed and well-nourished. No distress.  HENT:  Head: Normocephalic and atraumatic.  Right Ear: External ear normal.  Left Ear: External ear normal.  Mouth/Throat: Oropharynx is clear and moist. No oropharyngeal exudate.  Eyes: Conjunctivae and EOM are normal. Pupils are equal, round, and reactive to light. Right eye exhibits no discharge. Left eye exhibits no discharge. No scleral icterus.    Neck: Normal range of motion. Neck supple. No JVD present. No tracheal deviation present. No thyromegaly present.  Cardiovascular: Normal rate, regular rhythm, normal heart sounds and intact distal pulses.  Exam reveals no gallop and no friction rub.   No murmur heard. Pulmonary/Chest: Effort normal and breath sounds normal. No respiratory distress. She has no wheezes. She has no rales. She exhibits no tenderness.  Abdominal: Soft. Bowel sounds are normal. She exhibits no distension and no mass. There is no tenderness. There is no rebound and no guarding.  Musculoskeletal: Normal range of motion. She exhibits no edema or tenderness.  Lymphadenopathy:    She has no cervical adenopathy.  Neurological: She is alert and oriented to person, place, and time. She has normal reflexes. No cranial nerve deficit. She exhibits normal muscle tone. Coordination normal.  Skin: Skin is warm and dry. No rash noted. She is not diaphoretic. No erythema. No pallor.  Psychiatric: She has a normal mood and affect. Her behavior is normal. Judgment and thought content normal.  Vitals reviewed.   Filed Vitals:   09/27/15 1004  BP: 122/68  Pulse: 64  Height: 5\' 6"  (1.676 m)  Weight: 126 lb 6.4 oz (57.335  kg)  SpO2: 99%          Assessment & Plan:     ICD-9-CM ICD-10-CM   1. Loculated pleural effusion 511.9 J90   2. Lung nodule 793.11 R91.1     All resolved  PLAN FLu shot with PCP Nyoka Cowden, MD asap No further active folloowup in pulmonary but return as and when needed  Dr. Brand Males, M.D., [facility].C.P Pulmonary and Critical Care Medicine Staff Physician [facility] Pager: [phone], If no answer or between  15:00h - 7:00h: call [phone]  09/27/2015 10:19 AM

## 2015-09-27 NOTE — Patient Instructions (Addendum)
ICD-9-CM ICD-10-CM   1. Loculated pleural effusion 511.9 J90   2. Lung nodule 793.11 R91.1    All resolved FLu shot with PCP Nyoka Cowden, MD asap No further active folloowup in pulmonary but return as and when needed

## 2015-10-17 ENCOUNTER — Encounter: Payer: Self-pay | Admitting: Internal Medicine

## 2015-10-17 ENCOUNTER — Ambulatory Visit (INDEPENDENT_AMBULATORY_CARE_PROVIDER_SITE_OTHER): Payer: Medicare Other | Admitting: Internal Medicine

## 2015-10-17 VITALS — BP 118/60 | HR 87 | Temp 97.6°F | Resp 20 | Ht 66.0 in | Wt 128.0 lb

## 2015-10-17 DIAGNOSIS — I10 Essential (primary) hypertension: Secondary | ICD-10-CM

## 2015-10-17 DIAGNOSIS — E1159 Type 2 diabetes mellitus with other circulatory complications: Secondary | ICD-10-CM

## 2015-10-17 DIAGNOSIS — R911 Solitary pulmonary nodule: Secondary | ICD-10-CM

## 2015-10-17 DIAGNOSIS — G4700 Insomnia, unspecified: Secondary | ICD-10-CM

## 2015-10-17 DIAGNOSIS — E1165 Type 2 diabetes mellitus with hyperglycemia: Secondary | ICD-10-CM

## 2015-10-17 DIAGNOSIS — IMO0002 Reserved for concepts with insufficient information to code with codable children: Secondary | ICD-10-CM

## 2015-10-17 MED ORDER — ALPRAZOLAM 1 MG PO TABS: 1.0000 mg | ORAL_TABLET | Freq: Two times a day (BID) | ORAL | Status: DC

## 2015-10-17 NOTE — Patient Instructions (Signed)
Limit your sodium (Salt) intake   Please check your hemoglobin A1c every 6 months    It is important that you exercise regularly, at least 20 minutes 3 to 4 times per week.  If you develop chest pain or shortness of breath seek  medical attention.    

## 2015-10-17 NOTE — Progress Notes (Signed)
Pre visit review using our clinic review tool, if applicable. No additional management support is needed unless otherwise documented below in the visit note. 

## 2015-10-17 NOTE — Progress Notes (Signed)
Subjective:    Patient ID: [name], female    DOB: 1936-01-21, 79 y.o.   MRN: [id number]  HPI  Lab Results  Component Value Date   HGBA1C 6.3 07/12/2015    79 year old patient who is seen today for follow-up of type 2 diabetes.  She has been seen and released from pulmonary medicine due to a pulmonary nodule as well as a loculated pleural effusion that have both resolved.  Patient generally feels well today. She has essential hypertension which has been stable. No new concerns or complaints. She does have a history of anxiety disorder and does use alprazolam for anxiety and also to assist with sleep.  Wt Readings from Last 3 Encounters:  10/17/15 128 lb (58.06 kg)  09/27/15 126 lb 6.4 oz (57.335 kg)  08/23/15 129 lb 12.8 oz (58.877 kg)    Past Medical History  Diagnosis Date  . VITAMIN B12 DEFICIENCY 09/02/2007  . HYPERLIPIDEMIA 12/09/2007  . ESSENTIAL HYPERTENSION 10/29/2010  . RBBB 09/05/2010  . VENTRICULAR HYPERTROPHY, LEFT 08/12/2010  . GERD 09/02/2007  . DIVERTICULAR DISEASE 09/03/2010  . CHOLELITHIASIS 09/03/2010  . GALLSTONE PANCREATITIS 09/03/2010  . ARTHRITIS 09/03/2010  . OSTEOPOROSIS 05/17/2009  . COLONIC POLYPS, HX OF 06/02/2008  . HEART MURMUR, HX OF 08/12/2010  . DYSPHAGIA UNSPECIFIED 09/03/2010  . Palpitations 09/03/2010    event monitor reveals long RP tachycardia, likely sinus tachycardia  . WEIGHT LOSS, ABNORMAL 06/02/2008  . Hiatal hernia   . Hematuria   . Polyuria   . Insomnia   . Paresthesia   . Lichen simplex chronicus   . Anxiety state     unspecified  . Low back pain     chronic  . Fibromyalgia   . Osteopenia   . Asthma   . Hx of colonoscopy     2014    Social History   Social History  . Marital Status: Widowed    Spouse Name: N/A  . Number of Children: 6  . Years of Education: N/A   Occupational History  . retired    Social History Main Topics  . Smoking status: Never Smoker   . Smokeless tobacco: Never Used  . Alcohol Use: No  . Drug  Use: No  . Sexual Activity: No   Other Topics Concern  . Not on file   Social History Narrative   Widowed   2 children   Lives in [place]   Retired from [employer]    Past Surgical History  Procedure Laterality Date  . Cholecystectomy    . Appendectomy    . Spine surgery      cervical fusion  . Abdominal hysterectomy      THA and O  . Esophagogastroduodenoscopy      with HH and esophagitis  . Oophorectomy      Family History  Problem Relation Age of Onset  . Heart disease Mother   . Stroke Mother   . Prostate cancer Father     Allergies  Allergen Reactions  . Celebrex [Celecoxib] Rash    REACTION: rash    Current Outpatient Prescriptions on File Prior to Visit  Medication Sig Dispense Refill  . aspirin 81 MG tablet Take 81 mg by mouth daily.    . Calcium Carbonate-Vitamin D (CALTRATE 600+D) 600-400 MG-UNIT per chew tablet Chew 1 tablet by mouth 2 (two) times daily.      . Coenzyme Q10 (COQ10 PO) Take 1 tablet by mouth daily.     . Echinacea-Vitamin  C 250-250 MG CAPS Take 1 capsule by mouth daily.     . fluticasone (FLOVENT HFA) 110 MCG/ACT inhaler Inhale 2 puffs into the lungs 2 (two) times daily as needed (for wheezing & SOB). PATIENT STATES USES PRN    . Magnesium 250 MG TABS Take 250 mg by mouth every morning.     . meloxicam (MOBIC) 7.5 MG tablet Take 1 tablet (7.5 mg total) by mouth daily. 90 tablet 1  . metoprolol (LOPRESSOR) 50 MG tablet Take 50 mg tablet by mouth daily in the am and 25 mg tablet by mouth daily in the pm. 45 tablet 11  . Multiple Vitamin (MULTIVITAMIN) tablet Take 1 tablet by mouth daily.      . vitamin B-12 (CYANOCOBALAMIN) 100 MCG tablet Take 100 mcg by mouth daily.     No current facility-administered medications on file prior to visit.    BP 118/60 mmHg  Pulse 87  Temp(Src) 97.6 F (36.4 C) (Oral)  Resp 20  Ht 5\' 6"  (1.676 m)  Wt 128 lb (58.06 kg)  BMI 20.67 kg/m2  SpO2 98%     Review of Systems  Constitutional:  Negative.   HENT: Negative for congestion, dental problem, hearing loss, rhinorrhea, sinus pressure, sore throat and tinnitus.   Eyes: Negative for pain, discharge and visual disturbance.  Respiratory: Negative for cough and shortness of breath.   Cardiovascular: Negative for chest pain, palpitations and leg swelling.  Gastrointestinal: Negative for nausea, vomiting, abdominal pain, diarrhea, constipation, blood in stool and abdominal distention.  Genitourinary: Negative for dysuria, urgency, frequency, hematuria, flank pain, vaginal bleeding, vaginal discharge, difficulty urinating, vaginal pain and pelvic pain.  Musculoskeletal: Negative for joint swelling, arthralgias and gait problem.  Skin: Negative for rash.  Neurological: Negative for dizziness, syncope, speech difficulty, weakness, numbness and headaches.  Hematological: Negative for adenopathy.  Psychiatric/Behavioral: Positive for sleep disturbance. Negative for behavioral problems, dysphoric mood and agitation. The patient is nervous/anxious.        Objective:   Physical Exam  Constitutional: She is oriented to person, place, and time. She appears well-developed and well-nourished.  HENT:  Head: Normocephalic.  Right Ear: External ear normal.  Left Ear: External ear normal.  Mouth/Throat: Oropharynx is clear and moist.  Eyes: Conjunctivae and EOM are normal. Pupils are equal, round, and reactive to light.  Neck: Normal range of motion. Neck supple. No thyromegaly present.  Cardiovascular: Normal rate, regular rhythm, normal heart sounds and intact distal pulses.   Pulmonary/Chest: Effort normal and breath sounds normal.  Abdominal: Soft. Bowel sounds are normal. She exhibits no mass. There is no tenderness.  Musculoskeletal: Normal range of motion.  Lymphadenopathy:    She has no cervical adenopathy.  Neurological: She is alert and oriented to person, place, and time.  Skin: Skin is warm and dry. No rash noted.    Psychiatric: She has a normal mood and affect. Her behavior is normal.          Assessment & Plan:   Diabetes mellitus.  Appears to be well controlled.  We'll check a hemoglobin A1c and reassess in 6 months Essential hypertension.  Blood pressure low normal History weight loss.  Stable Pulmonary nodule, resolved Generalized anxiety disorder/insomnia.  Alprazolam refilled  Preventive health.  Patient has received annual flu vaccine CPX 6 months

## 2015-10-18 ENCOUNTER — Ambulatory Visit: Payer: Medicare Other | Admitting: Internal Medicine

## 2015-11-16 ENCOUNTER — Telehealth: Payer: Self-pay | Admitting: Internal Medicine

## 2015-11-16 NOTE — Telephone Encounter (Signed)
Dr. Yong Channel, can you please provide a letter for pt's insurance saying that she is not Diabetic. Need letter before the 24 th. Pt is not diabetic she is glucose intolerance. Please review chart. Thank you.

## 2015-11-16 NOTE — Telephone Encounter (Signed)
I thought I could write this for you and thought it was erroneously put in her problem list. Dr. Raliegh Ip did document in her assessment and plan that she had diabetes in last note though. She had a random sugar >250 once and unclear if she was symptomatic- if she was that is enough to diagnose diabetes. There are other sugars over 125 and not clear if those are fasting. Both a1c's do not show diabetes clearly at 6.3 but I do not feel that I can overturn that documentation- I believe it needs to come from him.

## 2015-11-19 NOTE — Telephone Encounter (Signed)
Pt called back, told her unfortunately Dr. Yong Channel is unable to write the letter due to diagnosis of diabetes in his last note. Told pt I will talk to Dr.K when he comes back, he may come in Wed for a little if not it will be Monday the 28th till I see him. Pt verbalized understanding.

## 2015-11-19 NOTE — Telephone Encounter (Signed)
Left message on voicemail to call office.  

## 2015-11-26 ENCOUNTER — Encounter: Payer: Self-pay | Admitting: Internal Medicine

## 2015-11-26 DIAGNOSIS — R7302 Impaired glucose tolerance (oral): Secondary | ICD-10-CM | POA: Insufficient documentation

## 2015-11-26 NOTE — Telephone Encounter (Signed)
Spoke to pt, told her letter is ready for pickup will be at the front desk. Pt verbalized understanding.

## 2015-11-26 NOTE — Telephone Encounter (Signed)
Please see message, pt needs letter for insurance.

## 2015-12-03 DIAGNOSIS — H25013 Cortical age-related cataract, bilateral: Secondary | ICD-10-CM | POA: Diagnosis not present

## 2015-12-03 DIAGNOSIS — H2513 Age-related nuclear cataract, bilateral: Secondary | ICD-10-CM | POA: Diagnosis not present

## 2016-01-30 ENCOUNTER — Other Ambulatory Visit: Payer: Self-pay | Admitting: Surgery

## 2016-01-30 DIAGNOSIS — E042 Nontoxic multinodular goiter: Secondary | ICD-10-CM

## 2016-01-31 ENCOUNTER — Other Ambulatory Visit: Payer: Self-pay | Admitting: Internal Medicine

## 2016-02-04 DIAGNOSIS — E042 Nontoxic multinodular goiter: Secondary | ICD-10-CM | POA: Diagnosis not present

## 2016-02-05 ENCOUNTER — Ambulatory Visit
Admission: RE | Admit: 2016-02-05 | Discharge: 2016-02-05 | Disposition: A | Discharge status: Home / Self Care | Payer: Medicare Other | Source: Ambulatory Visit | Attending: Surgery | Admitting: Surgery

## 2016-02-05 DIAGNOSIS — E042 Nontoxic multinodular goiter: Secondary | ICD-10-CM

## 2016-02-15 ENCOUNTER — Ambulatory Visit (INDEPENDENT_AMBULATORY_CARE_PROVIDER_SITE_OTHER): Payer: Medicare Other | Admitting: Internal Medicine

## 2016-02-15 ENCOUNTER — Encounter: Payer: Self-pay | Admitting: Internal Medicine

## 2016-02-15 VITALS — BP 110/60 | HR 96 | Temp 98.5°F | Resp 20 | Ht 66.0 in | Wt 122.0 lb

## 2016-02-15 DIAGNOSIS — B9789 Other viral agents as the cause of diseases classified elsewhere: Secondary | ICD-10-CM

## 2016-02-15 DIAGNOSIS — R7302 Impaired glucose tolerance (oral): Secondary | ICD-10-CM | POA: Diagnosis not present

## 2016-02-15 DIAGNOSIS — I10 Essential (primary) hypertension: Secondary | ICD-10-CM | POA: Diagnosis not present

## 2016-02-15 DIAGNOSIS — J069 Acute upper respiratory infection, unspecified: Secondary | ICD-10-CM

## 2016-02-15 DIAGNOSIS — J4520 Mild intermittent asthma, uncomplicated: Secondary | ICD-10-CM

## 2016-02-15 NOTE — Progress Notes (Signed)
Subjective:    Patient ID: [name], female    DOB: [birth year], 80 y.o.   MRN: [id number]  HPI  80 year old patient who presents with a three-day history of head and chest congestion and mild nonproductive cough.  No fever, wheezing or shortness of breath.  The patient does have a history of mild asthma.  She also has a history of impaired glucose tolerance.  She is scheduled for a annual exam in the spring.  Past Medical History  Diagnosis Date  . VITAMIN B12 DEFICIENCY 09/02/2007  . HYPERLIPIDEMIA 12/09/2007  . ESSENTIAL HYPERTENSION 10/29/2010  . RBBB 09/05/2010  . VENTRICULAR HYPERTROPHY, LEFT 08/12/2010  . GERD 09/02/2007  . DIVERTICULAR DISEASE 09/03/2010  . CHOLELITHIASIS 09/03/2010  . GALLSTONE PANCREATITIS 09/03/2010  . ARTHRITIS 09/03/2010  . OSTEOPOROSIS 05/17/2009  . COLONIC POLYPS, HX OF 06/02/2008  . HEART MURMUR, HX OF 08/12/2010  . DYSPHAGIA UNSPECIFIED 09/03/2010  . Palpitations 09/03/2010    event monitor reveals long RP tachycardia, likely sinus tachycardia  . WEIGHT LOSS, ABNORMAL 06/02/2008  . Hiatal hernia   . Hematuria   . Polyuria   . Insomnia   . Paresthesia   . Lichen simplex chronicus   . Anxiety state     unspecified  . Low back pain     chronic  . Fibromyalgia   . Osteopenia   . Asthma   . Hx of colonoscopy     2014    Social History   Social History  . Marital Status: Widowed    Spouse Name: N/A  . Number of Children: 6  . Years of Education: N/A   Occupational History  . retired    Social History Main Topics  . Smoking status: Never Smoker   . Smokeless tobacco: Never Used  . Alcohol Use: No  . Drug Use: No  . Sexual Activity: No   Other Topics Concern  . Not on file   Social History Narrative   Widowed   2 children   Lives in [place]   Retired from [employer]    Past Surgical History  Procedure Laterality Date  . Cholecystectomy    . Appendectomy    . Spine surgery      cervical fusion  . Abdominal hysterectomy      THA and  O  . Esophagogastroduodenoscopy      with HH and esophagitis  . Oophorectomy      Family History  Problem Relation Age of Onset  . Heart disease Mother   . Stroke Mother   . Prostate cancer Father     Allergies  Allergen Reactions  . Celebrex [Celecoxib] Rash    REACTION: rash    Current Outpatient Prescriptions on File Prior to Visit  Medication Sig Dispense Refill  . ALPRAZolam (XANAX) 1 MG tablet TAKE ONE TABLET BY MOUTH TWICE DAILY 60 tablet 2  . aspirin 81 MG tablet Take 81 mg by mouth daily.    . Calcium Carbonate-Vitamin D (CALTRATE 600+D) 600-400 MG-UNIT per chew tablet Chew 1 tablet by mouth 2 (two) times daily.      . Coenzyme Q10 (COQ10 PO) Take 1 tablet by mouth daily.     . Echinacea-Vitamin C 250-250 MG CAPS Take 1 capsule by mouth daily.     . fluticasone (FLOVENT HFA) 110 MCG/ACT inhaler Inhale 2 puffs into the lungs 2 (two) times daily as needed (for wheezing & SOB). PATIENT STATES USES PRN    . Magnesium 250 MG TABS  Take 250 mg by mouth every morning.     . meloxicam (MOBIC) 7.5 MG tablet Take 1 tablet (7.5 mg total) by mouth daily. 90 tablet 1  . metoprolol (LOPRESSOR) 50 MG tablet Take 50 mg tablet by mouth daily in the am and 25 mg tablet by mouth daily in the pm. 45 tablet 11  . Multiple Vitamin (MULTIVITAMIN) tablet Take 1 tablet by mouth daily.      . vitamin B-12 (CYANOCOBALAMIN) 100 MCG tablet Take 100 mcg by mouth daily.     No current facility-administered medications on file prior to visit.    BP 110/60 mmHg  Pulse 96  Temp(Src) 98.5 F (36.9 C) (Oral)  Resp 20  Ht 5\' 6"  (1.676 m)  Wt 122 lb (55.339 kg)  BMI 19.70 kg/m2  SpO2 99%     Review of Systems  Constitutional: Positive for activity change and appetite change.  HENT: Positive for congestion and postnasal drip. Negative for dental problem, hearing loss, rhinorrhea, sinus pressure, sore throat and tinnitus.   Eyes: Negative for pain, discharge and visual disturbance.  Respiratory:  Negative for cough and shortness of breath.   Cardiovascular: Negative for chest pain, palpitations and leg swelling.  Gastrointestinal: Negative for nausea, vomiting, abdominal pain, diarrhea, constipation, blood in stool and abdominal distention.  Genitourinary: Negative for dysuria, urgency, frequency, hematuria, flank pain, vaginal bleeding, vaginal discharge, difficulty urinating, vaginal pain and pelvic pain.  Musculoskeletal: Negative for joint swelling, arthralgias and gait problem.  Skin: Negative for rash.  Neurological: Negative for dizziness, syncope, speech difficulty, weakness, numbness and headaches.  Hematological: Negative for adenopathy.  Psychiatric/Behavioral: Negative for behavioral problems, dysphoric mood and agitation. The patient is not nervous/anxious.        Objective:   Physical Exam  Constitutional: She is oriented to person, place, and time. She appears well-developed and well-nourished.  HENT:  Head: Normocephalic.  Right Ear: External ear normal.  Left Ear: External ear normal.  Mouth/Throat: Oropharynx is clear and moist.  Eyes: Conjunctivae and EOM are normal. Pupils are equal, round, and reactive to light.  Neck: Normal range of motion. Neck supple. No thyromegaly present.  Cardiovascular: Normal rate, regular rhythm, normal heart sounds and intact distal pulses.   Pulmonary/Chest: Effort normal and breath sounds normal. No respiratory distress. She has no wheezes. She has no rales.  Abdominal: Soft. Bowel sounds are normal. She exhibits no mass. There is no tenderness.  Musculoskeletal: Normal range of motion.  Lymphadenopathy:    She has no cervical adenopathy.  Neurological: She is alert and oriented to person, place, and time.  Skin: Skin is warm and dry. No rash noted.  Psychiatric: She has a normal mood and affect. Her behavior is normal.          Assessment & Plan:   Viral URI with cough.  Will treat symptomatically History of asthma,  stable Impaired glucose tolerance.  We'll check hemoglobin A1c in the spring at the time of her annual exam  Hypertension, well-controlled

## 2016-02-15 NOTE — Patient Instructions (Signed)
Acute bronchitis symptoms for less than 10 days are generally not helped by antibiotics.  Take over-the-counter expectorants and cough medications such as  Mucinex DM.  Call if there is no improvement in 5 to 7 days or if  you develop worsening cough, fever, or new symptoms, such as shortness of breath or chest pain.  Annual exam as scheduled 

## 2016-02-15 NOTE — Progress Notes (Signed)
Pre visit review using our clinic review tool, if applicable. No additional management support is needed unless otherwise documented below in the visit note. 

## 2016-02-19 NOTE — Progress Notes (Signed)
Patient ID: HEAHTER [name], female   DOB: 11-Jan-1936, 80 y.o.   MRN: [id number] [name] is seen today for f/U of palpitations She has had daily palptations when initially seen 2011 . No previous heart history. ? History of murmur. Indicates they are not related to exertion. Made worse by caffeine. ECG 07/1511 [facility] NSR with RBBB. She feels more jittery in general and may be having an anxiety state. Reviewed lab work form 8/15 and TSH was normal. No SSCP dyspnea or syncope. Palpitatons can last minutes and occure daily.  Monitor subsequently documented long R-P tachycardia and patient seen by Dr Rayann Heman recently. Recommended continued beta blocker Rx  No significant palpitations  Taking lopressor bid once in am   Seeing Pulmonary for loculated left pleural effusion and 44mm lung nodule.  F/U CT September 2016 improved after 3 weeks Augmentin Rx  IMPRESSION: 1. Interval resolution of the inflammatory or infectious nodule in superior segment of the LEFT lower lobe. 2. Near complete resolution of the loculated pleural fluid collection in the lower LEFT hemi thorax.  BP a bit low and she complains of dizziness occasionally  Complains about weight loss despite good appetite  Widowed for 16 years but worries about her kids/grand kids     ROS: Denies fever, malais, weight loss, blurry vision, decreased visual acuity, cough, sputum, SOB, hemoptysis, pleuritic pain, palpitaitons, heartburn, abdominal pain, melena, lower extremity edema, claudication, or rash.  All other systems reviewed and negative  General: Anxious  Black female  Looks younger than stated age  55: normal Neck supple with no adenopathy JVP normal no bruits no thyromegaly Lungs clear with no wheezing and good diaphragmatic motion Heart:  S1/S2 SEM  murmur, no rub, gallop or click PMI normal Abdomen: benighn, BS positve, no tenderness, no AAA no bruit.  No HSM or HJR Distal pulses intact with no bruits No  edema Neuro non-focal Skin warm and dry No muscular weakness   Current Outpatient Prescriptions  Medication Sig Dispense Refill  . ALPRAZolam (XANAX) 1 MG tablet TAKE ONE TABLET BY MOUTH TWICE DAILY 60 tablet 2  . aspirin 81 MG tablet Take 81 mg by mouth daily.    . Calcium Carbonate-Vitamin D (CALTRATE 600+D) 600-400 MG-UNIT per chew tablet Chew 1 tablet by mouth 2 (two) times daily.      . Coenzyme Q10 (COQ10 PO) Take 1 tablet by mouth daily.     . Cyanocobalamin (VITAMIN B 12 PO) Take 100 mcg by mouth daily.    . Echinacea-Vitamin C 250-250 MG CAPS Take 1 capsule by mouth daily.     . fluticasone (FLOVENT HFA) 110 MCG/ACT inhaler Inhale 2 puffs into the lungs 2 (two) times daily as needed (for wheezing & SOB). PATIENT STATES USES PRN    . Magnesium 250 MG TABS Take 250 mg by mouth every morning.     . meloxicam (MOBIC) 7.5 MG tablet Take 1 tablet (7.5 mg total) by mouth daily. 90 tablet 1  . metoprolol (LOPRESSOR) 25 MG tablet Take 1 tablet (25 mg total) by mouth 2 (two) times daily. 60 tablet 11  . Multiple Vitamin (MULTIVITAMIN) tablet Take 1 tablet by mouth daily.      Marland Kitchen POTASSIUM CHLORIDE PO Take 99 mg by mouth daily.      No current facility-administered medications for this visit.    Allergies  Celebrex  Electrocardiogram:   06/11/15 SR rate 80 RBBB ? Limb lead reversal   Assessment and Plan SVT stable on beta  blocker no need to entertain ablation at this point in time Decrease lopresser to 1/2 tab bid due to lower BP Pulm: CT with improved inflammatory changes  Anxiety: weight loss persists despite being off sertraline  F/u primary no signs of CA continue xanax  [name]

## 2016-02-25 ENCOUNTER — Ambulatory Visit (INDEPENDENT_AMBULATORY_CARE_PROVIDER_SITE_OTHER): Payer: Medicare Other | Admitting: Cardiovascular Disease

## 2016-02-25 ENCOUNTER — Encounter: Payer: Self-pay | Admitting: Cardiovascular Disease

## 2016-02-25 VITALS — BP 94/50 | HR 69 | Ht 66.0 in | Wt 120.8 lb

## 2016-02-25 DIAGNOSIS — I4510 Unspecified right bundle-branch block: Secondary | ICD-10-CM | POA: Diagnosis not present

## 2016-02-25 MED ORDER — METOPROLOL TARTRATE 25 MG PO TABS: 25.0000 mg | ORAL_TABLET | Freq: Two times a day (BID) | ORAL | Status: DC

## 2016-02-25 NOTE — Patient Instructions (Addendum)
Medication Instructions:  Your physician has recommended you make the following change in your medication:  1-Decrease Metoprolol 25 mg by mouth twice daily  Labwork: NONE  Testing/Procedures: NONE  Follow-Up: Your physician wants you to follow-up in: 12 months with Dr. Johnsie Cancel. You will receive a reminder letter in the mail two months in advance. If you don't receive a letter, please call our office to schedule the follow-up appointment.  If you need a refill on your cardiac medications before your next appointment, please call your pharmacy.

## 2016-03-05 DIAGNOSIS — E042 Nontoxic multinodular goiter: Secondary | ICD-10-CM | POA: Diagnosis not present

## 2016-04-02 ENCOUNTER — Other Ambulatory Visit: Payer: Self-pay

## 2016-04-02 DIAGNOSIS — Z1231 Encounter for screening mammogram for malignant neoplasm of breast: Secondary | ICD-10-CM

## 2016-04-16 ENCOUNTER — Ambulatory Visit (INDEPENDENT_AMBULATORY_CARE_PROVIDER_SITE_OTHER): Payer: Medicare Other | Admitting: Internal Medicine

## 2016-04-16 ENCOUNTER — Encounter: Payer: Self-pay | Admitting: Internal Medicine

## 2016-04-16 VITALS — BP 102/56 | HR 76 | Temp 98.4°F | Resp 20 | Ht 66.0 in | Wt 120.0 lb

## 2016-04-16 DIAGNOSIS — I10 Essential (primary) hypertension: Secondary | ICD-10-CM

## 2016-04-16 DIAGNOSIS — R634 Abnormal weight loss: Secondary | ICD-10-CM | POA: Diagnosis not present

## 2016-04-16 DIAGNOSIS — R7302 Impaired glucose tolerance (oral): Secondary | ICD-10-CM

## 2016-04-16 DIAGNOSIS — E785 Hyperlipidemia, unspecified: Secondary | ICD-10-CM | POA: Diagnosis not present

## 2016-04-16 DIAGNOSIS — M810 Age-related osteoporosis without current pathological fracture: Secondary | ICD-10-CM | POA: Diagnosis not present

## 2016-04-16 DIAGNOSIS — Z86010 Personal history of colonic polyps: Secondary | ICD-10-CM | POA: Diagnosis not present

## 2016-04-16 DIAGNOSIS — R911 Solitary pulmonary nodule: Secondary | ICD-10-CM

## 2016-04-16 LAB — COMPREHENSIVE METABOLIC PANEL
ALK PHOS: 99 U/L (ref 39–117)
ALT: 31 U/L (ref 0–35)
AST: 24 U/L (ref 0–37)
Albumin: 3.7 g/dL (ref 3.5–5.2)
BUN: 12 mg/dL (ref 6–23)
CALCIUM: 9.3 mg/dL (ref 8.4–10.5)
CO2: 31 mEq/L (ref 19–32)
Chloride: 105 mEq/L (ref 96–112)
Creatinine, Ser: 0.65 mg/dL (ref 0.40–1.20)
GFR: 113.3 mL/min (ref >60.00)
Glucose, Bld: 102 mg/dL — ABNORMAL HIGH (ref 70–99)
Potassium: 3.6 mEq/L (ref 3.5–5.1)
Sodium: 140 mEq/L (ref 135–145)
TOTAL PROTEIN: 6.6 g/dL (ref 6.0–8.3)
Total Bilirubin: 0.9 mg/dL (ref 0.2–1.2)

## 2016-04-16 LAB — CBC WITH DIFFERENTIAL/PLATELET
Basophils Absolute: 0 10*3/uL (ref 0.0–0.1)
Basophils Relative: 0.4 % (ref 0.0–3.0)
EOS PCT: 5.1 % — AB (ref 0.0–5.0)
Eosinophils Absolute: 0.3 10*3/uL (ref 0.0–0.7)
HCT: 35.2 % — ABNORMAL LOW (ref 36.0–46.0)
HEMOGLOBIN: 11.9 g/dL — AB (ref 12.0–15.0)
Lymphocytes Relative: 28.6 % (ref 12.0–46.0)
Lymphs Abs: 1.5 10*3/uL (ref 0.7–4.0)
MCHC: 33.8 g/dL (ref 30.0–36.0)
MCV: 88.7 fl (ref 78.0–100.0)
MONO ABS: 0.3 10*3/uL (ref 0.1–1.0)
MONOS PCT: 6.8 % (ref 3.0–12.0)
Neutro Abs: 3 10*3/uL (ref 1.4–7.7)
Neutrophils Relative %: 59.1 % (ref 43.0–77.0)
Platelets: 235 10*3/uL (ref 150.0–400.0)
RBC: 3.97 Mil/uL (ref 3.87–5.11)
RDW: 14.4 % (ref 11.5–15.5)
WBC: 5.1 10*3/uL (ref 4.0–10.5)

## 2016-04-16 LAB — SEDIMENTATION RATE: Sed Rate: 13 mm/hr (ref 0–22)

## 2016-04-16 MED ORDER — ALPRAZOLAM 1 MG PO TABS: 1.0000 mg | ORAL_TABLET | Freq: Two times a day (BID) | ORAL | Status: DC

## 2016-04-16 NOTE — Progress Notes (Signed)
Subjective:    Patient ID: [name], female    DOB: 1936/05/15, 80 y.o.   MRN: [id number]  HPI  Wt Readings from Last 3 Encounters:  04/16/16 120 lb (54.432 kg)  02/25/16 120 lb 12.8 oz (54.795 kg)  02/15/16 122 lb (55.74 kg)    80 year old patient who is seen today for her six-month follow-up.  She states that she has been seen recently by endocrinology.  Pulmonary and cardiology.  She states that she has had recent thyroid function studies done. She generally feels well with a good appetite.  She is asking about measures to increase her weight.  Her weight has been stable for the past 2 months but is down 11 pounds since July of last year Laboratory studies included elevated sedimentation rate in July  No constitutional complaints other than the weight loss.  She generally feels well with good appetite.  No weakness or myalgias.  Past Medical History  Diagnosis Date  . VITAMIN B12 DEFICIENCY 09/02/2007  . HYPERLIPIDEMIA 12/09/2007  . ESSENTIAL HYPERTENSION 10/29/2010  . RBBB 09/05/2010  . VENTRICULAR HYPERTROPHY, LEFT 08/12/2010  . GERD 09/02/2007  . DIVERTICULAR DISEASE 09/03/2010  . CHOLELITHIASIS 09/03/2010  . GALLSTONE PANCREATITIS 09/03/2010  . ARTHRITIS 09/03/2010  . OSTEOPOROSIS 05/17/2009  . COLONIC POLYPS, HX OF 06/02/2008  . HEART MURMUR, HX OF 08/12/2010  . DYSPHAGIA UNSPECIFIED 09/03/2010  . Palpitations 09/03/2010    event monitor reveals long RP tachycardia, likely sinus tachycardia  . WEIGHT LOSS, ABNORMAL 06/02/2008  . Hiatal hernia   . Hematuria   . Polyuria   . Insomnia   . Paresthesia   . Lichen simplex chronicus   . Anxiety state     unspecified  . Low back pain     chronic  . Fibromyalgia   . Osteopenia   . Asthma   . Hx of colonoscopy     2014     Social History   Social History  . Marital Status: Widowed    Spouse Name: N/A  . Number of Children: 6  . Years of Education: N/A   Occupational History  . retired    Social History Main Topics  .  Smoking status: Never Smoker   . Smokeless tobacco: Never Used  . Alcohol Use: No  . Drug Use: No  . Sexual Activity: No   Other Topics Concern  . Not on file   Social History Narrative   Widowed   2 children   Lives in [place]   Retired from [employer]    Past Surgical History  Procedure Laterality Date  . Cholecystectomy    . Appendectomy    . Spine surgery      cervical fusion  . Abdominal hysterectomy      THA and O  . Esophagogastroduodenoscopy      with HH and esophagitis  . Oophorectomy      Family History  Problem Relation Age of Onset  . Heart disease Mother   . Stroke Mother   . Prostate cancer Father     Allergies  Allergen Reactions  . Celebrex [Celecoxib] Rash    REACTION: rash    Current Outpatient Prescriptions on File Prior to Visit  Medication Sig Dispense Refill  . aspirin 81 MG tablet Take 81 mg by mouth daily.    . Calcium Carbonate-Vitamin D (CALTRATE 600+D) 600-400 MG-UNIT per chew tablet Chew 1 tablet by mouth 2 (two) times daily.      . Coenzyme Q10 (  COQ10 PO) Take 1 tablet by mouth daily.     . Cyanocobalamin (VITAMIN B 12 PO) Take 100 mcg by mouth daily.    . Echinacea-Vitamin C 250-250 MG CAPS Take 1 capsule by mouth daily.     . fluticasone (FLOVENT HFA) 110 MCG/ACT inhaler Inhale 2 puffs into the lungs 2 (two) times daily as needed (for wheezing & SOB). PATIENT STATES USES PRN    . Magnesium 250 MG TABS Take 250 mg by mouth every morning.     . meloxicam (MOBIC) 7.5 MG tablet Take 1 tablet (7.5 mg total) by mouth daily. 90 tablet 1  . metoprolol (LOPRESSOR) 25 MG tablet Take 1 tablet (25 mg total) by mouth 2 (two) times daily. 60 tablet 11  . Multiple Vitamin (MULTIVITAMIN) tablet Take 1 tablet by mouth daily.      Marland Kitchen POTASSIUM CHLORIDE PO Take 99 mg by mouth daily.      No current facility-administered medications on file prior to visit.    BP 102/56 mmHg  Pulse 76  Temp(Src) 98.4 F (36.9 C) (Oral)  Resp 20  Ht 5\' 6"   (1.676 m)  Wt 120 lb (54.432 kg)  BMI 19.38 kg/m2     Review of Systems  Constitutional: Positive for unexpected weight change.  HENT: Negative for congestion, dental problem, hearing loss, rhinorrhea, sinus pressure, sore throat and tinnitus.   Eyes: Negative for pain, discharge and visual disturbance.  Respiratory: Negative for cough and shortness of breath.   Cardiovascular: Negative for chest pain, palpitations and leg swelling.  Gastrointestinal: Negative for nausea, vomiting, abdominal pain, diarrhea, constipation, blood in stool and abdominal distention.  Genitourinary: Negative for dysuria, urgency, frequency, hematuria, flank pain, vaginal bleeding, vaginal discharge, difficulty urinating, vaginal pain and pelvic pain.  Musculoskeletal: Negative for joint swelling, arthralgias and gait problem.  Skin: Negative for rash.  Neurological: Negative for dizziness, syncope, speech difficulty, weakness, numbness and headaches.  Hematological: Negative for adenopathy.  Psychiatric/Behavioral: Negative for behavioral problems, dysphoric mood and agitation. The patient is not nervous/anxious.        Objective:   Physical Exam  Constitutional: She is oriented to person, place, and time. She appears well-developed and well-nourished.  Blood pressure low normal  HENT:  Head: Normocephalic.  Right Ear: External ear normal.  Left Ear: External ear normal.  Mouth/Throat: Oropharynx is clear and moist.  Eyes: Conjunctivae and EOM are normal. Pupils are equal, round, and reactive to light.  Neck: Normal range of motion. Neck supple. No thyromegaly present.  Cardiovascular: Normal rate, regular rhythm, normal heart sounds and intact distal pulses.   Pulmonary/Chest: Effort normal and breath sounds normal.  Abdominal: Soft. Bowel sounds are normal. She exhibits no mass. There is no tenderness.  Musculoskeletal: Normal range of motion.  Some arthritic changes involving the small joints of  the hands with some suggestion of ulnar deviation  Lymphadenopathy:    She has no cervical adenopathy.  Neurological: She is alert and oriented to person, place, and time.  Skin: Skin is warm and dry. No rash noted.  Psychiatric: She has a normal mood and affect. Her behavior is normal.          Assessment & Plan:   History of weight loss.  We'll recheck sedimentation rate and screening lab Hypertension, well-controlled History of pulmonary nodule, resolved History of thyroid nodule.  Recent endocrine follow-up with normal thyroid function studies  Patient will report any additional weight loss Schedule CPX We'll review screening lab including sedimentation  rate

## 2016-04-16 NOTE — Patient Instructions (Addendum)
Return in 3 months for follow-up  Call or return to clinic prn if these symptoms worsen or fail to improve as anticipated.  Report any further weight loss

## 2016-04-16 NOTE — Progress Notes (Signed)
Pre visit review using our clinic review tool, if applicable. No additional management support is needed unless otherwise documented below in the visit note. 

## 2016-05-28 ENCOUNTER — Ambulatory Visit
Admission: RE | Admit: 2016-05-28 | Discharge: 2016-05-28 | Disposition: A | Discharge status: Home / Self Care | Payer: Medicare Other | Source: Ambulatory Visit

## 2016-05-28 DIAGNOSIS — Z01419 Encounter for gynecological examination (general) (routine) without abnormal findings: Secondary | ICD-10-CM | POA: Diagnosis not present

## 2016-05-28 DIAGNOSIS — Z1231 Encounter for screening mammogram for malignant neoplasm of breast: Secondary | ICD-10-CM | POA: Diagnosis not present

## 2016-05-28 DIAGNOSIS — Z681 Body mass index (BMI) 19 or less, adult: Secondary | ICD-10-CM | POA: Diagnosis not present

## 2016-07-17 IMAGING — CT CT CHEST W/O CM
2 of 3 series · 15 of 36 positions shown, 18 images · IV contrast (Omnipaque 300)
Comparison: 06/12/2015

CLINICAL DATA: Followup chest x-ray showing pleural effusion.

EXAM:
CT CHEST WITHOUT CONTRAST
TECHNIQUE: Multidetector CT imaging of the chest was performed following the
standard protocol without IV contrast.

[Series 2: chest routine with · axial · 0.59mm/px · z∈[-300,-40]mm · 12 of 62 slices shown, 15 images]
[im 5/62  mediastinal]
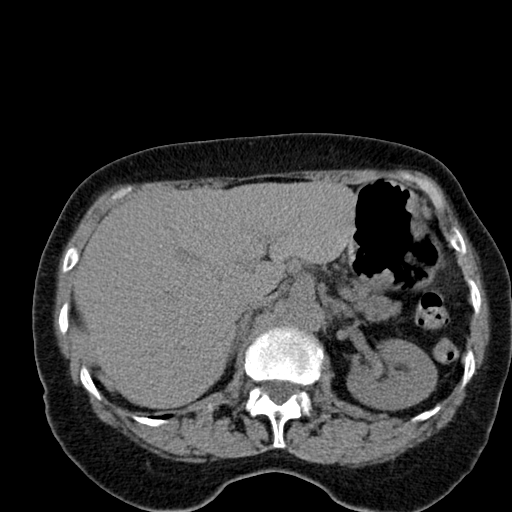
[im 5/62  lung]
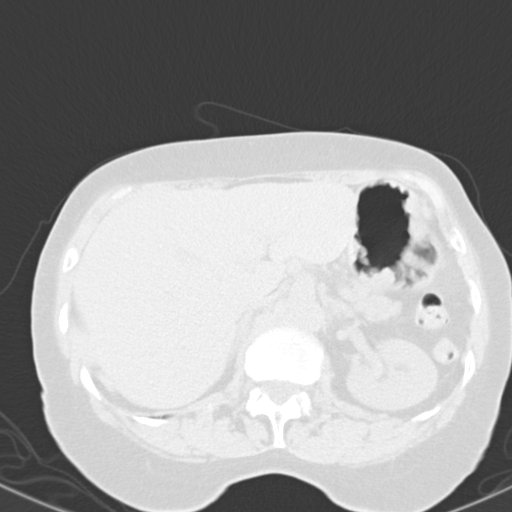
[im 10/62  lung]
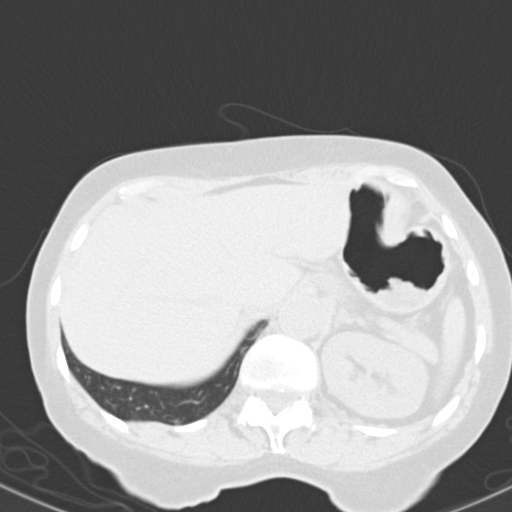
[im 14/62  lung]
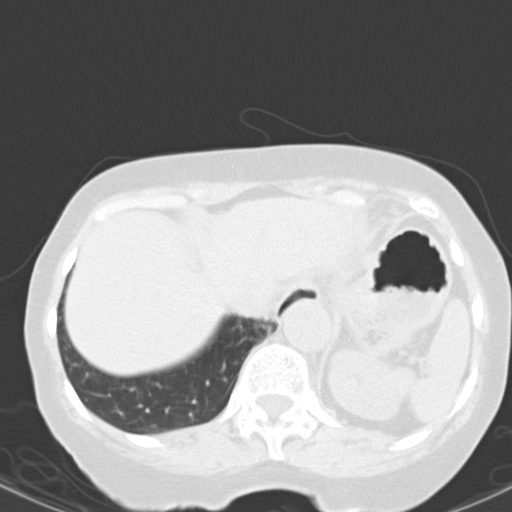
[im 19/62  lung]
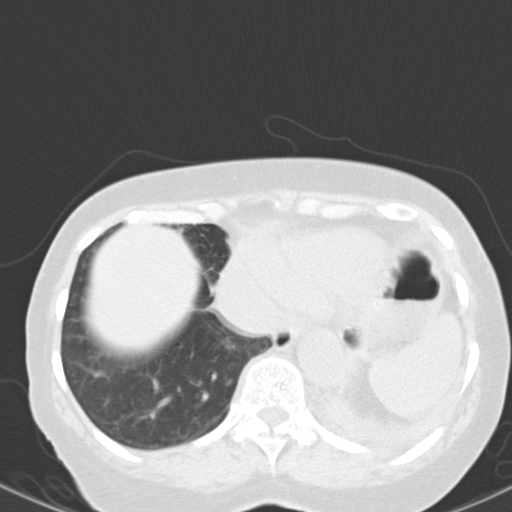
[im 23/62  mediastinal]
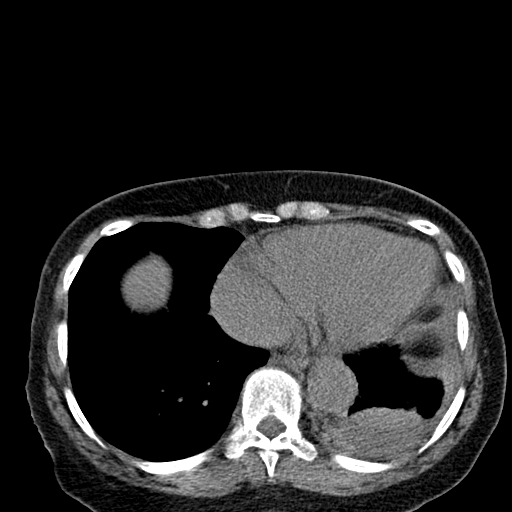
[im 23/62  lung]
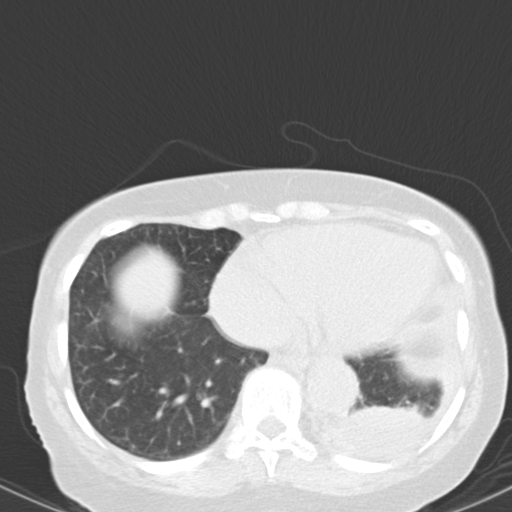
[im 28/62  lung]
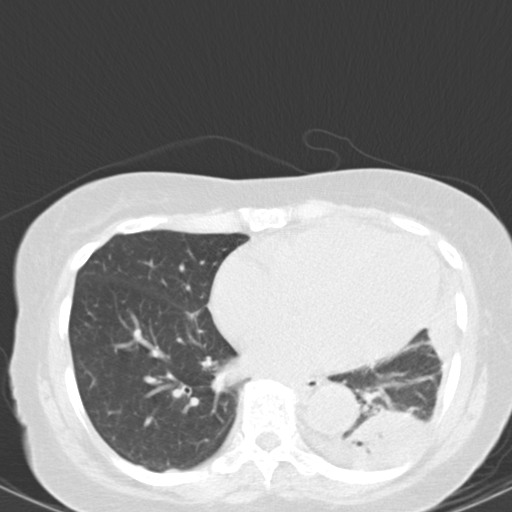
[im 34/62  lung]
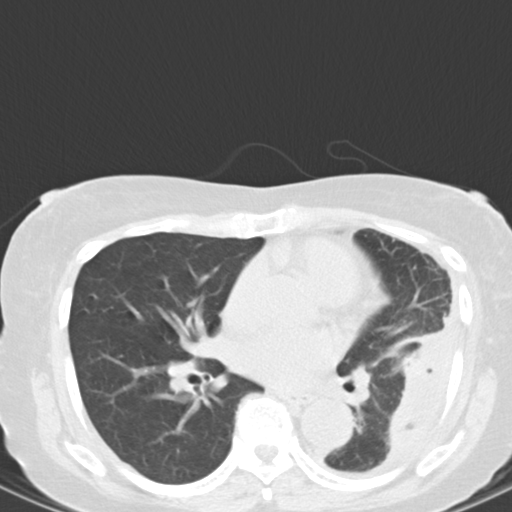
[im 39/62  lung]
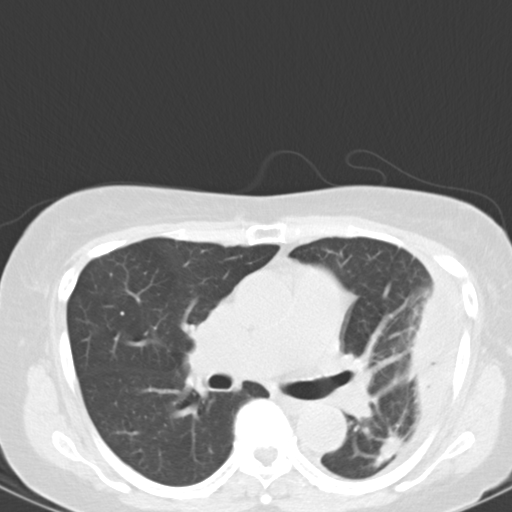
[im 43/62  mediastinal]
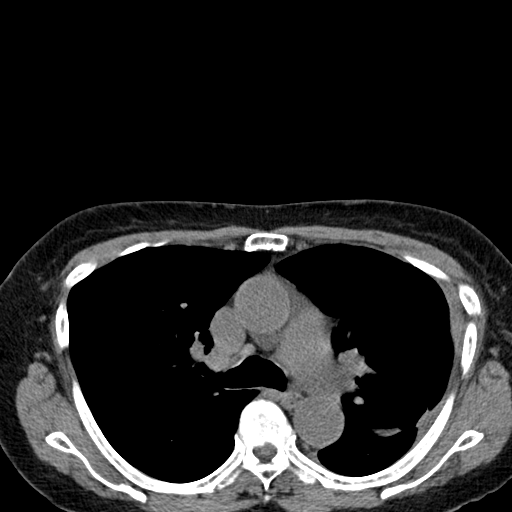
[im 43/62  lung]
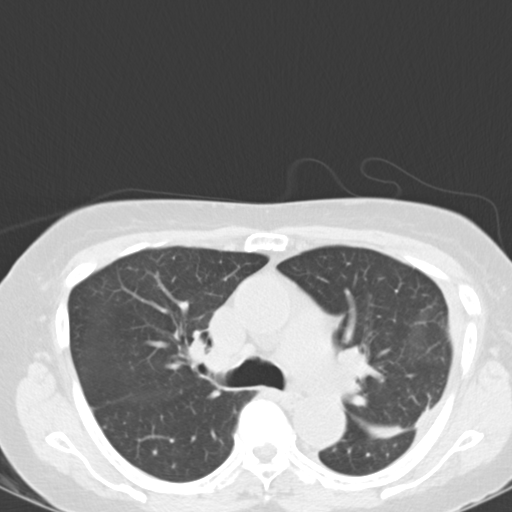
[im 48/62  lung]
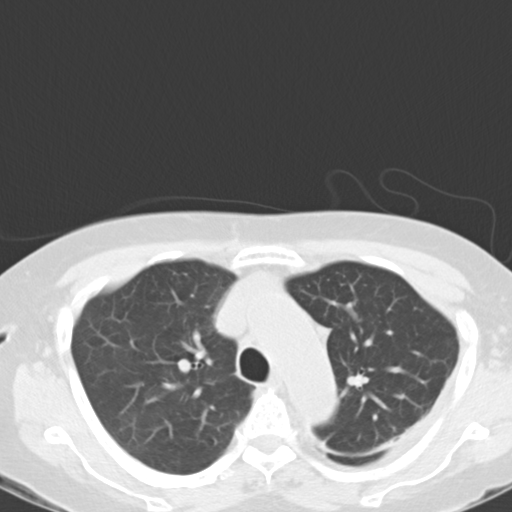
[im 52/62  lung]
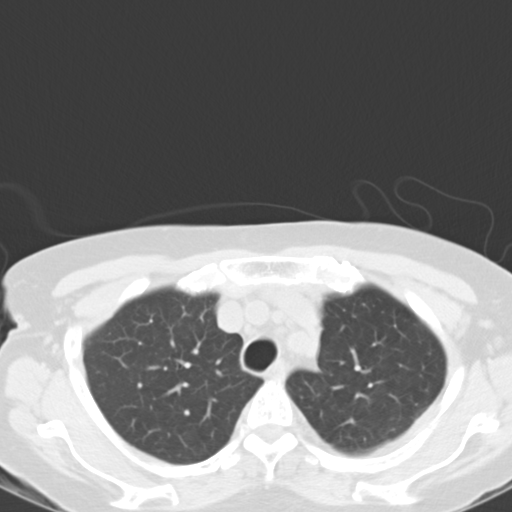
[im 57/62  lung]
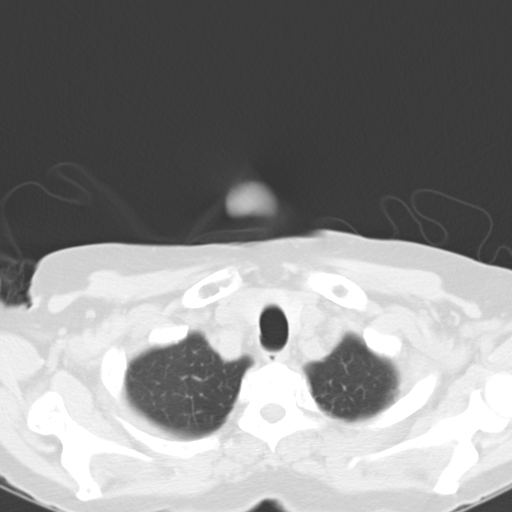

[Series 602: cor · coronal · 0.61mm/px · 3 of 96 slices shown]
[im 20/96  lung]
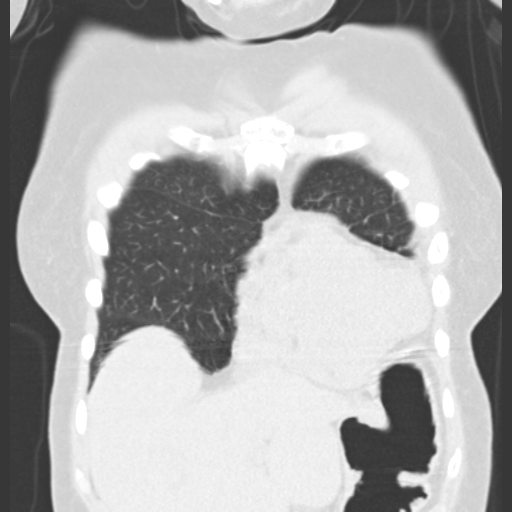
[im 39/96  lung]
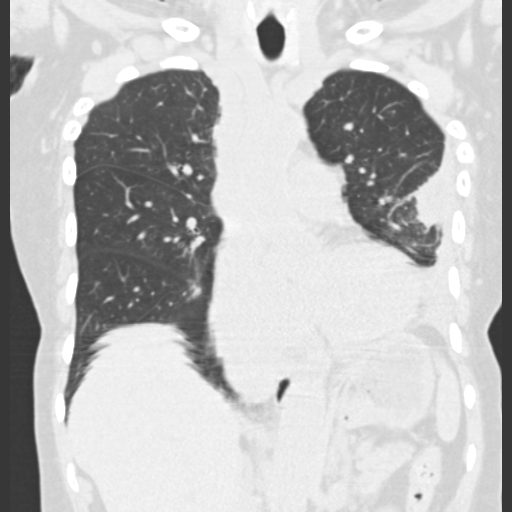
[im 58/96  lung]
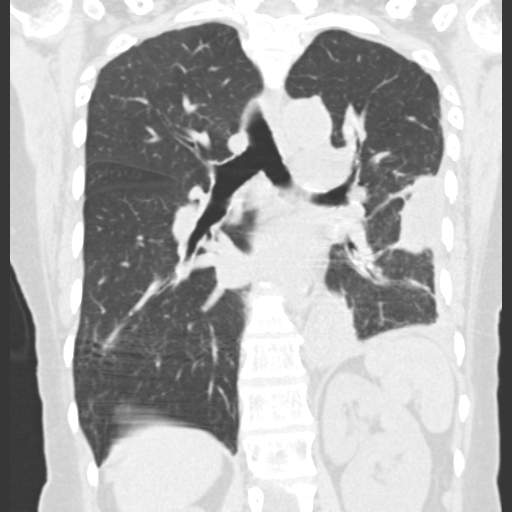

[15 of 36 positions shown; findings below may reference images not displayed]

FINDINGS: Mediastinum: The heart size appears normal. No pericardial effusion.
Calcifications within the aortic arch noted. The trachea is patent
and is midline. Normal appearance of the esophagus. No mediastinal
adenopathy identified. No evidence for hilar adenopathy.

Lungs/Pleura: There is a moderate loculated left pleural effusion
which contains several foci of gas. Solid-appearing nodule within
the left lower lobe measures 1.3 cm and has spiculated margins,
image 77 of series 602. Small perifissural nodule within the right
lower lobe measures 3 mm, image 34/series 3.

Upper Abdomen: The visualized portions of the liver a heads bleed
are normal. The visualized portions of the adrenal glands are
unremarkable.

Musculoskeletal: No aggressive lytic or sclerotic bone lesions
identified.
IMPRESSION: 1. Loculated left pleural effusion containing foci of gas may
represent an and high hemo. Consider further evaluation with
diagnostic thoracentesis.
2. 13 mm spiculated nodule in the left lower lobe is identified.
Cannot rule out small bronchogenic carcinoma. Recommend further
evaluation with PET-CT following resolution of left-sided empyema.

## 2016-08-13 ENCOUNTER — Encounter: Payer: Self-pay | Admitting: Internal Medicine

## 2016-08-13 ENCOUNTER — Ambulatory Visit (INDEPENDENT_AMBULATORY_CARE_PROVIDER_SITE_OTHER): Payer: Medicare Other | Admitting: Internal Medicine

## 2016-08-13 VITALS — BP 90/64 | HR 66 | Temp 98.4°F | Ht 64.0 in | Wt 114.0 lb

## 2016-08-13 DIAGNOSIS — R7302 Impaired glucose tolerance (oral): Secondary | ICD-10-CM | POA: Diagnosis not present

## 2016-08-13 DIAGNOSIS — I10 Essential (primary) hypertension: Secondary | ICD-10-CM | POA: Diagnosis not present

## 2016-08-13 DIAGNOSIS — Z0000 Encounter for general adult medical examination without abnormal findings: Secondary | ICD-10-CM | POA: Diagnosis not present

## 2016-08-13 DIAGNOSIS — E089 Diabetes mellitus due to underlying condition without complications: Secondary | ICD-10-CM

## 2016-08-13 DIAGNOSIS — E785 Hyperlipidemia, unspecified: Secondary | ICD-10-CM

## 2016-08-13 LAB — LIPID PANEL
CHOL/HDL RATIO: 2
Cholesterol: 153 mg/dL (ref 0–200)
HDL: 72 mg/dL (ref >39.00)
LDL Cholesterol: 69 mg/dL (ref 0–99)
NonHDL: 80.6
TRIGLYCERIDES: 59 mg/dL (ref 0.0–149.0)
VLDL: 11.8 mg/dL (ref 0.0–40.0)

## 2016-08-13 LAB — MICROALBUMIN / CREATININE URINE RATIO
Creatinine,U: 56.8 mg/dL
MICROALB/CREAT RATIO: 1.4 mg/g (ref 0.0–30.0)
Microalb, Ur: 0.8 mg/dL (ref 0.0–1.9)

## 2016-08-13 LAB — HEMOGLOBIN A1C: Hgb A1c MFr Bld: 6 % (ref 4.6–6.5)

## 2016-08-13 NOTE — Progress Notes (Signed)
Pre visit review using our clinic review tool, if applicable. No additional management support is needed unless otherwise documented below in the visit note. 

## 2016-08-13 NOTE — Patient Instructions (Signed)
Limit your sodium (Salt) intake  Please check your blood pressure on a regular basis.  If it is consistently greater than 150/90, please make an office appointment.    It is important that you exercise regularly, at least 20 minutes 3 to 4 times per week.  If you develop chest pain or shortness of breath seek  medical attention.  Take a calcium supplement, plus 800-1200 units of vitamin D 

## 2016-08-13 NOTE — Progress Notes (Signed)
Subjective:    Patient ID: [name], female    DOB: 1936/07/17, 80 y.o.   MRN: [id number]  HPI  80 year old patient who is seen today for a wellness exam.  She has a history of diabetes, controlled with diet. Her only complaint is inability to gain weight.  She has a history of chronic anxiety and is followed by multiple consultants.  Past Medical History:  Diagnosis Date  . Anxiety state    unspecified  . ARTHRITIS 09/03/2010  . Asthma   . CHOLELITHIASIS 09/03/2010  . COLONIC POLYPS, HX OF 06/02/2008  . DIVERTICULAR DISEASE 09/03/2010  . DYSPHAGIA UNSPECIFIED 09/03/2010  . ESSENTIAL HYPERTENSION 10/29/2010  . Fibromyalgia   . GALLSTONE PANCREATITIS 09/03/2010  . GERD 09/02/2007  . HEART MURMUR, HX OF 08/12/2010  . Hematuria   . Hiatal hernia   . Hx of colonoscopy    2014  . HYPERLIPIDEMIA 12/09/2007  . Insomnia   . Lichen simplex chronicus   . Low back pain    chronic  . Osteopenia   . OSTEOPOROSIS 05/17/2009  . Palpitations 09/03/2010   event monitor reveals long RP tachycardia, likely sinus tachycardia  . Paresthesia   . Polyuria   . RBBB 09/05/2010  . VENTRICULAR HYPERTROPHY, LEFT 08/12/2010  . VITAMIN B12 DEFICIENCY 09/02/2007  . WEIGHT LOSS, ABNORMAL 06/02/2008     Social History   Social History  . Marital status: Widowed    Spouse name: N/A  . Number of children: 6  . Years of education: N/A   Occupational History  . retired    Social History Main Topics  . Smoking status: Never Smoker  . Smokeless tobacco: Never Used  . Alcohol use No  . Drug use: No  . Sexual activity: No   Other Topics Concern  . Not on file   Social History Narrative   Widowed   2 children   Lives in [place]   Retired from [employer]    Past Surgical History:  Procedure Laterality Date  . ABDOMINAL HYSTERECTOMY     THA and O  . APPENDECTOMY    . CHOLECYSTECTOMY    . ESOPHAGOGASTRODUODENOSCOPY     with HH and esophagitis  . OOPHORECTOMY    . SPINE SURGERY     cervical fusion     Family History  Problem Relation Age of Onset  . Heart disease Mother   . Stroke Mother   . Prostate cancer Father     Allergies  Allergen Reactions  . Celebrex [Celecoxib] Rash    REACTION: rash    Current Outpatient Prescriptions on File Prior to Visit  Medication Sig Dispense Refill  . ALPRAZolam (XANAX) 1 MG tablet Take 1 tablet (1 mg total) by mouth 2 (two) times daily. 60 tablet 2  . aspirin 81 MG tablet Take 81 mg by mouth daily.    . Calcium Carbonate-Vitamin D (CALTRATE 600+D) 600-400 MG-UNIT per chew tablet Chew 1 tablet by mouth 2 (two) times daily.      . Coenzyme Q10 (COQ10 PO) Take 1 tablet by mouth daily.     . Cyanocobalamin (VITAMIN B 12 PO) Take 100 mcg by mouth daily.    . Echinacea-Vitamin C 250-250 MG CAPS Take 1 capsule by mouth daily.     . fluticasone (FLOVENT HFA) 110 MCG/ACT inhaler Inhale 2 puffs into the lungs 2 (two) times daily as needed (for wheezing & SOB). PATIENT STATES USES PRN    . Magnesium 250 MG TABS Take  250 mg by mouth every morning.     . meloxicam (MOBIC) 7.5 MG tablet Take 1 tablet (7.5 mg total) by mouth daily. 90 tablet 1  . metoprolol (LOPRESSOR) 25 MG tablet Take 1 tablet (25 mg total) by mouth 2 (two) times daily. 60 tablet 11  . Multiple Vitamin (MULTIVITAMIN) tablet Take 1 tablet by mouth daily.      Marland Kitchen POTASSIUM CHLORIDE PO Take 99 mg by mouth daily.      No current facility-administered medications on file prior to visit.     BP 90/64 (BP Location: Left Arm, Patient Position: Sitting, Cuff Size: Normal)   Pulse 66   Temp 98.4 F (36.9 C) (Oral)   Ht 5\' 4"  (1.626 m)   Wt 114 lb (51.7 kg)   SpO2 99%   BMI 19.57 kg/m   Medicare wellness exam  1. Risk factors, based on past  M,S,F history.  Cardio- vascular risk factors include a history of impaired glucose tolerance  2.  Physical activities: No activity restrictions  3.  Depression/mood: No history of major depression but does have chronic generalized anxiety  disorder  4.  Hearing: No deficits  5.  ADL's: Independent  6.  Fall risk: Low  7.  Home safety: No problems identified  8.  Height weight, and visual acuity; height and weight stable except for some modest weight loss.  Is followed by ophthalmology.  Has a cataract that is being followed.  No change in visual acuity  9.  Counseling: Continue heart healthy diet  10. Lab orders based on risk factors: Laboratory studies reviewed from the spring.  Patient has been seen by endocrinology with thyroid function studies   11. Referral : Not appropriate at this time.  Patient has had recent mammogram and GYN evaluation  12. Care plan: Continue efforts at aggressive risk factor modification  13. Cognitive assessment: Alert and oriented with normal affect, chronic anxiety.  No history of cognitive dysfunction  14. Screening: Patient provided with a written and personalized 5-10 year screening schedule in the AVS.    15. Provider List Update: Primary care cardiology, endocrinology pulmonary general surgery OB/GYN    Review of Systems  Constitutional: Positive for unexpected weight change.  HENT: Negative for congestion, dental problem, hearing loss, rhinorrhea, sinus pressure, sore throat and tinnitus.   Eyes: Negative for pain, discharge and visual disturbance.  Respiratory: Negative for cough and shortness of breath.   Cardiovascular: Negative for chest pain, palpitations and leg swelling.  Gastrointestinal: Negative for abdominal distention, abdominal pain, blood in stool, constipation, diarrhea, nausea and vomiting.  Genitourinary: Negative for difficulty urinating, dysuria, flank pain, frequency, hematuria, pelvic pain, urgency, vaginal bleeding, vaginal discharge and vaginal pain.  Musculoskeletal: Negative for arthralgias, gait problem and joint swelling.  Skin: Negative for rash.  Neurological: Negative for dizziness, syncope, speech difficulty, weakness, numbness and headaches.    Hematological: Negative for adenopathy.  Psychiatric/Behavioral: Positive for sleep disturbance. Negative for agitation, behavioral problems and dysphoric mood. The patient is nervous/anxious.        Objective:   Physical Exam  Constitutional: She is oriented to person, place, and time. She appears well-developed and well-nourished.  Thin anxious Blood pressure low normal  HENT:  Head: Normocephalic.  Right Ear: External ear normal.  Left Ear: External ear normal.  Mouth/Throat: Oropharynx is clear and moist.  Eyes: Conjunctivae and EOM are normal. Pupils are equal, round, and reactive to light.  Neck: Normal range of motion. Neck supple. No thyromegaly  present.  Cardiovascular: Normal rate, regular rhythm, normal heart sounds and intact distal pulses.   Frequent ectopics  Pulmonary/Chest: Effort normal and breath sounds normal.  Abdominal: Soft. Bowel sounds are normal. She exhibits no mass. There is no tenderness.  Musculoskeletal: Normal range of motion.  Lymphadenopathy:    She has no cervical adenopathy.  Neurological: She is alert and oriented to person, place, and time.  Skin: Skin is warm and dry. No rash noted.  Psychiatric: She has a normal mood and affect. Her behavior is normal.          Assessment & Plan:   Preventive health exam.  Generalized anxiety disorder Menopausal syndrome Diet-controlled diabetes.  Will check hemoglobin A1c and urine for microalbumin Essential hypertension  Recheck 6 months  Nyoka Cowden, MD

## 2016-09-10 ENCOUNTER — Other Ambulatory Visit: Payer: Self-pay | Admitting: Internal Medicine

## 2016-09-12 ENCOUNTER — Ambulatory Visit (INDEPENDENT_AMBULATORY_CARE_PROVIDER_SITE_OTHER): Payer: Medicare Other | Admitting: *Deleted

## 2016-09-12 DIAGNOSIS — Z23 Encounter for immunization: Secondary | ICD-10-CM

## 2017-01-09 ENCOUNTER — Other Ambulatory Visit: Payer: Self-pay | Admitting: Internal Medicine

## 2017-02-11 ENCOUNTER — Ambulatory Visit: Payer: Medicare Other | Admitting: Internal Medicine

## 2017-02-12 ENCOUNTER — Ambulatory Visit (INDEPENDENT_AMBULATORY_CARE_PROVIDER_SITE_OTHER): Payer: Medicare Other | Admitting: Internal Medicine

## 2017-02-12 ENCOUNTER — Encounter: Payer: Self-pay | Admitting: Internal Medicine

## 2017-02-12 VITALS — BP 118/62 | HR 65 | Temp 98.1°F | Ht 64.0 in | Wt 119.6 lb

## 2017-02-12 DIAGNOSIS — R634 Abnormal weight loss: Secondary | ICD-10-CM

## 2017-02-12 DIAGNOSIS — R7302 Impaired glucose tolerance (oral): Secondary | ICD-10-CM

## 2017-02-12 DIAGNOSIS — F411 Generalized anxiety disorder: Secondary | ICD-10-CM

## 2017-02-12 DIAGNOSIS — I10 Essential (primary) hypertension: Secondary | ICD-10-CM | POA: Diagnosis not present

## 2017-02-12 DIAGNOSIS — F5101 Primary insomnia: Secondary | ICD-10-CM

## 2017-02-12 MED ORDER — SERTRALINE HCL 25 MG PO TABS: 25.0000 mg | ORAL_TABLET | Freq: Every day | ORAL | 3 refills | Status: DC

## 2017-02-12 MED ORDER — ALPRAZOLAM 1 MG PO TABS: 1.0000 mg | ORAL_TABLET | Freq: Two times a day (BID) | ORAL | 2 refills | Status: DC

## 2017-02-12 NOTE — Patient Instructions (Signed)
It is important that you exercise regularly, at least 20 minutes 3 to 4 times per week.  If you develop chest pain or shortness of breath seek  medical attention.  Discontinue Effexor  Start sertraline 25 mg once daily  Return in 6 months for follow-up

## 2017-02-12 NOTE — Progress Notes (Signed)
Pre visit review using our clinic review tool, if applicable. No additional management support is needed unless otherwise documented below in the visit note. 

## 2017-02-12 NOTE — Progress Notes (Signed)
Subjective:    Patient ID: [name], female    DOB: Jul 27, 1936, 81 y.o.   MRN: [id number]  HPI  81 year old patient who is seen today for her six-month follow-up.  She has had a nice 6 pound weight gain over the past several months.  She is on a number of supplements as well as a probiotic.  She has a history of anxiety disorder and has been on alprazolam.  She has also been on Effexor prescribed by OB/GYN in the past.  She feels that this has not been very effective.  She describes herself as a Statistician.  Otherwise, doing well.  She does have a history of impaired glucose tolerance as well as essential hypertension.  Past Medical History:  Diagnosis Date  . Anxiety state    unspecified  . ARTHRITIS 09/03/2010  . Asthma   . CHOLELITHIASIS 09/03/2010  . COLONIC POLYPS, HX OF 06/02/2008  . DIVERTICULAR DISEASE 09/03/2010  . DYSPHAGIA UNSPECIFIED 09/03/2010  . ESSENTIAL HYPERTENSION 10/29/2010  . Fibromyalgia   . GALLSTONE PANCREATITIS 09/03/2010  . GERD 09/02/2007  . HEART MURMUR, HX OF 08/12/2010  . Hematuria   . Hiatal hernia   . Hx of colonoscopy    2014  . HYPERLIPIDEMIA 12/09/2007  . Insomnia   . Lichen simplex chronicus   . Low back pain    chronic  . Osteopenia   . OSTEOPOROSIS 05/17/2009  . Palpitations 09/03/2010   event monitor reveals long RP tachycardia, likely sinus tachycardia  . Paresthesia   . Polyuria   . RBBB 09/05/2010  . VENTRICULAR HYPERTROPHY, LEFT 08/12/2010  . VITAMIN B12 DEFICIENCY 09/02/2007  . WEIGHT LOSS, ABNORMAL 06/02/2008     Social History   Social History  . Marital status: Widowed    Spouse name: N/A  . Number of children: 6  . Years of education: N/A   Occupational History  . retired    Social History Main Topics  . Smoking status: Never Smoker  . Smokeless tobacco: Never Used  . Alcohol use No  . Drug use: No  . Sexual activity: No   Other Topics Concern  . Not on file   Social History Narrative   Widowed   2 children   Lives in  [place]   Retired from [employer]    Past Surgical History:  Procedure Laterality Date  . ABDOMINAL HYSTERECTOMY     THA and O  . APPENDECTOMY    . CHOLECYSTECTOMY    . ESOPHAGOGASTRODUODENOSCOPY     with HH and esophagitis  . OOPHORECTOMY    . SPINE SURGERY     cervical fusion    Family History  Problem Relation Age of Onset  . Heart disease Mother   . Stroke Mother   . Prostate cancer Father     Allergies  Allergen Reactions  . Celebrex [Celecoxib] Rash    REACTION: rash    Current Outpatient Prescriptions on File Prior to Visit  Medication Sig Dispense Refill  . aspirin 81 MG tablet Take 81 mg by mouth daily.    . Calcium Carbonate-Vitamin D (CALTRATE 600+D) 600-400 MG-UNIT per chew tablet Chew 1 tablet by mouth 2 (two) times daily.      . Cyanocobalamin (VITAMIN B 12 PO) Take 100 mcg by mouth daily.    . fluticasone (FLOVENT HFA) 110 MCG/ACT inhaler Inhale 2 puffs into the lungs 2 (two) times daily as needed (for wheezing & SOB). PATIENT STATES USES PRN    .  meloxicam (MOBIC) 7.5 MG tablet Take 1 tablet (7.5 mg total) by mouth daily. 90 tablet 1  . metoprolol (LOPRESSOR) 25 MG tablet Take 1 tablet (25 mg total) by mouth 2 (two) times daily. 60 tablet 11  . Multiple Vitamin (MULTIVITAMIN) tablet Take 1 tablet by mouth daily.       No current facility-administered medications on file prior to visit.     BP 118/62 (BP Location: Left Arm, Patient Position: Sitting, Cuff Size: Normal)   Pulse 65   Temp 98.1 F (36.7 C) (Oral)   Ht 5\' 4"  (1.626 m)   Wt 119 lb 9.6 oz (54.3 kg)   SpO2 95%   BMI 20.53 kg/m     Review of Systems  Constitutional: Positive for unexpected weight change.  Psychiatric/Behavioral: Positive for sleep disturbance. The patient is nervous/anxious.        Objective:   Physical Exam  Constitutional: She is oriented to person, place, and time. She appears well-developed and well-nourished.  Anxious Blood pressure well controlled in a  low-normal range  HENT:  Head: Normocephalic.  Right Ear: External ear normal.  Left Ear: External ear normal.  Mouth/Throat: Oropharynx is clear and moist.  Eyes: Conjunctivae and EOM are normal. Pupils are equal, round, and reactive to light.  Neck: Normal range of motion. Neck supple. No thyromegaly present.  Cardiovascular: Normal rate, regular rhythm, normal heart sounds and intact distal pulses.   Pulmonary/Chest: Effort normal and breath sounds normal.  Abdominal: Soft. Bowel sounds are normal. She exhibits no mass. There is no tenderness.  Musculoskeletal: Normal range of motion.  Lymphadenopathy:    She has no cervical adenopathy.  Neurological: She is alert and oriented to person, place, and time.  Skin: Skin is warm and dry. No rash noted.  Psychiatric: She has a normal mood and affect. Her behavior is normal.          Assessment & Plan:   Essential hypertension.  Blood pressure well controlled Anxiety disorder.  We'll discontinue Effexor and substitute sertraline 25 mg daily.  Continue alprazolam when necessary Impaired glucose tolerance History weight loss.  Improved.  Weight is up a proximally 6 pounds  CPX 6 months  Nyoka Cowden

## 2017-02-13 NOTE — Progress Notes (Signed)
Patient ID: [name], female   DOB: 04-02-1936, 81 y.o.   MRN: [id number] [name] is seen today for f/U of palpitations She has had daily palptations when initially seen 2011 . No previous heart history. ? History of murmur. Indicates they are not related to exertion. Made worse by caffeine. ECG 07/1511 [facility] NSR with RBBB. She feels more jittery in general and may be having an anxiety state. Reviewed lab work form 8/15 and TSH was normal. No SSCP dyspnea or syncope. Palpitatons can last minutes and occure daily.  Monitor subsequently documented long R-P tachycardia and patient seen by Dr Rayann Heman . Recommended continued beta blocker Rx  No significant palpitations  Taking lopressor bid    Seeing Pulmonary for loculated left pleural effusion and 60mm lung nodule.  F/U CT September 2016 improved after 3 weeks Augmentin Rx  IMPRESSION: 1. Interval resolution of the inflammatory or infectious nodule in superior segment of the LEFT lower lobe. 2. Near complete resolution of the loculated pleural fluid collection in the lower LEFT hemi thorax.  BP a bit low and she complains of dizziness occasionally  Complains about weight loss despite good appetite  Widowed for 16 years but worries about her kids/grand kids     ROS: Denies fever, malais, weight loss, blurry vision, decreased visual acuity, cough, sputum, SOB, hemoptysis, pleuritic pain, palpitaitons, heartburn, abdominal pain, melena, lower extremity edema, claudication, or rash.  All other systems reviewed and negative  General: Anxious  Black female  Looks younger than stated age  69: normal Neck supple with no adenopathy JVP normal no bruits no thyromegaly Lungs clear with no wheezing and good diaphragmatic motion Heart:  S1/S2 SEM  murmur, no rub, gallop or click PMI normal Abdomen: benighn, BS positve, no tenderness, no AAA no bruit.  No HSM or HJR Distal pulses intact with no bruits No edema Neuro non-focal Skin  warm and dry No muscular weakness   Current Outpatient Prescriptions  Medication Sig Dispense Refill  . ALPRAZolam (XANAX) 1 MG tablet Take 1 tablet (1 mg total) by mouth 2 (two) times daily. 60 tablet 2  . aspirin 81 MG tablet Take 81 mg by mouth daily.    . Calcium Carbonate-Vitamin D (CALTRATE 600+D) 600-400 MG-UNIT per chew tablet Chew 1 tablet by mouth 2 (two) times daily.      . Cyanocobalamin (VITAMIN B 12 PO) Take 100 mcg by mouth daily.    . fluticasone (FLOVENT HFA) 110 MCG/ACT inhaler Inhale 2 puffs into the lungs 2 (two) times daily as needed (for wheezing & SOB). PATIENT STATES USES PRN    . meloxicam (MOBIC) 7.5 MG tablet Take 1 tablet (7.5 mg total) by mouth daily. 90 tablet 1  . metoprolol (LOPRESSOR) 25 MG tablet Take 1 tablet (25 mg total) by mouth 2 (two) times daily. 60 tablet 11  . Multiple Vitamin (MULTIVITAMIN) tablet Take 1 tablet by mouth daily.      . sertraline (ZOLOFT) 25 MG tablet Take 1 tablet (25 mg total) by mouth daily. 90 tablet 3   No current facility-administered medications for this visit.     Allergies  Celebrex [celecoxib]  Electrocardiogram:   06/11/15 SR rate 80 RBBB ? Limb lead reversal  02/24/17  SR rate 58 RBBB LAFB LVH   Assessment and Plan SVT stable on beta blocker no need to entertain ablation at this point in time Decrease lopresser to 1/2 tab bid due to lower BP Pulm: CT with improved inflammatory changes  Anxiety: now on zoloft f/u Dr Jettie Pagan

## 2017-02-24 ENCOUNTER — Encounter: Payer: Self-pay | Admitting: Cardiovascular Disease

## 2017-02-24 ENCOUNTER — Ambulatory Visit (INDEPENDENT_AMBULATORY_CARE_PROVIDER_SITE_OTHER): Payer: Medicare Other | Admitting: Cardiovascular Disease

## 2017-02-24 VITALS — BP 114/64 | HR 59 | Ht 66.0 in | Wt 119.0 lb

## 2017-02-24 DIAGNOSIS — I4510 Unspecified right bundle-branch block: Secondary | ICD-10-CM | POA: Diagnosis not present

## 2017-02-24 DIAGNOSIS — R002 Palpitations: Secondary | ICD-10-CM

## 2017-02-24 MED ORDER — METOPROLOL TARTRATE 25 MG PO TABS: 25.0000 mg | ORAL_TABLET | Freq: Two times a day (BID) | ORAL | 3 refills | Status: DC

## 2017-02-24 NOTE — Patient Instructions (Signed)

## 2017-03-24 ENCOUNTER — Other Ambulatory Visit: Payer: Self-pay | Admitting: Internal Medicine

## 2017-03-24 DIAGNOSIS — Z1231 Encounter for screening mammogram for malignant neoplasm of breast: Secondary | ICD-10-CM

## 2017-06-03 ENCOUNTER — Ambulatory Visit
Admission: RE | Admit: 2017-06-03 | Discharge: 2017-06-03 | Disposition: A | Discharge status: Home / Self Care | Payer: Medicare Other | Source: Ambulatory Visit | Attending: Internal Medicine | Admitting: Internal Medicine

## 2017-06-03 DIAGNOSIS — Z1231 Encounter for screening mammogram for malignant neoplasm of breast: Secondary | ICD-10-CM

## 2017-06-03 DIAGNOSIS — Z01419 Encounter for gynecological examination (general) (routine) without abnormal findings: Secondary | ICD-10-CM | POA: Diagnosis not present

## 2017-07-17 ENCOUNTER — Other Ambulatory Visit: Payer: Self-pay | Admitting: Internal Medicine

## 2017-08-17 ENCOUNTER — Encounter: Payer: Medicare Other | Admitting: Internal Medicine

## 2017-08-25 ENCOUNTER — Ambulatory Visit (INDEPENDENT_AMBULATORY_CARE_PROVIDER_SITE_OTHER): Payer: Medicare Other | Admitting: Internal Medicine

## 2017-08-25 ENCOUNTER — Encounter: Payer: Self-pay | Admitting: Internal Medicine

## 2017-08-25 VITALS — BP 112/58 | HR 57 | Temp 97.8°F | Ht 66.0 in | Wt 118.2 lb

## 2017-08-25 DIAGNOSIS — R7302 Impaired glucose tolerance (oral): Secondary | ICD-10-CM | POA: Diagnosis not present

## 2017-08-25 DIAGNOSIS — M818 Other osteoporosis without current pathological fracture: Secondary | ICD-10-CM

## 2017-08-25 DIAGNOSIS — E089 Diabetes mellitus due to underlying condition without complications: Secondary | ICD-10-CM

## 2017-08-25 DIAGNOSIS — F419 Anxiety disorder, unspecified: Secondary | ICD-10-CM

## 2017-08-25 DIAGNOSIS — Z0000 Encounter for general adult medical examination without abnormal findings: Secondary | ICD-10-CM

## 2017-08-25 DIAGNOSIS — Z86010 Personal history of colonic polyps: Secondary | ICD-10-CM

## 2017-08-25 DIAGNOSIS — I10 Essential (primary) hypertension: Secondary | ICD-10-CM

## 2017-08-25 DIAGNOSIS — K219 Gastro-esophageal reflux disease without esophagitis: Secondary | ICD-10-CM

## 2017-08-25 DIAGNOSIS — E7800 Pure hypercholesterolemia, unspecified: Secondary | ICD-10-CM

## 2017-08-25 LAB — CBC WITH DIFFERENTIAL/PLATELET
Basophils Absolute: 0 10*3/uL (ref 0.0–0.1)
Basophils Relative: 0.7 % (ref 0.0–3.0)
Eosinophils Absolute: 0.2 10*3/uL (ref 0.0–0.7)
Eosinophils Relative: 4.6 % (ref 0.0–5.0)
HEMATOCRIT: 37.9 % (ref 36.0–46.0)
Hemoglobin: 12.6 g/dL (ref 12.0–15.0)
LYMPHS PCT: 28.3 % (ref 12.0–46.0)
Lymphs Abs: 1.5 10*3/uL (ref 0.7–4.0)
MCHC: 33.2 g/dL (ref 30.0–36.0)
MCV: 91.2 fl (ref 78.0–100.0)
MONOS PCT: 8.3 % (ref 3.0–12.0)
Monocytes Absolute: 0.4 10*3/uL (ref 0.1–1.0)
Neutro Abs: 3 10*3/uL (ref 1.4–7.7)
Neutrophils Relative %: 58.1 % (ref 43.0–77.0)
Platelets: 263 10*3/uL (ref 150.0–400.0)
RBC: 4.15 Mil/uL (ref 3.87–5.11)
RDW: 14.1 % (ref 11.5–15.5)
WBC: 5.2 10*3/uL (ref 4.0–10.5)

## 2017-08-25 LAB — COMPREHENSIVE METABOLIC PANEL
ALT: 24 U/L (ref 0–35)
AST: 20 U/L (ref 0–37)
Albumin: 4 g/dL (ref 3.5–5.2)
Alkaline Phosphatase: 103 U/L (ref 39–117)
BILIRUBIN TOTAL: 0.8 mg/dL (ref 0.2–1.2)
BUN: 10 mg/dL (ref 6–23)
CALCIUM: 9.3 mg/dL (ref 8.4–10.5)
CO2: 33 meq/L — AB (ref 19–32)
CREATININE: 0.49 mg/dL (ref 0.40–1.20)
Chloride: 102 mEq/L (ref 96–112)
GFR: 156.43 mL/min (ref >60.00)
Glucose, Bld: 94 mg/dL (ref 70–99)
Potassium: 3.9 mEq/L (ref 3.5–5.1)
Sodium: 137 mEq/L (ref 135–145)
Total Protein: 6.6 g/dL (ref 6.0–8.3)

## 2017-08-25 LAB — LIPID PANEL
CHOLESTEROL: 152 mg/dL (ref 0–200)
HDL: 62.5 mg/dL (ref >39.00)
LDL Cholesterol: 79 mg/dL (ref 0–99)
NonHDL: 89.73
TRIGLYCERIDES: 56 mg/dL (ref 0.0–149.0)
Total CHOL/HDL Ratio: 2
VLDL: 11.2 mg/dL (ref 0.0–40.0)

## 2017-08-25 LAB — MICROALBUMIN / CREATININE URINE RATIO
Creatinine,U: 83.4 mg/dL
MICROALB UR: 0.7 mg/dL (ref 0.0–1.9)
Microalb Creat Ratio: 0.8 mg/g (ref 0.0–30.0)

## 2017-08-25 LAB — TSH: TSH: 1.33 u[IU]/mL (ref 0.35–4.50)

## 2017-08-25 LAB — HEMOGLOBIN A1C: Hgb A1c MFr Bld: 5.8 % (ref 4.6–6.5)

## 2017-08-25 MED ORDER — ALPRAZOLAM 1 MG PO TABS: 1.0000 mg | ORAL_TABLET | Freq: Two times a day (BID) | ORAL | 0 refills | Status: DC

## 2017-08-25 NOTE — Patient Instructions (Addendum)
Take a calcium supplement, plus [phone] units of vitamin D    It is important that you exercise regularly, at least 20 minutes 3 to 4 times per week.  If you develop chest pain or shortness of breath seek  medical attention.   Health Maintenance for Postmenopausal Women Menopause is a normal process in which your reproductive ability comes to an end. This process happens gradually over a span of months to years, usually between the ages of 76 and 33. Menopause is complete when you have missed 12 consecutive menstrual periods. It is important to talk with your health care provider about some of the most common conditions that affect postmenopausal women, such as heart disease, cancer, and bone loss (osteoporosis). Adopting a healthy lifestyle and getting preventive care can help to promote your health and wellness. Those actions can also lower your chances of developing some of these common conditions. What should I know about menopause? During menopause, you may experience a number of symptoms, such as:  Moderate-to-severe hot flashes.  Night sweats.  Decrease in sex drive.  Mood swings.  Headaches.  Tiredness.  Irritability.  Memory problems.  Insomnia.  Choosing to treat or not to treat menopausal changes is an individual decision that you make with your health care provider. What should I know about hormone replacement therapy and supplements? Hormone therapy products are effective for treating symptoms that are associated with menopause, such as hot flashes and night sweats. Hormone replacement carries certain risks, especially as you become older. If you are thinking about using estrogen or estrogen with progestin treatments, discuss the benefits and risks with your health care provider. What should I know about heart disease and stroke? Heart disease, heart attack, and stroke become more likely as you age. This may be due, in part, to the hormonal changes that your body  experiences during menopause. These can affect how your body processes dietary fats, triglycerides, and cholesterol. Heart attack and stroke are both medical emergencies. There are many things that you can do to help prevent heart disease and stroke:  Have your blood pressure checked at least every 1-2 years. High blood pressure causes heart disease and increases the risk of stroke.  If you are 12-52 years old, ask your health care provider if you should take aspirin to prevent a heart attack or a stroke.  Do not use any tobacco products, including cigarettes, chewing tobacco, or electronic cigarettes. If you need help quitting, ask your health care provider.  It is important to eat a healthy diet and maintain a healthy weight. ? Be sure to include plenty of vegetables, fruits, low-fat dairy products, and lean protein. ? Avoid eating foods that are high in solid fats, added sugars, or salt (sodium).  Get regular exercise. This is one of the most important things that you can do for your health. ? Try to exercise for at least 150 minutes each week. The type of exercise that you do should increase your heart rate and make you sweat. This is known as moderate-intensity exercise. ? Try to do strengthening exercises at least twice each week. Do these in addition to the moderate-intensity exercise.  Know your numbers.Ask your health care provider to check your cholesterol and your blood glucose. Continue to have your blood tested as directed by your health care provider.  What should I know about cancer screening? There are several types of cancer. Take the following steps to reduce your risk and to catch any cancer development as  early as possible. Breast Cancer  Practice breast self-awareness. ? This means understanding how your breasts normally appear and feel. ? It also means doing regular breast self-exams. Let your health care provider know about any changes, no matter how small.  If you  are 35 or older, have a clinician do a breast exam (clinical breast exam or CBE) every year. Depending on your age, family history, and medical history, it may be recommended that you also have a yearly breast X-ray (mammogram).  If you have a family history of breast cancer, talk with your health care provider about genetic screening.  If you are at high risk for breast cancer, talk with your health care provider about having an MRI and a mammogram every year.  Breast cancer (BRCA) gene test is recommended for women who have family members with BRCA-related cancers. Results of the assessment will determine the need for genetic counseling and BRCA1 and for BRCA2 testing. BRCA-related cancers include these types: ? Breast. This occurs in males or females. ? Ovarian. ? Tubal. This may also be called fallopian tube cancer. ? Cancer of the abdominal or pelvic lining (peritoneal cancer). ? Prostate. ? Pancreatic.  Cervical, Uterine, and Ovarian Cancer Your health care provider may recommend that you be screened regularly for cancer of the pelvic organs. These include your ovaries, uterus, and vagina. This screening involves a pelvic exam, which includes checking for microscopic changes to the surface of your cervix (Pap test).  For women ages 21-65, health care providers may recommend a pelvic exam and a Pap test every three years. For women ages 74-65, they may recommend the Pap test and pelvic exam, combined with testing for human papilloma virus (HPV), every five years. Some types of HPV increase your risk of cervical cancer. Testing for HPV may also be done on women of any age who have unclear Pap test results.  Other health care providers may not recommend any screening for nonpregnant women who are considered low risk for pelvic cancer and have no symptoms. Ask your health care provider if a screening pelvic exam is right for you.  If you have had past treatment for cervical cancer or a  condition that could lead to cancer, you need Pap tests and screening for cancer for at least 20 years after your treatment. If Pap tests have been discontinued for you, your risk factors (such as having a new sexual partner) need to be reassessed to determine if you should start having screenings again. Some women have medical problems that increase the chance of getting cervical cancer. In these cases, your health care provider may recommend that you have screening and Pap tests more often.  If you have a family history of uterine cancer or ovarian cancer, talk with your health care provider about genetic screening.  If you have vaginal bleeding after reaching menopause, tell your health care provider.  There are currently no reliable tests available to screen for ovarian cancer.  Lung Cancer Lung cancer screening is recommended for adults 67-54 years old who are at high risk for lung cancer because of a history of smoking. A yearly low-dose CT scan of the lungs is recommended if you:  Currently smoke.  Have a history of at least 30 pack-years of smoking and you currently smoke or have quit within the past 15 years. A pack-year is smoking an average of one pack of cigarettes per day for one year.  Yearly screening should:  Continue until it has been  15 years since you quit.  Stop if you develop a health problem that would prevent you from having lung cancer treatment.  Colorectal Cancer  This type of cancer can be detected and can often be prevented.  Routine colorectal cancer screening usually begins at age 17 and continues through age 44.  If you have risk factors for colon cancer, your health care provider may recommend that you be screened at an earlier age.  If you have a family history of colorectal cancer, talk with your health care provider about genetic screening.  Your health care provider may also recommend using home test kits to check for hidden blood in your stool.  A  small camera at the end of a tube can be used to examine your colon directly (sigmoidoscopy or colonoscopy). This is done to check for the earliest forms of colorectal cancer.  Direct examination of the colon should be repeated every 5-10 years until age 42. However, if early forms of precancerous polyps or small growths are found or if you have a family history or genetic risk for colorectal cancer, you may need to be screened more often.  Skin Cancer  Check your skin from head to toe regularly.  Monitor any moles. Be sure to tell your health care provider: ? About any new moles or changes in moles, especially if there is a change in a mole's shape or color. ? If you have a mole that is larger than the size of a pencil eraser.  If any of your family members has a history of skin cancer, especially at a young age, talk with your health care provider about genetic screening.  Always use sunscreen. Apply sunscreen liberally and repeatedly throughout the day.  Whenever you are outside, protect yourself by wearing long sleeves, pants, a wide-brimmed hat, and sunglasses.  What should I know about osteoporosis? Osteoporosis is a condition in which bone destruction happens more quickly than new bone creation. After menopause, you may be at an increased risk for osteoporosis. To help prevent osteoporosis or the bone fractures that can happen because of osteoporosis, the following is recommended:  If you are 53-17 years old, get at least 1,000 mg of calcium and at least 600 mg of vitamin D per day.  If you are older than age 53 but younger than age 63, get at least 1,200 mg of calcium and at least 600 mg of vitamin D per day.  If you are older than age 70, get at least 1,200 mg of calcium and at least 800 mg of vitamin D per day.  Smoking and excessive alcohol intake increase the risk of osteoporosis. Eat foods that are rich in calcium and vitamin D, and do weight-bearing exercises several times  each week as directed by your health care provider. What should I know about how menopause affects my mental health? Depression may occur at any age, but it is more common as you become older. Common symptoms of depression include:  Low or sad mood.  Changes in sleep patterns.  Changes in appetite or eating patterns.  Feeling an overall lack of motivation or enjoyment of activities that you previously enjoyed.  Frequent crying spells.  Talk with your health care provider if you think that you are experiencing depression. What should I know about immunizations? It is important that you get and maintain your immunizations. These include:  Tetanus, diphtheria, and pertussis (Tdap) booster vaccine.  Influenza every year before the flu season begins.  Pneumonia  vaccine.  Shingles vaccine.  Your health care provider may also recommend other immunizations. This information is not intended to replace advice given to you by your health care provider. Make sure you discuss any questions you have with your health care provider. Document Released: 02/06/2006 Document Revised: 07/04/2016 Document Reviewed: 09/18/2015 Elsevier Interactive Patient Education  2018 [employer].

## 2017-08-25 NOTE — Progress Notes (Signed)
Subjective:    Patient ID: [name], female    DOB: 1936-11-21, 81 y.o.   MRN: [id number]  HPI  81 year old patient who is seen today for a preventive health examination. She continues to do well.  She does have a history of chronic anxiety.  She does have annual gynecologic evaluations.  Past Medical History:  Diagnosis Date  . Anxiety state    unspecified  . ARTHRITIS 09/03/2010  . Asthma   . CHOLELITHIASIS 09/03/2010  . COLONIC POLYPS, HX OF 06/02/2008  . DIVERTICULAR DISEASE 09/03/2010  . DYSPHAGIA UNSPECIFIED 09/03/2010  . ESSENTIAL HYPERTENSION 10/29/2010  . Fibromyalgia   . GALLSTONE PANCREATITIS 09/03/2010  . GERD 09/02/2007  . HEART MURMUR, HX OF 08/12/2010  . Hematuria   . Hiatal hernia   . Hx of colonoscopy    2014  . HYPERLIPIDEMIA 12/09/2007  . Insomnia   . Lichen simplex chronicus   . Low back pain    chronic  . Osteopenia   . OSTEOPOROSIS 05/17/2009  . Palpitations 09/03/2010   event monitor reveals long RP tachycardia, likely sinus tachycardia  . Paresthesia   . Polyuria   . RBBB 09/05/2010  . VENTRICULAR HYPERTROPHY, LEFT 08/12/2010  . VITAMIN B12 DEFICIENCY 09/02/2007  . WEIGHT LOSS, ABNORMAL 06/02/2008     Social History   Social History  . Marital status: Widowed    Spouse name: N/A  . Number of children: 6  . Years of education: N/A   Occupational History  . retired    Social History Main Topics  . Smoking status: Never Smoker  . Smokeless tobacco: Never Used  . Alcohol use No  . Drug use: No  . Sexual activity: No   Other Topics Concern  . Not on file   Social History Narrative   Widowed   2 children   Lives in [place]   Retired from [employer]    Past Surgical History:  Procedure Laterality Date  . ABDOMINAL HYSTERECTOMY     THA and O  . APPENDECTOMY    . CHOLECYSTECTOMY    . ESOPHAGOGASTRODUODENOSCOPY     with HH and esophagitis  . OOPHORECTOMY    . SPINE SURGERY     cervical fusion    Family History  Problem Relation Age of  Onset  . Heart disease Mother   . Stroke Mother   . Prostate cancer Father     Allergies  Allergen Reactions  . Celebrex [Celecoxib] Rash    REACTION: rash    Current Outpatient Prescriptions on File Prior to Visit  Medication Sig Dispense Refill  . aspirin 81 MG tablet Take 81 mg by mouth daily.    . Calcium Carbonate-Vitamin D (CALTRATE 600+D) 600-400 MG-UNIT per chew tablet Chew 1 tablet by mouth 2 (two) times daily.      . Cyanocobalamin (VITAMIN B 12 PO) Take 100 mcg by mouth daily.    . fluticasone (FLOVENT HFA) 110 MCG/ACT inhaler Inhale 2 puffs into the lungs 2 (two) times daily as needed (for wheezing & SOB). PATIENT STATES USES PRN    . meloxicam (MOBIC) 7.5 MG tablet Take 1 tablet (7.5 mg total) by mouth daily. 90 tablet 1  . metoprolol tartrate (LOPRESSOR) 25 MG tablet Take 1 tablet (25 mg total) by mouth 2 (two) times daily. 180 tablet 3  . Multiple Vitamin (MULTIVITAMIN) tablet Take 1 tablet by mouth daily.      . sertraline (ZOLOFT) 25 MG tablet Take 1 tablet (25 mg  total) by mouth daily. 90 tablet 3   No current facility-administered medications on file prior to visit.     BP (!) 112/58 (BP Location: Left Arm, Patient Position: Sitting, Cuff Size: Normal)   Pulse (!) 57   Temp 97.8 F (36.6 C) (Oral)   Ht 5\' 6"  (1.676 m)   Wt 118 lb 3.2 oz (53.6 kg)   SpO2 98%   BMI 19.08 kg/m    Medicare wellness visit  1. Risk factors, based on past  M,S,F history.  Contrast risk factors include history of impaired glucose tolerance.  2.  Physical activities:no activity restrictions.  Patient does walk 2-3 times per week  3.  Depression/mood:patient has chronic anxiety but no major depression  4.  Hearing:no deficits  5.  ADL's:independent  6.  Fall risk:low  7.  Home safety:no problems identified  8.  Height weight, and visual acuity;height and weight stable no change in visual acuity  9.  Counseling: continue heart healthy diet.  More rigorous activity  recommended  10. Lab orders based on risk factors:laboratory update will be reviewed including hemoglobin A1c  11. Referral :patient has had recent mammogram.  Colonoscopy 2014  12. Care plan:continue efforts at aggressive risk factor modification  13. Cognitive assessment: alert in order with normal affect no cognitive dysfunction  14. Screening: Patient provided with a written and personalized 5-10 year screening schedule in the AVS.    15. Provider List Update: primary care OB/GYN, cardiology, endocrinology and pulmonary medicine     Review of Systems  Constitutional: Negative.   HENT: Negative for congestion, dental problem, hearing loss, rhinorrhea, sinus pressure, sore throat and tinnitus.   Eyes: Negative for pain, discharge and visual disturbance.  Respiratory: Negative for cough and shortness of breath.   Cardiovascular: Negative for chest pain, palpitations and leg swelling.  Gastrointestinal: Negative for abdominal distention, abdominal pain, blood in stool, constipation, diarrhea, nausea and vomiting.  Genitourinary: Negative for difficulty urinating, dysuria, flank pain, frequency, hematuria, pelvic pain, urgency, vaginal bleeding, vaginal discharge and vaginal pain.  Musculoskeletal: Negative for arthralgias, gait problem and joint swelling.  Skin: Negative for rash.  Neurological: Negative for dizziness, syncope, speech difficulty, weakness, numbness and headaches.  Hematological: Negative for adenopathy.  Psychiatric/Behavioral: Negative for agitation, behavioral problems and dysphoric mood. The patient is nervous/anxious.        Objective:   Physical Exam  Constitutional: She is oriented to person, place, and time. She appears well-developed and well-nourished.  HENT:  Head: Normocephalic and atraumatic.  Right Ear: External ear normal.  Left Ear: External ear normal.  Mouth/Throat: Oropharynx is clear and moist.  Eyes: Conjunctivae and EOM are normal.    Neck: Normal range of motion. Neck supple. No JVD present. No thyromegaly present.  Cardiovascular: Normal rate, regular rhythm, normal heart sounds and intact distal pulses.   No murmur heard. Pulmonary/Chest: Effort normal and breath sounds normal. She has no wheezes. She has no rales.  Abdominal: Soft. Bowel sounds are normal. She exhibits no distension and no mass. There is no tenderness. There is no rebound and no guarding.  Musculoskeletal: Normal range of motion. She exhibits no edema or tenderness.  Neurological: She is alert and oriented to person, place, and time. She has normal reflexes. No cranial nerve deficit. She exhibits normal muscle tone. Coordination normal.  Skin: Skin is warm and dry. No rash noted.  Psychiatric: She has a normal mood and affect. Her behavior is normal.  Assessment & Plan:   Preventive health examination Social Medicare wellness visit Anxiety disorder.  We'll continue sertraline and when necessary alprazolam Impaired glucose tolerance.  We'll review a hemoglobin A1c  Follow-up one year or as needed  [employer]

## 2017-09-23 ENCOUNTER — Ambulatory Visit: Payer: Medicare Other

## 2017-09-24 ENCOUNTER — Ambulatory Visit (INDEPENDENT_AMBULATORY_CARE_PROVIDER_SITE_OTHER): Payer: Medicare Other | Admitting: *Deleted

## 2017-09-24 DIAGNOSIS — Z23 Encounter for immunization: Secondary | ICD-10-CM | POA: Diagnosis not present

## 2017-10-01 ENCOUNTER — Other Ambulatory Visit: Payer: Self-pay | Admitting: Internal Medicine

## 2017-11-13 ENCOUNTER — Other Ambulatory Visit: Payer: Self-pay | Admitting: Internal Medicine

## 2017-11-30 DIAGNOSIS — H524 Presbyopia: Secondary | ICD-10-CM | POA: Diagnosis not present

## 2017-11-30 DIAGNOSIS — H2513 Age-related nuclear cataract, bilateral: Secondary | ICD-10-CM | POA: Diagnosis not present

## 2017-11-30 DIAGNOSIS — H25013 Cortical age-related cataract, bilateral: Secondary | ICD-10-CM | POA: Diagnosis not present

## 2017-12-24 ENCOUNTER — Other Ambulatory Visit: Payer: Self-pay | Admitting: Internal Medicine

## 2018-02-03 ENCOUNTER — Other Ambulatory Visit: Payer: Self-pay | Admitting: Internal Medicine

## 2018-02-03 ENCOUNTER — Encounter: Payer: Self-pay | Admitting: Cardiovascular Disease

## 2018-02-04 NOTE — Progress Notes (Signed)
Patient ID: [name], female   DOB: February 15, 1936, 82 y.o.   MRN: [id number] [name] is seen today for f/U of palpitations She has had daily palptations when initially seen 2011 . No previous heart history. ? History of murmur. Indicates they are not related to exertion. Made worse by caffeine. [facility] with RBBB. She feels more jittery in general and may be having an anxiety state. Reviewed lab work form 8/15 and TSH was normal. No SSCP dyspnea or syncope. Palpitatons can last minutes and occure daily.  Monitor subsequently documented long R-P tachycardia and patient seen by Dr Rayann Heman . Recommended continued beta blocker Rx  No significant palpitations  Taking lopressor bid    Seeing Pulmonary for loculated left pleural effusion and 44mm lung nodule.  F/U CT September 2016 improved after 3 weeks Augmentin Rx  IMPRESSION: 1. Interval resolution of the inflammatory or infectious nodule in superior segment of the LEFT lower lobe. 2. Near complete resolution of the loculated pleural fluid collection in the lower LEFT hemi thorax.  Widowed for 18 years but worries about her kids/grand kids   Seldom gets palpitations on her lopressor   ROS: Denies fever, malais, weight loss, blurry vision, decreased visual acuity, cough, sputum, SOB, hemoptysis, pleuritic pain, palpitaitons, heartburn, abdominal pain, melena, lower extremity edema, claudication, or rash.  All other systems reviewed and negative  General: Anxious black female  Healthy:  appears younger than stated age 1: normal Neck supple with no adenopathy JVP normal no bruits no thyromegaly Lungs clear with no wheezing and good diaphragmatic motion Heart:  S1/S2 SEM  murmur, no rub, gallop or click PMI normal Abdomen: benighn, BS positve, no tenderness, no AAA no bruit.  No HSM or HJR Distal pulses intact with no bruits No edema Neuro non-focal Skin warm and dry No muscular weakness    Current Outpatient  Medications  Medication Sig Dispense Refill  . ALPRAZolam (XANAX) 1 MG tablet TAKE 1 TABLET BY MOUTH TWICE DAILY 60 tablet 0  . aspirin 81 MG tablet Take 81 mg by mouth daily.    . Calcium Carbonate-Vitamin D (CALTRATE 600+D) 600-400 MG-UNIT per chew tablet Chew 1 tablet by mouth 2 (two) times daily.      . Cyanocobalamin (VITAMIN B 12 PO) Take 100 mcg by mouth daily.    . fluticasone (FLOVENT HFA) 110 MCG/ACT inhaler Inhale 2 puffs into the lungs 2 (two) times daily as needed (for wheezing & SOB). PATIENT STATES USES PRN    . meloxicam (MOBIC) 7.5 MG tablet Take 1 tablet (7.5 mg total) by mouth daily. 90 tablet 1  . metoprolol tartrate (LOPRESSOR) 25 MG tablet Take 1 tablet (25 mg total) by mouth 2 (two) times daily. 180 tablet 3  . Multiple Vitamin (MULTIVITAMIN) tablet Take 1 tablet by mouth daily.      . sertraline (ZOLOFT) 25 MG tablet Take 1 tablet (25 mg total) by mouth daily. 90 tablet 3   No current facility-administered medications for this visit.     Allergies  Celebrex [celecoxib]  Electrocardiogram:   06/11/15 SR rate 80 RBBB ? Limb lead reversal  02/24/17  SR rate 58 RBBB LAFB LVH 02/11/18 DT TNNN no change   Assessment and Plan  SVT stable on beta blocker no need to entertain ablation at this point in time continue low dose beta blocker   Pulm:  quiescent CT  2016 with improved inflammatory changes   Anxiety: now on zoloft and xanax  f/u  Dr Raliegh Ip   Murmur:   Benign SEM AV sclerosis no need for echo   [name]

## 2018-02-08 NOTE — Telephone Encounter (Signed)
Copied from [place]. Topic: Quick Communication - Rx Refill/Question >> Feb 08, 2018 12:26 PM Bennye Alm wrote: Medication: Alprazolam Has the patient contacted their pharmacy? Yes. Per [facility], waiting for the provider to send in refill.  (Agent: If no, request that the patient contact the pharmacy for the refill.) Preferred Pharmacy (with phone number or street name): *[facility] in [place]  Patient would like to go pick up refill sometime today after 3pm if possible. Please advise.  Agent: Please be advised that RX refills may take up to 3 business days. We ask that you follow-up with your pharmacy.

## 2018-02-08 NOTE — Telephone Encounter (Signed)
Xanax refill request  Controlled substance Last OV 08/25/17 [id number], [place]

## 2018-02-10 ENCOUNTER — Other Ambulatory Visit: Payer: Self-pay | Admitting: Internal Medicine

## 2018-02-10 NOTE — Telephone Encounter (Signed)
Medication faxed in.

## 2018-02-11 ENCOUNTER — Encounter: Payer: Self-pay | Admitting: Cardiovascular Disease

## 2018-02-11 ENCOUNTER — Ambulatory Visit (INDEPENDENT_AMBULATORY_CARE_PROVIDER_SITE_OTHER): Payer: Medicare Other | Admitting: Cardiovascular Disease

## 2018-02-11 VITALS — BP 130/66 | HR 62 | Ht 66.0 in | Wt 129.0 lb

## 2018-02-11 DIAGNOSIS — I471 Supraventricular tachycardia: Secondary | ICD-10-CM | POA: Diagnosis not present

## 2018-02-11 DIAGNOSIS — R011 Cardiac murmur, unspecified: Secondary | ICD-10-CM | POA: Diagnosis not present

## 2018-02-11 DIAGNOSIS — R002 Palpitations: Secondary | ICD-10-CM

## 2018-02-11 NOTE — Patient Instructions (Addendum)

## 2018-02-25 ENCOUNTER — Other Ambulatory Visit: Payer: Self-pay | Admitting: Internal Medicine

## 2018-03-08 ENCOUNTER — Other Ambulatory Visit: Payer: Self-pay | Admitting: Cardiovascular Disease

## 2018-03-08 MED ORDER — METOPROLOL TARTRATE 25 MG PO TABS: 25.0000 mg | ORAL_TABLET | Freq: Two times a day (BID) | ORAL | 3 refills | Status: DC

## 2018-03-18 ENCOUNTER — Other Ambulatory Visit: Payer: Self-pay | Admitting: Internal Medicine

## 2018-03-29 ENCOUNTER — Other Ambulatory Visit: Payer: Self-pay | Admitting: Surgery

## 2018-03-29 DIAGNOSIS — E042 Nontoxic multinodular goiter: Secondary | ICD-10-CM

## 2018-03-30 ENCOUNTER — Other Ambulatory Visit: Payer: Self-pay | Admitting: Internal Medicine

## 2018-03-30 DIAGNOSIS — Z1231 Encounter for screening mammogram for malignant neoplasm of breast: Secondary | ICD-10-CM

## 2018-04-26 ENCOUNTER — Other Ambulatory Visit: Payer: Self-pay | Admitting: Internal Medicine

## 2018-04-26 NOTE — Telephone Encounter (Signed)
Okay for refill?  

## 2018-05-19 ENCOUNTER — Encounter: Payer: Self-pay | Admitting: Internal Medicine

## 2018-05-19 ENCOUNTER — Ambulatory Visit (INDEPENDENT_AMBULATORY_CARE_PROVIDER_SITE_OTHER): Payer: Medicare Other | Admitting: Internal Medicine

## 2018-05-19 VITALS — BP 100/64 | HR 68 | Temp 97.6°F | Wt 130.0 lb

## 2018-05-19 DIAGNOSIS — E089 Diabetes mellitus due to underlying condition without complications: Secondary | ICD-10-CM

## 2018-05-19 DIAGNOSIS — R1013 Epigastric pain: Secondary | ICD-10-CM | POA: Diagnosis not present

## 2018-05-19 MED ORDER — ESOMEPRAZOLE MAGNESIUM 40 MG PO CPDR: 40.0000 mg | DELAYED_RELEASE_CAPSULE | Freq: Every day | ORAL | 3 refills | Status: DC

## 2018-05-19 NOTE — Progress Notes (Signed)
Subjective:    Patient ID: [name], female    DOB: 1936/11/13, 82 y.o.   MRN: [id number]  HPI 82 year old patient who presents with a 4-week history of some GI distress.  She describes mainly a sense of gaseousness following meals.  After breakfast she also states that she must soon evacuate her bowels.  No nausea vomiting or weight loss. She has tried OTC Nexium 20 mg daily and this has been helpful. She has a history of palpitations and avoids caffeinated beverages.  Wt Readings from Last 3 Encounters:  05/19/18 130 lb (59 kg)  02/11/18 129 lb (58.5 kg)  08/25/17 118 lb 3.2 oz (53.6 kg)    Past Medical History:  Diagnosis Date  . Anxiety state    unspecified  . ARTHRITIS 09/03/2010  . Asthma   . CHOLELITHIASIS 09/03/2010  . COLONIC POLYPS, HX OF 06/02/2008  . DIVERTICULAR DISEASE 09/03/2010  . DYSPHAGIA UNSPECIFIED 09/03/2010  . ESSENTIAL HYPERTENSION 10/29/2010  . Fibromyalgia   . GALLSTONE PANCREATITIS 09/03/2010  . GERD 09/02/2007  . HEART MURMUR, HX OF 08/12/2010  . Hematuria   . Hiatal hernia   . Hx of colonoscopy    2014  . HYPERLIPIDEMIA 12/09/2007  . Insomnia   . Lichen simplex chronicus   . Low back pain    chronic  . Osteopenia   . OSTEOPOROSIS 05/17/2009  . Palpitations 09/03/2010   event monitor reveals long RP tachycardia, likely sinus tachycardia  . Paresthesia   . Polyuria   . RBBB 09/05/2010  . VENTRICULAR HYPERTROPHY, LEFT 08/12/2010  . VITAMIN B12 DEFICIENCY 09/02/2007  . WEIGHT LOSS, ABNORMAL 06/02/2008     Social History   Socioeconomic History  . Marital status: Widowed    Spouse name: Not on file  . Number of children: 6  . Years of education: Not on file  . Highest education level: Not on file  Occupational History  . Occupation: retired  Scientific laboratory technician  . Financial resource strain: Not on file  . Food insecurity:    Worry: Not on file    Inability: Not on file  . Transportation needs:    Medical: Not on file    Non-medical: Not on file    Tobacco Use  . Smoking status: Never Smoker  . Smokeless tobacco: Never Used  Substance and Sexual Activity  . Alcohol use: No    Alcohol/week: 0.0 oz  . Drug use: No  . Sexual activity: Never  Lifestyle  . Physical activity:    Days per week: Not on file    Minutes per session: Not on file  . Stress: Not on file  Relationships  . Social connections:    Talks on phone: Not on file    Gets together: Not on file    Attends religious service: Not on file    Active member of club or organization: Not on file    Attends meetings of clubs or organizations: Not on file    Relationship status: Not on file  . Intimate partner violence:    Fear of current or ex partner: Not on file    Emotionally abused: Not on file    Physically abused: Not on file    Forced sexual activity: Not on file  Other Topics Concern  . Not on file  Social History Narrative   Widowed   2 children   Lives in [place]   Retired from [employer]    Past Surgical History:  Procedure Laterality Date  .  ABDOMINAL HYSTERECTOMY     THA and O  . APPENDECTOMY    . CHOLECYSTECTOMY    . ESOPHAGOGASTRODUODENOSCOPY     with HH and esophagitis  . OOPHORECTOMY    . SPINE SURGERY     cervical fusion    Family History  Problem Relation Age of Onset  . Heart disease Mother   . Stroke Mother   . Prostate cancer Father     Allergies  Allergen Reactions  . Celebrex [Celecoxib] Rash    REACTION: rash    Current Outpatient Medications on File Prior to Visit  Medication Sig Dispense Refill  . ALPRAZolam (XANAX) 1 MG tablet TAKE 1 TABLET BY MOUTH TWICE DAILY 60 tablet 0  . aspirin 81 MG tablet Take 81 mg by mouth daily.    . Calcium Carbonate-Vitamin D (CALTRATE 600+D) 600-400 MG-UNIT per chew tablet Chew 1 tablet by mouth 2 (two) times daily.      . Cyanocobalamin (VITAMIN B 12 PO) Take 100 mcg by mouth daily.    . fluticasone (FLOVENT HFA) 110 MCG/ACT inhaler Inhale 2 puffs into the lungs 2 (two) times  daily as needed (for wheezing & SOB). PATIENT STATES USES PRN    . meloxicam (MOBIC) 7.5 MG tablet Take 1 tablet (7.5 mg total) by mouth daily. 90 tablet 1  . metoprolol tartrate (LOPRESSOR) 25 MG tablet Take 1 tablet (25 mg total) by mouth 2 (two) times daily. 180 tablet 3  . Multiple Vitamin (MULTIVITAMIN) tablet Take 1 tablet by mouth daily.      . sertraline (ZOLOFT) 25 MG tablet TAKE 1 TABLET BY MOUTH ONCE DAILY 90 tablet 3   No current facility-administered medications on file prior to visit.     BP 100/64   Pulse 68   Temp 97.6 F (36.4 C) (Oral)   Wt 130 lb (59 kg)   SpO2 95%   BMI 20.98 kg/m   \   Review of Systems  Constitutional: Negative.   HENT: Negative for congestion, dental problem, hearing loss, rhinorrhea, sinus pressure, sore throat and tinnitus.   Eyes: Negative for pain, discharge and visual disturbance.  Respiratory: Negative for cough and shortness of breath.   Cardiovascular: Negative for chest pain, palpitations and leg swelling.  Gastrointestinal: Negative for abdominal distention, abdominal pain, blood in stool, constipation, diarrhea, nausea and vomiting.       Gaseousness  Genitourinary: Negative for difficulty urinating, dysuria, flank pain, frequency, hematuria, pelvic pain, urgency, vaginal bleeding, vaginal discharge and vaginal pain.  Musculoskeletal: Negative for arthralgias, gait problem and joint swelling.  Skin: Negative for rash.  Neurological: Negative for dizziness, syncope, speech difficulty, weakness, numbness and headaches.  Hematological: Negative for adenopathy.  Psychiatric/Behavioral: Negative for agitation, behavioral problems and dysphoric mood. The patient is not nervous/anxious.        Objective:   Physical Exam  Constitutional: She is oriented to person, place, and time. She appears well-developed and well-nourished.  HENT:  Head: Normocephalic.  Right Ear: External ear normal.  Left Ear: External ear normal.    Mouth/Throat: Oropharynx is clear and moist.  Eyes: Pupils are equal, round, and reactive to light. Conjunctivae and EOM are normal.  Neck: Normal range of motion. Neck supple. No thyromegaly present.  Cardiovascular: Normal rate, regular rhythm, normal heart sounds and intact distal pulses.  Pulmonary/Chest: Effort normal and breath sounds normal.  Abdominal: Soft. Bowel sounds are normal. She exhibits no distension and no mass. There is no tenderness. There is no rebound and  no guarding.  Musculoskeletal: Normal range of motion.  Lymphadenopathy:    She has no cervical adenopathy.  Neurological: She is alert and oriented to person, place, and time.  Skin: Skin is warm and dry. No rash noted.  Psychiatric: She has a normal mood and affect. Her behavior is normal.          Assessment & Plan:   Dyspepsia.  Will place on a antireflux regimen.  Dietary information dispensed.  For the short-term will increase Nexium to 40 mg daily Patient will call if unimproved  Return for annual exam as scheduled  Nyoka Cowden

## 2018-05-19 NOTE — Patient Instructions (Addendum)
Avoids foods high in acid such as tomatoes citrus juices, and spicy foods.  Avoid eating within two hours of lying down or before exercising.  Do not overheat.  Try smaller more frequent meals.  If symptoms persist, elevate the head of her bed 12 inches while sleeping.\\   Food Choices for Gastroesophageal Reflux Disease, Adult When you have gastroesophageal reflux disease (GERD), the foods you eat and your eating habits are very important. Choosing the right foods can help ease the discomfort of GERD. Consider working with a diet and nutrition specialist (dietitian) to help you make healthy food choices. What general guidelines should I follow? Eating plan  Choose healthy foods low in fat, such as fruits, vegetables, whole grains, low-fat dairy products, and lean meat, fish, and poultry.  Eat frequent, small meals instead of three large meals each day. Eat your meals slowly, in a relaxed setting. Avoid bending over or lying down until 2-3 hours after eating.  Limit high-fat foods such as fatty meats or fried foods.  Limit your intake of oils, butter, and shortening to less than 8 teaspoons each day.  Avoid the following: ? Foods that cause symptoms. These may be different for different people. Keep a food diary to keep track of foods that cause symptoms. ? Alcohol. ? Drinking large amounts of liquid with meals. ? Eating meals during the 2-3 hours before bed.  Cook foods using methods other than frying. This may include baking, grilling, or broiling. Lifestyle   Maintain a healthy weight. Ask your health care provider what weight is healthy for you. If you need to lose weight, work with your health care provider to do so safely.  Exercise for at least 30 minutes on 5 or more days each week, or as told by your health care provider.  Avoid wearing clothes that fit tightly around your waist and chest.  Do not use any products that contain nicotine or tobacco, such as cigarettes and  e-cigarettes. If you need help quitting, ask your health care provider.  Sleep with the head of your bed raised. Use a wedge under the mattress or blocks under the bed frame to raise the head of the bed. What foods are not recommended? The items listed may not be a complete list. Talk with your dietitian about what dietary choices are best for you. Grains Pastries or quick breads with added fat. [place] toast. Vegetables Deep fried vegetables. [place] fries. Any vegetables prepared with added fat. Any vegetables that cause symptoms. For some people this may include tomatoes and tomato products, chili peppers, onions and garlic, and horseradish. Fruits Any fruits prepared with added fat. Any fruits that cause symptoms. For some people this may include citrus fruits, such as oranges, grapefruit, pineapple, and lemons. Meats and other protein foods High-fat meats, such as fatty beef or pork, hot dogs, ribs, ham, sausage, salami and bacon. Fried meat or protein, including fried fish and fried chicken. Nuts and nut butters. Dairy Whole milk and chocolate milk. Sour cream. Cream. Ice cream. Cream cheese. Milk shakes. Beverages Coffee and tea, with or without caffeine. Carbonated beverages. Sodas. Energy drinks. Fruit juice made with acidic fruits (such as orange or grapefruit). Tomato juice. Alcoholic drinks. Fats and oils Butter. Margarine. Shortening. Ghee. Sweets and desserts Chocolate and cocoa. Donuts. Seasoning and other foods Pepper. Peppermint and spearmint. Any condiments, herbs, or seasonings that cause symptoms. For some people, this may include curry, hot sauce, or vinegar-based salad dressings. Summary  When you have gastroesophageal  reflux disease (GERD), food and lifestyle choices are very important to help ease the discomfort of GERD.  Eat frequent, small meals instead of three large meals each day. Eat your meals slowly, in a relaxed setting. Avoid bending over or lying down until  2-3 hours after eating.  Limit high-fat foods such as fatty meat or fried foods. This information is not intended to replace advice given to you by your health care provider. Make sure you discuss any questions you have with your health care provider. Document Released: 12/15/2005 Document Revised: 12/16/2016 Document Reviewed: 12/16/2016 Elsevier Interactive Patient Education  2018 [place]. Lactose-Free Diet, Adult If you have lactose intolerance, you are not able to digest lactose. Lactose is a natural sugar found mainly in milk and milk products. You may need to avoid all foods and beverages that contain lactose. A lactose-free diet can help you do this. What do I need to know about this diet?  Do not consume foods, beverages, vitamins, minerals, or medicines with lactose. Read ingredients lists carefully.  Look for the words "lactose-free" on labels.  Use lactase enzyme drops or tablets as directed by your health care provider.  Use lactose-free milk or a milk alternative, such as soy milk, for drinking and cooking.  Make sure you get enough calcium and vitamin D in your diet. A lactose-free eating plan can be lacking in these important nutrients.  Take calcium and vitamin D supplements as directed by your health care provider. Talk to your provider about supplements if you are not able to get enough calcium and vitamin D from food. Which foods have lactose? Lactose is found in:  Milk and foods made from milk.  Yogurt.  Cheese.  Butter.  Margarine.  Sour cream.  Cream.  Whipped toppings and nondairy creamers.  Ice cream and other milk-based desserts.  Lactose is also found in foods or products made with milk or milk ingredients. To find out whether a food contains milk or a milk ingredient, look at the ingredients list. Avoid foods with the statement "May contain milk" and foods that contain:  Butter.  Cream.  Milk.  Milk solids.  Milk  powder.  Whey.  Curd.  Caseinate.  Lactose.  Lactalbumin.  Lactoglobulin.  What are some alternatives to milk and foods made with milk products?  Lactose-free milk.  Soy milk with added calcium and vitamin D.  Almond, coconut, or rice milk with added calcium and vitamin D. Note that these are low in protein.  Soy products, such as soy yogurt, soy cheese, soy ice cream, and soy-based sour cream. Which foods can I eat? Grains Breads and rolls made without milk, such as [place], [place], or [place] bread, bagels, pita, and [employer]. Corn tortillas, corn meal, grits, and polenta. Crackers without lactose or milk solids, such as soda crackers and graham crackers. Cooked or dry cereals without lactose or milk solids. Pasta, quinoa, couscous, barley, oats, bulgur, farro, rice, wild rice, or other grains prepared without milk or lactose. Plain popcorn. Vegetables Fresh, frozen, and canned vegetables without cheese, cream, or butter sauces. Fruits All fresh, canned, frozen, or dried fruits that are not processed with lactose. Meats and Other Protein Sources Plain beef, chicken, fish, [place], lamb, veal, pork, wild game, or ham. Kosher-prepared meat products. Strained or junior meats that do not contain milk. Eggs. Soy meat substitutes. Beans, lentils, and hummus. Tofu. Nuts and seeds. Peanut or other nut butters without lactose. Soups, casseroles, and mixed dishes without cheese, cream, or milk.  Dairy Lactose-free milk. Soy, rice, or almond milk with added calcium and vitamin D. Soy cheese and yogurt. Beverages Carbonated drinks. Tea. Coffee, freeze-dried coffee, and some instant coffees. Fruit and vegetable juices. Condiments Soy sauce. Carob powder. Olives. Gravy made with water. Baker's cocoa. Angie Fava. Pure seasonings and spices. Ketchup. Mustard. Bouillon. Broth. Sweets and Desserts Water and fruit ices. Gelatin. Cookies, pies, or cakes made from allowed ingredients, such as angel food  cake. Pudding made with water or a milk substitute. Lactose-free tofu desserts. Soy, coconut milk, or rice-milk-based frozen desserts. Sugar. Honey. Jam, jelly, and marmalade. Molasses. Pure sugar candy. Dark chocolate without milk. Marshmallows. Fats and Oils Margarines and salad dressings that do not contain milk. Berniece Salines. Vegetable oils. Shortening. Mayonnaise. Soy or coconut-based cream. The items listed above may not be a complete list of recommended foods or beverages. Contact your dietitian for more options. Which foods are not recommended? Grains Breads and rolls that contain milk. Toaster pastries. Muffins, biscuits, waffles, cornbread, and pancakes. These can be prepared at home, commercial, or from mixes. Sweet rolls, donuts, English muffins, fry bread, lefse, flour tortillas with lactose, or [place] toast made with milk or milk ingredients. Crackers that contain lactose. Corn curls. Cooked or dry cereals with lactose. Vegetables Creamed or breaded vegetables. Vegetables in a cheese or butter sauce or with lactose-containing margarines. Instant potatoes. [place] fries. Scalloped or au gratin potatoes. Fruits None. Meats and Other Protein Sources Scrambled eggs, omelets, and souffles that contain milk. Creamed or breaded meat, fish, chicken, or [place]. Sausage products, such as wieners and liver sausage. Cold cuts that contain milk solids. Cheese, cottage cheese, ricotta cheese, and cheese spreads. Lasagna and macaroni and cheese. Pizza. Peanut or other nut butters with added milk solids. Casseroles or mixed dishes containing milk or cheese. Dairy All dairy products, including milk, goat's milk, buttermilk, kefir, acidophilus milk, flavored milk, evaporated milk, condensed milk, dulce de [place], eggnog, yogurt, cheese, and cheese spreads. Beverages Hot chocolate. Cocoa with lactose. Instant iced teas. Powdered fruit drinks. Smoothies made with milk or yogurt. Condiments Chewing gum that has  lactose. Cocoa that has lactose. Spice blends if they contain milk products. Artificial sweeteners that contain lactose. Nondairy creamers. Sweets and Desserts Ice cream, ice milk, gelato, sherbet, and frozen yogurt. Custard, pudding, and mousse. Cake, cream pies, cookies, and other desserts containing milk, cream, cream cheese, or milk chocolate. Pie crust made with milk-containing margarine or butter. Reduced-calorie desserts made with a sugar substitute that contains lactose. Toffee and butterscotch. Milk, white, or dark chocolate that contains milk. Fudge. Caramel. Fats and Oils Margarines and salad dressings that contain milk or cheese. Cream. Half and half. Cream cheese. Sour cream. Chip dips made with sour cream or yogurt. The items listed above may not be a complete list of foods and beverages to avoid. Contact your dietitian for more information. Am I getting enough calcium? Calcium is found in many foods that contain lactose and is important for bone health. The amount of calcium you need depends on your age:  Adults younger than 50 years: 1000 mg of calcium a day.  Adults older than 50 years: 1200 mg of calcium a day.  If you are not getting enough calcium, other calcium sources include:  Orange juice with calcium added. There are 300-350 mg of calcium in 1 cup of orange juice.  Sardines with edible bones. There are 325 mg of calcium in 3 oz of sardines.  Calcium-fortified soy milk. There are 300-400 mg of calcium in  1 cup of calcium-fortified soy milk.  Calcium-fortified rice or almond milk. There are 300 mg of calcium in 1 cup of calcium-fortified rice or almond milk.  Canned salmon with edible bones. There are 180 mg of calcium in 3 oz of canned salmon with edible bones.  Calcium-fortified breakfast cereals. There are [phone] mg of calcium in calcium-fortified breakfast cereals.  Tofu set with calcium sulfate. There are 250 mg of calcium in  cup of tofu set with calcium  sulfate.  Spinach, cooked. There are 145 mg of calcium in  cup of cooked spinach.  Edamame, cooked. There are 130 mg of calcium in  cup of cooked edamame.  Collard greens, cooked. There are 125 mg of calcium in  cup of cooked collard greens.  Kale, frozen or cooked. There are 90 mg of calcium in  cup of cooked or frozen kale.  Almonds. There are 95 mg of calcium in  cup of almonds.  Broccoli, cooked. There are 60 mg of calcium in 1 cup of cooked broccoli.  This information is not intended to replace advice given to you by your health care provider. Make sure you discuss any questions you have with your health care provider. Document Released: 06/06/2002 Document Revised: 05/22/2016 Document Reviewed: 03/17/2014 Elsevier Interactive Patient Education  2018 [employer].

## 2018-05-25 DIAGNOSIS — M25579 Pain in unspecified ankle and joints of unspecified foot: Secondary | ICD-10-CM | POA: Diagnosis not present

## 2018-05-25 DIAGNOSIS — I739 Peripheral vascular disease, unspecified: Secondary | ICD-10-CM | POA: Diagnosis not present

## 2018-05-25 DIAGNOSIS — M79671 Pain in right foot: Secondary | ICD-10-CM | POA: Diagnosis not present

## 2018-05-25 DIAGNOSIS — M79672 Pain in left foot: Secondary | ICD-10-CM | POA: Diagnosis not present

## 2018-06-07 ENCOUNTER — Other Ambulatory Visit: Payer: Self-pay | Admitting: Internal Medicine

## 2018-06-08 ENCOUNTER — Ambulatory Visit
Admission: RE | Admit: 2018-06-08 | Discharge: 2018-06-08 | Disposition: A | Discharge status: Home / Self Care | Payer: Medicare Other | Source: Ambulatory Visit | Attending: Internal Medicine | Admitting: Internal Medicine

## 2018-06-08 DIAGNOSIS — Z1231 Encounter for screening mammogram for malignant neoplasm of breast: Secondary | ICD-10-CM | POA: Diagnosis not present

## 2018-06-08 DIAGNOSIS — Z01419 Encounter for gynecological examination (general) (routine) without abnormal findings: Secondary | ICD-10-CM | POA: Diagnosis not present

## 2018-06-08 DIAGNOSIS — Z6822 Body mass index (BMI) 22.0-22.9, adult: Secondary | ICD-10-CM | POA: Diagnosis not present

## 2018-06-09 NOTE — Telephone Encounter (Signed)
Okay for refill? Please advise 

## 2018-06-09 NOTE — Telephone Encounter (Signed)
OK for RF 

## 2018-06-10 ENCOUNTER — Other Ambulatory Visit: Payer: Self-pay | Admitting: Internal Medicine

## 2018-06-17 ENCOUNTER — Ambulatory Visit
Admission: RE | Admit: 2018-06-17 | Discharge: 2018-06-17 | Disposition: A | Discharge status: Home / Self Care | Payer: Medicare Other | Source: Ambulatory Visit | Attending: Surgery | Admitting: Surgery

## 2018-06-17 DIAGNOSIS — E042 Nontoxic multinodular goiter: Secondary | ICD-10-CM

## 2018-06-17 DIAGNOSIS — E041 Nontoxic single thyroid nodule: Secondary | ICD-10-CM | POA: Diagnosis not present

## 2018-07-13 ENCOUNTER — Other Ambulatory Visit: Payer: Self-pay | Admitting: Internal Medicine

## 2018-07-16 ENCOUNTER — Telehealth: Payer: Self-pay | Admitting: Internal Medicine

## 2018-07-19 NOTE — Telephone Encounter (Signed)
Patient states she did not fill this on 7/16 for ALPRAZolam Duanne Moron) 1 MG tablet

## 2018-07-19 NOTE — Telephone Encounter (Addendum)
Last fill 07/13/18

## 2018-07-20 MED ORDER — ALPRAZOLAM 0.5 MG PO TABS: ORAL_TABLET | ORAL | 0 refills | Status: DC

## 2018-07-20 NOTE — Addendum Note (Signed)
Addended by: Rebecca Eaton on: 07/20/2018 04:03 PM   Modules accepted: Orders

## 2018-07-20 NOTE — Telephone Encounter (Signed)
Called Pharmacy and confirmed that they do not have th Rx from 07/13/18 on file. Called Patient and confirmed that she does not have a hard copy and that the Rx was faxed to the pharmacy.  Ok to resend?

## 2018-07-20 NOTE — Telephone Encounter (Signed)
Original Rx was 1mg  tabs, Dr. Burnice Logan suggested 1/2 tab BID  Rx sent for 0.5mg  tabs, 1 tab twice a day for patient compliance purposes

## 2018-07-20 NOTE — Telephone Encounter (Signed)
Okay to resend.  Suggest dose reduction of 1/2 tablet twice daily as needed

## 2018-07-23 NOTE — Telephone Encounter (Signed)
Pt is calling and pharm can not read the rx for alprazolam please resend

## 2018-09-02 ENCOUNTER — Encounter: Payer: Medicare Other | Admitting: Internal Medicine

## 2018-09-03 ENCOUNTER — Other Ambulatory Visit: Payer: Self-pay | Admitting: Internal Medicine

## 2018-09-09 ENCOUNTER — Ambulatory Visit (INDEPENDENT_AMBULATORY_CARE_PROVIDER_SITE_OTHER): Payer: Medicare Other | Admitting: Internal Medicine

## 2018-09-09 ENCOUNTER — Encounter: Payer: Self-pay | Admitting: Internal Medicine

## 2018-09-09 VITALS — BP 100/60 | HR 52 | Temp 97.7°F | Ht 65.0 in | Wt 130.0 lb

## 2018-09-09 DIAGNOSIS — R634 Abnormal weight loss: Secondary | ICD-10-CM

## 2018-09-09 DIAGNOSIS — E7800 Pure hypercholesterolemia, unspecified: Secondary | ICD-10-CM | POA: Diagnosis not present

## 2018-09-09 DIAGNOSIS — Z23 Encounter for immunization: Secondary | ICD-10-CM

## 2018-09-09 DIAGNOSIS — E538 Deficiency of other specified B group vitamins: Secondary | ICD-10-CM

## 2018-09-09 DIAGNOSIS — I10 Essential (primary) hypertension: Secondary | ICD-10-CM

## 2018-09-09 DIAGNOSIS — E089 Diabetes mellitus due to underlying condition without complications: Secondary | ICD-10-CM

## 2018-09-09 DIAGNOSIS — K219 Gastro-esophageal reflux disease without esophagitis: Secondary | ICD-10-CM | POA: Diagnosis not present

## 2018-09-09 DIAGNOSIS — F411 Generalized anxiety disorder: Secondary | ICD-10-CM

## 2018-09-09 DIAGNOSIS — E119 Type 2 diabetes mellitus without complications: Secondary | ICD-10-CM

## 2018-09-09 LAB — CBC WITH DIFFERENTIAL/PLATELET
BASOS PCT: 0.9 % (ref 0.0–3.0)
Basophils Absolute: 0 10*3/uL (ref 0.0–0.1)
Eosinophils Absolute: 0.4 10*3/uL (ref 0.0–0.7)
Eosinophils Relative: 9.7 % — ABNORMAL HIGH (ref 0.0–5.0)
HEMATOCRIT: 37.9 % (ref 36.0–46.0)
HEMOGLOBIN: 12.8 g/dL (ref 12.0–15.0)
LYMPHS PCT: 38.4 % (ref 12.0–46.0)
Lymphs Abs: 1.8 10*3/uL (ref 0.7–4.0)
MCHC: 33.7 g/dL (ref 30.0–36.0)
MCV: 88.1 fl (ref 78.0–100.0)
MONO ABS: 0.4 10*3/uL (ref 0.1–1.0)
Monocytes Relative: 8.6 % (ref 3.0–12.0)
Neutro Abs: 2 10*3/uL (ref 1.4–7.7)
Neutrophils Relative %: 42.4 % — ABNORMAL LOW (ref 43.0–77.0)
Platelets: 247 10*3/uL (ref 150.0–400.0)
RBC: 4.3 Mil/uL (ref 3.87–5.11)
RDW: 13.8 % (ref 11.5–15.5)
WBC: 4.6 10*3/uL (ref 4.0–10.5)

## 2018-09-09 LAB — COMPREHENSIVE METABOLIC PANEL
ALBUMIN: 3.9 g/dL (ref 3.5–5.2)
ALT: 16 U/L (ref 0–35)
AST: 13 U/L (ref 0–37)
Alkaline Phosphatase: 107 U/L (ref 39–117)
BUN: 13 mg/dL (ref 6–23)
CHLORIDE: 102 meq/L (ref 96–112)
CO2: 29 mEq/L (ref 19–32)
Calcium: 9.1 mg/dL (ref 8.4–10.5)
Creatinine, Ser: 0.58 mg/dL (ref 0.40–1.20)
GFR: 128.43 mL/min (ref >60.00)
Glucose, Bld: 89 mg/dL (ref 70–99)
POTASSIUM: 3.5 meq/L (ref 3.5–5.1)
SODIUM: 138 meq/L (ref 135–145)
Total Bilirubin: 0.8 mg/dL (ref 0.2–1.2)
Total Protein: 6.7 g/dL (ref 6.0–8.3)

## 2018-09-09 LAB — LIPID PANEL
CHOLESTEROL: 149 mg/dL (ref 0–200)
HDL: 55.9 mg/dL (ref >39.00)
LDL CALC: 80 mg/dL (ref 0–99)
NonHDL: 93.58
Total CHOL/HDL Ratio: 3
Triglycerides: 69 mg/dL (ref 0.0–149.0)
VLDL: 13.8 mg/dL (ref 0.0–40.0)

## 2018-09-09 LAB — TSH: TSH: 1.2 u[IU]/mL (ref 0.35–4.50)

## 2018-09-09 LAB — POCT GLYCOSYLATED HEMOGLOBIN (HGB A1C): HEMOGLOBIN A1C: 5.6 % (ref 4.0–5.6)

## 2018-09-09 LAB — VITAMIN B12: Vitamin B-12: 943 pg/mL — ABNORMAL HIGH (ref 211–911)

## 2018-09-09 MED ORDER — ALPRAZOLAM 0.5 MG PO TABS: ORAL_TABLET | ORAL | 0 refills | Status: DC

## 2018-09-09 MED ORDER — ESOMEPRAZOLE MAGNESIUM 40 MG PO CPDR: 40.0000 mg | DELAYED_RELEASE_CAPSULE | Freq: Every day | ORAL | 3 refills | Status: DC

## 2018-09-09 MED ORDER — METOPROLOL TARTRATE 25 MG PO TABS: 25.0000 mg | ORAL_TABLET | Freq: Two times a day (BID) | ORAL | 3 refills | Status: DC

## 2018-09-09 MED ORDER — SERTRALINE HCL 25 MG PO TABS: 25.0000 mg | ORAL_TABLET | Freq: Every day | ORAL | 3 refills | Status: DC

## 2018-09-09 NOTE — Patient Instructions (Signed)
Limit your sodium (Salt) intake    It is important that you exercise regularly, at least 20 minutes 3 to 4 times per week.  If you develop chest pain or shortness of breath seek  medical attention.  Return in 6 months for follow-up  

## 2018-09-09 NOTE — Progress Notes (Signed)
Subjective:    Patient ID: [name], female    DOB: 12/23/1936, 82 y.o.   MRN: [id number]  HPI  82 year old patient who is seen today for follow-up.  She has a history of essential hypertension additionally has a history of B12 deficiency.  She states her granddaughter has been concerned about possible dementia.  Patient states that her granddaughters other grandmother had dementia.  Patient denies any issues with memory. Patient does have a prior history of diabetes presently controlled with diet only. No cardiopulmonary complaints  She does have a history of anxiety and is asking why her alprazolam has been down titrated Patient has had an eye exam within the past year  mmse 27/30  Past Medical History:  Diagnosis Date  . Anxiety state    unspecified  . ARTHRITIS 09/03/2010  . Asthma   . CHOLELITHIASIS 09/03/2010  . COLONIC POLYPS, HX OF 06/02/2008  . DIVERTICULAR DISEASE 09/03/2010  . DYSPHAGIA UNSPECIFIED 09/03/2010  . ESSENTIAL HYPERTENSION 10/29/2010  . Fibromyalgia   . GALLSTONE PANCREATITIS 09/03/2010  . GERD 09/02/2007  . HEART MURMUR, HX OF 08/12/2010  . Hematuria   . Hiatal hernia   . Hx of colonoscopy    2014  . HYPERLIPIDEMIA 12/09/2007  . Insomnia   . Lichen simplex chronicus   . Low back pain    chronic  . Osteopenia   . OSTEOPOROSIS 05/17/2009  . Palpitations 09/03/2010   event monitor reveals long RP tachycardia, likely sinus tachycardia  . Paresthesia   . Polyuria   . RBBB 09/05/2010  . VENTRICULAR HYPERTROPHY, LEFT 08/12/2010  . VITAMIN B12 DEFICIENCY 09/02/2007  . WEIGHT LOSS, ABNORMAL 06/02/2008     Social History   Socioeconomic History  . Marital status: Widowed    Spouse name: Not on file  . Number of children: 6  . Years of education: Not on file  . Highest education level: Not on file  Occupational History  . Occupation: retired  Scientific laboratory technician  . Financial resource strain: Not on file  . Food insecurity:    Worry: Not on file    Inability: Not on  file  . Transportation needs:    Medical: Not on file    Non-medical: Not on file  Tobacco Use  . Smoking status: Never Smoker  . Smokeless tobacco: Never Used  Substance and Sexual Activity  . Alcohol use: No    Alcohol/week: 0.0 standard drinks  . Drug use: No  . Sexual activity: Never  Lifestyle  . Physical activity:    Days per week: Not on file    Minutes per session: Not on file  . Stress: Not on file  Relationships  . Social connections:    Talks on phone: Not on file    Gets together: Not on file    Attends religious service: Not on file    Active member of club or organization: Not on file    Attends meetings of clubs or organizations: Not on file    Relationship status: Not on file  . Intimate partner violence:    Fear of current or ex partner: Not on file    Emotionally abused: Not on file    Physically abused: Not on file    Forced sexual activity: Not on file  Other Topics Concern  . Not on file  Social History Narrative   Widowed   2 children   Lives in [place]   Retired from [employer]    Past Surgical  History:  Procedure Laterality Date  . ABDOMINAL HYSTERECTOMY     THA and O  . APPENDECTOMY    . CHOLECYSTECTOMY    . ESOPHAGOGASTRODUODENOSCOPY     with HH and esophagitis  . OOPHORECTOMY    . SPINE SURGERY     cervical fusion    Family History  Problem Relation Age of Onset  . Heart disease Mother   . Stroke Mother   . Prostate cancer Father     Allergies  Allergen Reactions  . Celebrex [Celecoxib] Rash    REACTION: rash    Current Outpatient Medications on File Prior to Visit  Medication Sig Dispense Refill  . ALPRAZolam (XANAX) 0.5 MG tablet TAKE 1 TABLET BY MOUTH TWICE DAILY AS NEEDED 60 tablet 0  . aspirin 81 MG tablet Take 81 mg by mouth daily.    . Calcium Carbonate-Vitamin D (CALTRATE 600+D) 600-400 MG-UNIT per chew tablet Chew 1 tablet by mouth 2 (two) times daily.      . Cyanocobalamin (VITAMIN B 12 PO) Take 100 mcg by  mouth daily.    Marland Kitchen esomeprazole (NEXIUM) 40 MG capsule Take 1 capsule (40 mg total) by mouth daily at 12 noon. 30 capsule 3  . fluticasone (FLOVENT HFA) 110 MCG/ACT inhaler Inhale 2 puffs into the lungs 2 (two) times daily as needed (for wheezing & SOB). PATIENT STATES USES PRN    . meloxicam (MOBIC) 7.5 MG tablet Take 1 tablet (7.5 mg total) by mouth daily. 90 tablet 1  . metoprolol tartrate (LOPRESSOR) 25 MG tablet Take 1 tablet (25 mg total) by mouth 2 (two) times daily. 180 tablet 3  . Multiple Vitamin (MULTIVITAMIN) tablet Take 1 tablet by mouth daily.      . sertraline (ZOLOFT) 25 MG tablet TAKE 1 TABLET BY MOUTH ONCE DAILY 90 tablet 3   No current facility-administered medications on file prior to visit.     BP 100/60 (BP Location: Right Arm, Patient Position: Sitting, Cuff Size: Normal)   Pulse (!) 52   Temp 97.7 F (36.5 C) (Oral)   Ht 5\' 5"  (1.651 m)   Wt 130 lb (59 kg)   SpO2 99%   BMI 21.63 kg/m     Review of Systems  Constitutional: Negative.   HENT: Negative for congestion, dental problem, hearing loss, rhinorrhea, sinus pressure, sore throat and tinnitus.   Eyes: Negative for pain, discharge and visual disturbance.  Respiratory: Negative for cough and shortness of breath.   Cardiovascular: Negative for chest pain, palpitations and leg swelling.  Gastrointestinal: Positive for abdominal distention. Negative for abdominal pain, blood in stool, constipation, diarrhea, nausea and vomiting.  Genitourinary: Negative for difficulty urinating, dysuria, flank pain, frequency, hematuria, pelvic pain, urgency, vaginal bleeding, vaginal discharge and vaginal pain.  Musculoskeletal: Negative for arthralgias, gait problem and joint swelling.  Skin: Negative for rash.  Neurological: Negative for dizziness, syncope, speech difficulty, weakness, numbness and headaches.  Hematological: Negative for adenopathy.  Psychiatric/Behavioral: Negative for agitation, behavioral problems and  dysphoric mood. The patient is nervous/anxious.        Objective:   Physical Exam  Constitutional: She is oriented to person, place, and time. She appears well-developed and well-nourished.  HENT:  Head: Normocephalic.  Right Ear: External ear normal.  Left Ear: External ear normal.  Mouth/Throat: Oropharynx is clear and moist.  Eyes: Pupils are equal, round, and reactive to light. Conjunctivae and EOM are normal.  Neck: Normal range of motion. Neck supple. No thyromegaly present.  Cardiovascular: Normal  rate, regular rhythm, normal heart sounds and intact distal pulses.  Pulmonary/Chest: Effort normal and breath sounds normal.  Abdominal: Soft. Bowel sounds are normal. She exhibits no mass. There is no tenderness.  Musculoskeletal: Normal range of motion.  Lymphadenopathy:    She has no cervical adenopathy.  Neurological: She is alert and oriented to person, place, and time.  Skin: Skin is warm and dry. No rash noted.  Psychiatric: She has a normal mood and affect. Her behavior is normal.          Assessment & Plan:   Diet-controlled diabetes.  Hemoglobin A1c normal Essential hypertension stable History of B12 deficiency.  Will check a B12 level Generalized anxiety disorder History of gastroesophageal reflux disease  Review updated lab Follow-up 6 months Flu vaccine administered  Marletta Lor

## 2018-11-11 ENCOUNTER — Encounter: Payer: Self-pay | Admitting: Family Medicine

## 2018-11-11 ENCOUNTER — Ambulatory Visit (INDEPENDENT_AMBULATORY_CARE_PROVIDER_SITE_OTHER): Payer: Medicare Other | Admitting: Family Medicine

## 2018-11-11 VITALS — BP 120/50 | HR 49 | Temp 98.0°F | Wt 132.0 lb

## 2018-11-11 DIAGNOSIS — F411 Generalized anxiety disorder: Secondary | ICD-10-CM | POA: Diagnosis not present

## 2018-11-11 MED ORDER — ALPRAZOLAM 1 MG PO TABS: 1.0000 mg | ORAL_TABLET | Freq: Two times a day (BID) | ORAL | 5 refills | Status: DC | PRN

## 2018-11-11 NOTE — Progress Notes (Signed)
   Subjective:    Patient ID: [name], female    DOB: 11-Jun-1936, 82 y.o.   MRN: [id number]  HPI Here to follow up on anxiety. She has been on Xanax for a number of years, and she took 1 mg BID for a long time. Earlier this year here previous PCP, Dr. Burnice Logan, reduced this dose to 0.5 mg BID and ever since she has felt more anxious and she tends to worry about her family. She also takes 25 mg of Zoloft daily. She sometimes has trouble sleeping. She denies having depression symptoms.    Review of Systems  Constitutional: Negative.   Respiratory: Negative.   Cardiovascular: Negative.   Neurological: Negative.   Psychiatric/Behavioral: Positive for sleep disturbance. Negative for agitation, confusion, dysphoric mood, hallucinations, self-injury and suicidal ideas. The patient is nervous/anxious.        Objective:   Physical Exam  Constitutional: She is oriented to person, place, and time. She appears well-developed and well-nourished.  Cardiovascular: Normal rate, regular rhythm, normal heart sounds and intact distal pulses.  Pulmonary/Chest: Effort normal and breath sounds normal.  Neurological: She is alert and oriented to person, place, and time.  Psychiatric: She has a normal mood and affect. Her behavior is normal. Thought content normal.          Assessment & Plan:  Anxiety, we will increase the Xanax back to 1 mg BID. Stay on the Zoloft as it is. Recheck prn Alysia Penna, MD

## 2019-01-18 DIAGNOSIS — H52203 Unspecified astigmatism, bilateral: Secondary | ICD-10-CM | POA: Diagnosis not present

## 2019-01-18 DIAGNOSIS — H524 Presbyopia: Secondary | ICD-10-CM | POA: Diagnosis not present

## 2019-01-18 DIAGNOSIS — H25013 Cortical age-related cataract, bilateral: Secondary | ICD-10-CM | POA: Diagnosis not present

## 2019-01-18 DIAGNOSIS — H2513 Age-related nuclear cataract, bilateral: Secondary | ICD-10-CM | POA: Diagnosis not present

## 2019-01-26 DIAGNOSIS — H25012 Cortical age-related cataract, left eye: Secondary | ICD-10-CM | POA: Diagnosis not present

## 2019-01-26 DIAGNOSIS — H25011 Cortical age-related cataract, right eye: Secondary | ICD-10-CM | POA: Diagnosis not present

## 2019-01-26 DIAGNOSIS — H2511 Age-related nuclear cataract, right eye: Secondary | ICD-10-CM | POA: Diagnosis not present

## 2019-01-26 DIAGNOSIS — H2512 Age-related nuclear cataract, left eye: Secondary | ICD-10-CM | POA: Diagnosis not present

## 2019-02-16 DIAGNOSIS — H2512 Age-related nuclear cataract, left eye: Secondary | ICD-10-CM | POA: Diagnosis not present

## 2019-02-16 DIAGNOSIS — H25012 Cortical age-related cataract, left eye: Secondary | ICD-10-CM | POA: Diagnosis not present

## 2019-02-16 NOTE — Progress Notes (Signed)
Patient ID: [name], female   DOB: Apr 20, 1936, 83 y.o.   MRN: [id number]     [name] is seen today for f/U of palpitations She has had daily palptations when initially seen 2011 . No previous heart history. ? History of murmur. Indicates they are not related to exertion. Made worse by caffeine. ECG 07/1511 [facility] NSR with RBBB. She feels more jittery in general and may be having an anxiety state. Reviewed lab work form 8/15 and TSH was normal. No SSCP dyspnea or syncope. Palpitatons can last minutes and occure daily.  Monitor subsequently documented long R-P tachycardia and patient seen by Dr Rayann Heman . Recommended continued beta blocker Rx  No significant palpitations  Taking lopressor bid    Sees Pulmonary for loculated left pleural effusion and 46mm lung nodule.  F/U CT September 2016 improved after 3 weeks Augmentin Rx  IMPRESSION: 1. Interval resolution of the inflammatory or infectious nodule in superior segment of the LEFT lower lobe. 2. Near complete resolution of the loculated pleural fluid collection in the lower LEFT hemi thorax.  Widowed for 18 years but worries about her kids/grand kids   RX anxiety with xanax and Zoloft  Some arthritis in her hands   ROS: Denies fever, malais, weight loss, blurry vision, decreased visual acuity, cough, sputum, SOB, hemoptysis, pleuritic pain, palpitaitons, heartburn, abdominal pain, melena, lower extremity edema, claudication, or rash.  All other systems reviewed and negative  General: Anxious black female  Healthy:  appears younger than stated age 71: normal Neck supple with no adenopathy JVP normal no bruits no thyromegaly Lungs clear with no wheezing and good diaphragmatic motion Heart:  S1/S2 SEM  murmur, no rub, gallop or click PMI normal Abdomen: benighn, BS positve, no tenderness, no AAA no bruit.  No HSM or HJR Distal pulses intact with no bruits No edema Neuro non-focal Skin warm and dry No muscular  weakness    Current Outpatient Medications  Medication Sig Dispense Refill  . ALPRAZolam (XANAX) 1 MG tablet Take 1 tablet (1 mg total) by mouth 2 (two) times daily as needed for anxiety. 60 tablet 5  . aspirin 81 MG tablet Take 81 mg by mouth daily.    . Calcium Carbonate-Vitamin D (CALTRATE 600+D) 600-400 MG-UNIT per chew tablet Chew 1 tablet by mouth 2 (two) times daily.      . Cyanocobalamin (VITAMIN B 12 PO) Take 100 mcg by mouth daily.    Marland Kitchen esomeprazole (NEXIUM) 40 MG capsule Take 1 capsule (40 mg total) by mouth daily at 12 noon. 30 capsule 3  . fluticasone (FLOVENT HFA) 110 MCG/ACT inhaler Inhale 2 puffs into the lungs 2 (two) times daily as needed (for wheezing & SOB). PATIENT STATES USES PRN    . meloxicam (MOBIC) 7.5 MG tablet Take 1 tablet (7.5 mg total) by mouth daily. 90 tablet 1  . metoprolol tartrate (LOPRESSOR) 25 MG tablet Take 1 tablet (25 mg total) by mouth 2 (two) times daily. 180 tablet 3  . Multiple Vitamin (MULTIVITAMIN) tablet Take 1 tablet by mouth daily.      . sertraline (ZOLOFT) 25 MG tablet Take 1 tablet (25 mg total) by mouth daily. 90 tablet 3   No current facility-administered medications for this visit.     Allergies  Celebrex [celecoxib]  Electrocardiogram:   06/11/15 SR rate 80 RBBB ? Limb lead reversal  02/24/17  SR rate 58 RBBB LAFB LVH 02/11/18 DT TNNN no change   Assessment and Plan  SVT  stable on beta blocker no need to entertain ablation at this point in time continue low dose beta blocker   Pulm:  quiescent CT  2016 with improved inflammatory changes   Anxiety: now on zoloft and xanax  f/u Dr Raliegh Ip   Murmur:   Benign SEM AV sclerosis no need for echo   [name]

## 2019-02-21 ENCOUNTER — Encounter: Payer: Self-pay | Admitting: Cardiovascular Disease

## 2019-02-21 ENCOUNTER — Ambulatory Visit (INDEPENDENT_AMBULATORY_CARE_PROVIDER_SITE_OTHER): Payer: Medicare Other | Admitting: Cardiovascular Disease

## 2019-02-21 VITALS — BP 116/62 | HR 64 | Ht 65.0 in | Wt 138.4 lb

## 2019-02-21 DIAGNOSIS — I471 Supraventricular tachycardia, unspecified: Secondary | ICD-10-CM

## 2019-02-21 DIAGNOSIS — R011 Cardiac murmur, unspecified: Secondary | ICD-10-CM | POA: Diagnosis not present

## 2019-02-21 NOTE — Patient Instructions (Addendum)

## 2019-03-09 ENCOUNTER — Telehealth: Payer: Self-pay

## 2019-03-09 NOTE — Telephone Encounter (Signed)
Author phoned pt. to offer to see for subsequent awv prior to or after [facility] appointment with [facility]. Automated VM. Author to speak to pt. In person upon her arrival for 3/12 appointment to offer to schedule awv.

## 2019-03-10 ENCOUNTER — Ambulatory Visit (INDEPENDENT_AMBULATORY_CARE_PROVIDER_SITE_OTHER): Payer: Medicare Other | Admitting: Adult Health

## 2019-03-10 ENCOUNTER — Other Ambulatory Visit: Payer: Self-pay

## 2019-03-10 ENCOUNTER — Encounter: Payer: Self-pay | Admitting: Adult Health

## 2019-03-10 ENCOUNTER — Encounter: Payer: Medicare Other | Admitting: Adult Health

## 2019-03-10 VITALS — BP 112/54 | Temp 97.6°F | Wt 138.0 lb

## 2019-03-10 DIAGNOSIS — Z7689 Persons encountering health services in other specified circumstances: Secondary | ICD-10-CM | POA: Diagnosis not present

## 2019-03-10 DIAGNOSIS — F5101 Primary insomnia: Secondary | ICD-10-CM | POA: Diagnosis not present

## 2019-03-10 DIAGNOSIS — F411 Generalized anxiety disorder: Secondary | ICD-10-CM | POA: Diagnosis not present

## 2019-03-10 DIAGNOSIS — K219 Gastro-esophageal reflux disease without esophagitis: Secondary | ICD-10-CM

## 2019-03-10 DIAGNOSIS — I471 Supraventricular tachycardia: Secondary | ICD-10-CM

## 2019-03-10 MED ORDER — SERTRALINE HCL 50 MG PO TABS: 50.0000 mg | ORAL_TABLET | Freq: Every day | ORAL | 1 refills | Status: DC

## 2019-03-10 NOTE — Progress Notes (Signed)
Patient presents to clinic today to establish care. She is a pleasant 83 year old female who  has a past medical history of Anxiety state, ARTHRITIS (09/03/2010), Asthma, CHOLELITHIASIS (09/03/2010), COLONIC POLYPS, HX OF (06/02/2008), DIVERTICULAR DISEASE (09/03/2010), DYSPHAGIA UNSPECIFIED (09/03/2010), ESSENTIAL HYPERTENSION (10/29/2010), Fibromyalgia, GALLSTONE PANCREATITIS (09/03/2010), GERD (09/02/2007), HEART MURMUR, HX OF (08/12/2010), Hematuria, Hiatal hernia, colonoscopy, HYPERLIPIDEMIA (06/21/7627), Insomnia, Lichen simplex chronicus, Low back pain, Osteopenia, OSTEOPOROSIS (05/17/2009), Palpitations (09/03/2010), Paresthesia, Polyuria, RBBB (09/05/2010), VENTRICULAR HYPERTROPHY, LEFT (08/12/2010), VITAMIN B12 DEFICIENCY (09/02/2007), and WEIGHT LOSS, ABNORMAL (06/02/2008).  She is a former patient of Dr. Raliegh Ip   Her last CPE was in September 2019   Acute Concerns: Establish care   Chronic Issues: Anxiety - takes Zoloft 25 mg tablets.  She feels as though her anxiety is not well controlled.  GERD - controlled with Nexium   Osteoarthritis - low back and bilateral hands . Does not feel as though she needs medication for the pain.   Insomnia - takes Xanax PRN.   SVT -beta-blocker.  Is followed by cardiology on a yearly basis.  Denies any recent feelings of palpitations or fast heart rate  Health Maintenance: Dental -- Routine Care Vision -- Routine Care  Immunizations -- UTD  Colonoscopy --No longer needs Mammogram -- No longer needs  PAP -- no longer needs  Bone Density -- 2017-showed low bone mineral density  Treatment Team  - Cardiology    Past Medical History:  Diagnosis Date  . Anxiety state    unspecified  . ARTHRITIS 09/03/2010  . Asthma   . CHOLELITHIASIS 09/03/2010  . COLONIC POLYPS, HX OF 06/02/2008  . DIVERTICULAR DISEASE 09/03/2010  . DYSPHAGIA UNSPECIFIED 09/03/2010  . ESSENTIAL HYPERTENSION 10/29/2010  . Fibromyalgia   . GALLSTONE PANCREATITIS 09/03/2010  . GERD 09/02/2007  . HEART  MURMUR, HX OF 08/12/2010  . Hematuria   . Hiatal hernia   . Hx of colonoscopy    2014  . HYPERLIPIDEMIA 12/09/2007  . Insomnia   . Lichen simplex chronicus   . Low back pain    chronic  . Osteopenia   . OSTEOPOROSIS 05/17/2009  . Palpitations 09/03/2010   event monitor reveals long RP tachycardia, likely sinus tachycardia  . Paresthesia   . Polyuria   . RBBB 09/05/2010  . VENTRICULAR HYPERTROPHY, LEFT 08/12/2010  . VITAMIN B12 DEFICIENCY 09/02/2007  . WEIGHT LOSS, ABNORMAL 06/02/2008    Past Surgical History:  Procedure Laterality Date  . ABDOMINAL HYSTERECTOMY     THA and O  . APPENDECTOMY    . CHOLECYSTECTOMY    . ESOPHAGOGASTRODUODENOSCOPY     with HH and esophagitis  . OOPHORECTOMY    . SPINE SURGERY     cervical fusion    Current Outpatient Medications on File Prior to Visit  Medication Sig Dispense Refill  . ALPRAZolam (XANAX) 1 MG tablet Take 1 tablet (1 mg total) by mouth 2 (two) times daily as needed for anxiety. 60 tablet 5  . aspirin 81 MG tablet Take 81 mg by mouth daily.    . Calcium Carbonate-Vitamin D (CALTRATE 600+D) 600-400 MG-UNIT per chew tablet Chew 1 tablet by mouth 2 (two) times daily.      . Cyanocobalamin (VITAMIN B 12 PO) Take 100 mcg by mouth daily.    Marland Kitchen esomeprazole (NEXIUM) 40 MG capsule Take 1 capsule (40 mg total) by mouth daily at 12 noon. 30 capsule 3  . fluticasone (FLOVENT HFA) 110 MCG/ACT inhaler Inhale 2 puffs into the lungs 2 (two)  times daily as needed (for wheezing & SOB). PATIENT STATES USES PRN    . metoprolol tartrate (LOPRESSOR) 25 MG tablet Take 1 tablet (25 mg total) by mouth 2 (two) times daily. 180 tablet 3  . Multiple Vitamin (MULTIVITAMIN) tablet Take 1 tablet by mouth daily.      . sertraline (ZOLOFT) 25 MG tablet Take 1 tablet (25 mg total) by mouth daily. 90 tablet 3   No current facility-administered medications on file prior to visit.     Allergies  Allergen Reactions  . Celebrex [Celecoxib] Rash    REACTION: rash     Family History  Problem Relation Age of Onset  . Heart disease Mother   . Stroke Mother   . Prostate cancer Father     Social History   Socioeconomic History  . Marital status: Widowed    Spouse name: Not on file  . Number of children: 6  . Years of education: Not on file  . Highest education level: Not on file  Occupational History  . Occupation: retired  Scientific laboratory technician  . Financial resource strain: Not on file  . Food insecurity:    Worry: Not on file    Inability: Not on file  . Transportation needs:    Medical: Not on file    Non-medical: Not on file  Tobacco Use  . Smoking status: Never Smoker  . Smokeless tobacco: Never Used  Substance and Sexual Activity  . Alcohol use: No    Alcohol/week: 0.0 standard drinks  . Drug use: No  . Sexual activity: Never  Lifestyle  . Physical activity:    Days per week: Not on file    Minutes per session: Not on file  . Stress: Not on file  Relationships  . Social connections:    Talks on phone: Not on file    Gets together: Not on file    Attends religious service: Not on file    Active member of club or organization: Not on file    Attends meetings of clubs or organizations: Not on file    Relationship status: Not on file  . Intimate partner violence:    Fear of current or ex partner: Not on file    Emotionally abused: Not on file    Physically abused: Not on file    Forced sexual activity: Not on file  Other Topics Concern  . Not on file  Social History Narrative   Widowed   2 children   Lives in [place]   Retired from [employer]  Constitutional: Negative.   HENT: Negative.   Eyes: Negative.   Respiratory: Negative.   Cardiovascular: Negative.   Gastrointestinal: Negative.   Genitourinary: Negative.   Musculoskeletal: Positive for joint pain (Low back and bilateral hands).  Skin: Negative.   Neurological: Negative.   Psychiatric/Behavioral: The patient is nervous/anxious and has  insomnia.   All other systems reviewed and are negative.     BP (!) 112/54   Temp 97.6 F (36.4 C)   Wt 138 lb (62.6 kg)   BMI 22.96 kg/m   Physical Exam Vitals signs and nursing note reviewed.  Cardiovascular:     Rate and Rhythm: Normal rate and regular rhythm.     Pulses: Normal pulses.     Heart sounds: Normal heart sounds.  Pulmonary:     Effort: Pulmonary effort is normal.     Breath sounds: Normal breath sounds.  Musculoskeletal: Normal range of  motion.        General: No swelling, tenderness or deformity.  Skin:    General: Skin is warm and dry.     Capillary Refill: Capillary refill takes less than 2 seconds.  Neurological:     General: No focal deficit present.     Mental Status: She is oriented to person, place, and time.  Psychiatric:        Mood and Affect: Mood is anxious.        Behavior: Behavior normal.        Thought Content: Thought content normal.        Judgment: Judgment normal.     Assessment/Plan: 1. Encounter to establish care -Follow-up for CPE September -Follow up sooner if needed -Encouraged to continue to stay active and eat a heart healthy diet  2. Generalized anxiety disorder -Increase Zoloft from 25 mg to 50 mg to see if we can get a better control on her anxiety - sertraline (ZOLOFT) 50 MG tablet; Take 1 tablet (50 mg total) by mouth daily.  Dispense: 90 tablet; Refill: 1  3. Primary insomnia -Continue Xanax as needed  4. Gastroesophageal reflux disease without esophagitis -Continue with Nexium  Dorothyann Peng, NP

## 2019-03-10 NOTE — Patient Instructions (Addendum)
I am going to increase your Zoloft from 25 mg to 50 mg to see if we can get your anxiety better controlled.   Please let me know if this does not help with the anxiety   You will be do for your next physical in September

## 2019-03-13 ENCOUNTER — Other Ambulatory Visit: Payer: Self-pay | Admitting: Cardiovascular Disease

## 2019-03-13 DIAGNOSIS — I471 Supraventricular tachycardia: Secondary | ICD-10-CM | POA: Insufficient documentation

## 2019-03-17 NOTE — Telephone Encounter (Signed)
Author unable to ask pt. On 3/12. Current restrictions d/t COVID-19 in place. Will reach out to pt. To schedule non-essential visit once restrictions are lifted.

## 2019-05-03 ENCOUNTER — Other Ambulatory Visit: Payer: Self-pay | Admitting: Internal Medicine

## 2019-05-03 DIAGNOSIS — Z1231 Encounter for screening mammogram for malignant neoplasm of breast: Secondary | ICD-10-CM

## 2019-05-10 ENCOUNTER — Other Ambulatory Visit: Payer: Self-pay | Admitting: Family Medicine

## 2019-05-11 NOTE — Telephone Encounter (Signed)
Pt of CN

## 2019-05-16 DIAGNOSIS — Z961 Presence of intraocular lens: Secondary | ICD-10-CM | POA: Diagnosis not present

## 2019-06-13 ENCOUNTER — Other Ambulatory Visit: Payer: Self-pay | Admitting: Adult Health

## 2019-06-13 DIAGNOSIS — Z6823 Body mass index (BMI) 23.0-23.9, adult: Secondary | ICD-10-CM | POA: Diagnosis not present

## 2019-06-28 ENCOUNTER — Ambulatory Visit
Admission: RE | Admit: 2019-06-28 | Discharge: 2019-06-28 | Disposition: A | Discharge status: Home / Self Care | Payer: Medicare Other | Source: Ambulatory Visit | Attending: Internal Medicine | Admitting: Internal Medicine

## 2019-06-28 ENCOUNTER — Other Ambulatory Visit: Payer: Self-pay

## 2019-06-28 DIAGNOSIS — Z1231 Encounter for screening mammogram for malignant neoplasm of breast: Secondary | ICD-10-CM | POA: Diagnosis not present

## 2019-08-16 ENCOUNTER — Other Ambulatory Visit: Payer: Self-pay | Admitting: Adult Health

## 2019-09-20 ENCOUNTER — Other Ambulatory Visit: Payer: Self-pay

## 2019-09-20 ENCOUNTER — Encounter: Payer: Self-pay | Admitting: Adult Health

## 2019-09-20 ENCOUNTER — Ambulatory Visit (INDEPENDENT_AMBULATORY_CARE_PROVIDER_SITE_OTHER): Payer: Medicare Other | Admitting: Adult Health

## 2019-09-20 VITALS — BP 126/60 | HR 60 | Temp 97.3°F | Ht 65.55 in | Wt 143.2 lb

## 2019-09-20 DIAGNOSIS — I471 Supraventricular tachycardia: Secondary | ICD-10-CM | POA: Diagnosis not present

## 2019-09-20 DIAGNOSIS — F411 Generalized anxiety disorder: Secondary | ICD-10-CM

## 2019-09-20 DIAGNOSIS — F5101 Primary insomnia: Secondary | ICD-10-CM | POA: Diagnosis not present

## 2019-09-20 DIAGNOSIS — Z23 Encounter for immunization: Secondary | ICD-10-CM | POA: Diagnosis not present

## 2019-09-20 DIAGNOSIS — Z0000 Encounter for general adult medical examination without abnormal findings: Secondary | ICD-10-CM

## 2019-09-20 LAB — LIPID PANEL
Cholesterol: 163 mg/dL (ref 0–200)
HDL: 64.4 mg/dL (ref >39.00)
LDL Cholesterol: 86 mg/dL (ref 0–99)
NonHDL: 98.38
Total CHOL/HDL Ratio: 3
Triglycerides: 61 mg/dL (ref 0.0–149.0)
VLDL: 12.2 mg/dL (ref 0.0–40.0)

## 2019-09-20 LAB — CBC WITH DIFFERENTIAL/PLATELET
Basophils Absolute: 0 10*3/uL (ref 0.0–0.1)
Basophils Relative: 0.7 % (ref 0.0–3.0)
Eosinophils Absolute: 0.3 10*3/uL (ref 0.0–0.7)
Eosinophils Relative: 6.2 % — ABNORMAL HIGH (ref 0.0–5.0)
HCT: 38 % (ref 36.0–46.0)
Hemoglobin: 12.6 g/dL (ref 12.0–15.0)
Lymphocytes Relative: 34.7 % (ref 12.0–46.0)
Lymphs Abs: 1.8 10*3/uL (ref 0.7–4.0)
MCHC: 33.3 g/dL (ref 30.0–36.0)
MCV: 88.6 fl (ref 78.0–100.0)
Monocytes Absolute: 0.4 10*3/uL (ref 0.1–1.0)
Monocytes Relative: 8 % (ref 3.0–12.0)
Neutro Abs: 2.6 10*3/uL (ref 1.4–7.7)
Neutrophils Relative %: 50.4 % (ref 43.0–77.0)
Platelets: 242 10*3/uL (ref 150.0–400.0)
RBC: 4.29 Mil/uL (ref 3.87–5.11)
RDW: 14.5 % (ref 11.5–15.5)
WBC: 5.2 10*3/uL (ref 4.0–10.5)

## 2019-09-20 LAB — COMPREHENSIVE METABOLIC PANEL
ALT: 18 U/L (ref 0–35)
AST: 16 U/L (ref 0–37)
Albumin: 4 g/dL (ref 3.5–5.2)
Alkaline Phosphatase: 122 U/L — ABNORMAL HIGH (ref 39–117)
BUN: 11 mg/dL (ref 6–23)
CO2: 31 mEq/L (ref 19–32)
Calcium: 9.6 mg/dL (ref 8.4–10.5)
Chloride: 102 mEq/L (ref 96–112)
Creatinine, Ser: 0.48 mg/dL (ref 0.40–1.20)
GFR: 149.94 mL/min (ref >60.00)
Glucose, Bld: 93 mg/dL (ref 70–99)
Potassium: 3.6 mEq/L (ref 3.5–5.1)
Sodium: 139 mEq/L (ref 135–145)
Total Bilirubin: 0.7 mg/dL (ref 0.2–1.2)
Total Protein: 6.8 g/dL (ref 6.0–8.3)

## 2019-09-20 LAB — TSH: TSH: 0.98 u[IU]/mL (ref 0.35–4.50)

## 2019-09-20 MED ORDER — SERTRALINE HCL 100 MG PO TABS: 100.0000 mg | ORAL_TABLET | Freq: Every day | ORAL | 1 refills | Status: DC

## 2019-09-20 NOTE — Patient Instructions (Addendum)
It was great seeing you today   We will follow up with you regarding your blood work   I have increased Zoloft from 50 mg to 100 mg - take this daily. Please let me know if this does not help with your anxiety

## 2019-09-20 NOTE — Progress Notes (Signed)
Subjective:    Patient ID: [name], female    DOB: 07/01/1936, 83 y.o.   MRN: [id number]  HPI Patient presents for yearly preventative medicine examination. She is a pleasant 83 year old female who  has a past medical history of Anxiety state, ARTHRITIS (09/03/2010), Asthma, CHOLELITHIASIS (09/03/2010), COLONIC POLYPS, HX OF (06/02/2008), DIVERTICULAR DISEASE (09/03/2010), DYSPHAGIA UNSPECIFIED (09/03/2010), ESSENTIAL HYPERTENSION (10/29/2010), Fibromyalgia, GALLSTONE PANCREATITIS (09/03/2010), GERD (09/02/2007), HEART MURMUR, HX OF (08/12/2010), Hematuria, Hiatal hernia, colonoscopy, HYPERLIPIDEMIA (123456), Insomnia, Lichen simplex chronicus, Low back pain, Osteopenia, OSTEOPOROSIS (05/17/2009), Palpitations (09/03/2010), Paresthesia, Polyuria, RBBB (09/05/2010), VENTRICULAR HYPERTROPHY, LEFT (08/12/2010), VITAMIN B12 DEFICIENCY (09/02/2007), and WEIGHT LOSS, ABNORMAL (06/02/2008).  Anxiety/Depression - takes Zoloft 50 mg daily. She continues to have significant anxiety. Her anxiety stems from worrying about her kids and grandkids and having to pay bills. She would like to increase Zoloft.   GERD - controlled with Nexium   Osteoarthritis - mainly in the low back and bilateral hands  SVT - is followed by Cardiology yearly. Currently prescribed Lopressor BID. She denies palpations   Insomnia - takes Xanax as needed  All immunizations and health maintenance protocols were reviewed with the patient and needed orders were placed.She is due for flu vaccination.   Appropriate screening laboratory values were ordered for the patient including screening of hyperlipidemia, renal function and hepatic function.   Medication reconciliation,  past medical history, social history, problem list and allergies were reviewed in detail with the patient  Goals were established with regard to weight loss, exercise, and  diet in compliance with medications. She stays active and eats a heart healthy diet  End of life planning  was discussed. She has an advanced directive and living will    Review of Systems  Constitutional: Negative.   HENT: Negative.   Eyes: Negative.   Respiratory: Negative.   Cardiovascular: Negative.   Gastrointestinal: Negative.   Endocrine: Negative.   Genitourinary: Negative.   Musculoskeletal: Positive for arthralgias and back pain.  Skin: Negative.   Allergic/Immunologic: Negative.   Neurological: Negative.   Hematological: Negative.   Psychiatric/Behavioral: Positive for sleep disturbance. The patient is nervous/anxious.    Past Medical History:  Diagnosis Date  . Anxiety state    unspecified  . ARTHRITIS 09/03/2010  . Asthma   . CHOLELITHIASIS 09/03/2010  . COLONIC POLYPS, HX OF 06/02/2008  . DIVERTICULAR DISEASE 09/03/2010  . DYSPHAGIA UNSPECIFIED 09/03/2010  . ESSENTIAL HYPERTENSION 10/29/2010  . Fibromyalgia   . GALLSTONE PANCREATITIS 09/03/2010  . GERD 09/02/2007  . HEART MURMUR, HX OF 08/12/2010  . Hematuria   . Hiatal hernia   . Hx of colonoscopy    2014  . HYPERLIPIDEMIA 12/09/2007  . Insomnia   . Lichen simplex chronicus   . Low back pain    chronic  . Osteopenia   . OSTEOPOROSIS 05/17/2009  . Palpitations 09/03/2010   event monitor reveals long RP tachycardia, likely sinus tachycardia  . Paresthesia   . Polyuria   . RBBB 09/05/2010  . VENTRICULAR HYPERTROPHY, LEFT 08/12/2010  . VITAMIN B12 DEFICIENCY 09/02/2007  . WEIGHT LOSS, ABNORMAL 06/02/2008    Social History   Socioeconomic History  . Marital status: Widowed    Spouse name: Not on file  . Number of children: 6  . Years of education: Not on file  . Highest education level: Not on file  Occupational History  . Occupation: retired  Scientific laboratory technician  . Financial resource strain: Not on file  . Food insecurity  Worry: Not on file    Inability: Not on file  . Transportation needs    Medical: Not on file    Non-medical: Not on file  Tobacco Use  . Smoking status: Never Smoker  . Smokeless tobacco: Never  Used  Substance and Sexual Activity  . Alcohol use: No    Alcohol/week: 0.0 standard drinks  . Drug use: No  . Sexual activity: Never  Lifestyle  . Physical activity    Days per week: Not on file    Minutes per session: Not on file  . Stress: Not on file  Relationships  . Social Herbalist on phone: Not on file    Gets together: Not on file    Attends religious service: Not on file    Active member of club or organization: Not on file    Attends meetings of clubs or organizations: Not on file    Relationship status: Not on file  . Intimate partner violence    Fear of current or ex partner: Not on file    Emotionally abused: Not on file    Physically abused: Not on file    Forced sexual activity: Not on file  Other Topics Concern  . Not on file  Social History Narrative   Widowed   2 children   Lives in [place]   Retired from [employer]    Past Surgical History:  Procedure Laterality Date  . ABDOMINAL HYSTERECTOMY     THA and O  . APPENDECTOMY    . CHOLECYSTECTOMY    . ESOPHAGOGASTRODUODENOSCOPY     with HH and esophagitis  . OOPHORECTOMY    . SPINE SURGERY     cervical fusion    Family History  Problem Relation Age of Onset  . Heart disease Mother   . Stroke Mother   . Prostate cancer Father     Allergies  Allergen Reactions  . Celebrex [Celecoxib] Rash    REACTION: rash    Current Outpatient Medications on File Prior to Visit  Medication Sig Dispense Refill  . ALPRAZolam (XANAX) 1 MG tablet Take 1 tablet by mouth twice daily as needed for anxiety 60 tablet 1  . aspirin 81 MG tablet Take 81 mg by mouth daily.    . Calcium Carbonate-Vitamin D (CALTRATE 600+D) 600-400 MG-UNIT per chew tablet Chew 1 tablet by mouth 2 (two) times daily.      . Cyanocobalamin (VITAMIN B 12 PO) Take 100 mcg by mouth daily.    Marland Kitchen esomeprazole (NEXIUM) 40 MG capsule Take 1 capsule (40 mg total) by mouth daily at 12 noon. 30 capsule 3  . fluticasone (FLOVENT HFA)  110 MCG/ACT inhaler Inhale 2 puffs into the lungs 2 (two) times daily as needed (for wheezing & SOB). PATIENT STATES USES PRN    . metoprolol tartrate (LOPRESSOR) 25 MG tablet Take 1 tablet by mouth twice daily 180 tablet 0  . Multiple Vitamin (MULTIVITAMIN) tablet Take 1 tablet by mouth daily.       No current facility-administered medications on file prior to visit.     BP 126/60 (BP Location: Left Arm, Patient Position: Sitting, Cuff Size: Normal)   Pulse 60   Temp (!) 97.3 F (36.3 C) (Temporal)   Ht 5' 5.55" (1.665 m)   Wt 143 lb 3.2 oz (65 kg)   SpO2 100%   BMI 23.43 kg/m       Objective:   Physical Exam Vitals signs and nursing note  reviewed.  Constitutional:      General: She is not in acute distress.    Appearance: Normal appearance. She is well-developed. She is not ill-appearing, toxic-appearing or diaphoretic.  HENT:     Head: Normocephalic and atraumatic.     Right Ear: Tympanic membrane, ear canal and external ear normal. There is no impacted cerumen.     Left Ear: Tympanic membrane, ear canal and external ear normal. There is no impacted cerumen.     Nose: Nose normal. No congestion or rhinorrhea.     Mouth/Throat:     Mouth: Mucous membranes are moist.     Dentition: Abnormal dentition.     Pharynx: Oropharynx is clear. No oropharyngeal exudate or posterior oropharyngeal erythema.  Eyes:     General:        Right eye: No discharge.        Left eye: No discharge.     Extraocular Movements: Extraocular movements intact.     Conjunctiva/sclera: Conjunctivae normal.     Pupils: Pupils are equal, round, and reactive to light.  Neck:     Musculoskeletal: Normal range of motion and neck supple. No neck rigidity or muscular tenderness.     Thyroid: No thyromegaly.     Vascular: No carotid bruit.     Trachea: No tracheal deviation.  Cardiovascular:     Rate and Rhythm: Normal rate and regular rhythm.     Pulses: Normal pulses.     Heart sounds: Murmur present.  No friction rub. No gallop.   Pulmonary:     Effort: Pulmonary effort is normal. No respiratory distress.     Breath sounds: Normal breath sounds. No stridor. No wheezing, rhonchi or rales.  Chest:     Chest wall: No tenderness.  Abdominal:     General: Bowel sounds are normal. There is no distension.     Palpations: Abdomen is soft. There is no mass.     Tenderness: There is no abdominal tenderness. There is no right CVA tenderness, left CVA tenderness, guarding or rebound.     Hernia: No hernia is present.  Musculoskeletal: Normal range of motion.        General: No swelling, tenderness, deformity or signs of injury.     Right lower leg: No edema.     Left lower leg: No edema.     Comments: Bouchard's nodes noted on multiple fingers  Lymphadenopathy:     Cervical: No cervical adenopathy.  Skin:    General: Skin is warm and dry.     Capillary Refill: Capillary refill takes less than 2 seconds.     Coloration: Skin is not jaundiced or pale.     Findings: No bruising, erythema, lesion or rash.  Neurological:     General: No focal deficit present.     Mental Status: She is alert and oriented to person, place, and time. Mental status is at baseline.     Cranial Nerves: No cranial nerve deficit.     Sensory: No sensory deficit.     Motor: No weakness.     Coordination: Coordination normal.     Gait: Gait normal.     Deep Tendon Reflexes: Reflexes normal.  Psychiatric:        Behavior: Behavior normal.        Thought Content: Thought content normal.        Judgment: Judgment normal.       Assessment & Plan:  1. Routine general medical examination at a health  care facility - Continue to stay active and eat healthy  - Follow up in one year or sooner if needed - CBC with Differential/Platelet - Comprehensive metabolic panel - Lipid panel - TSH  2. Generalized anxiety disorder - Will increase Zoloft from 50 mg to 100 mg daily for better control of anxiety  - sertraline  (ZOLOFT) 100 MG tablet; Take 1 tablet (100 mg total) by mouth daily.  Dispense: 90 tablet; Refill: 1  3. Primary insomnia - Continue with Xanax as neededs  4. SVT (supraventricular tachycardia) (HCC) - Continue with BB. Follow up with Cardiology in February 2021 - CBC with Differential/Platelet - Comprehensive metabolic panel - Lipid panel - TSH  5. Need for immunization against influenza - Flu shot given   Dorothyann Peng, NP

## 2019-09-21 ENCOUNTER — Telehealth: Payer: Self-pay | Admitting: Adult Health

## 2019-09-21 NOTE — Telephone Encounter (Signed)
Pt called back to get test results.  Copied from [place] [id number]. Topic: Quick Communication - Lab Results (Clinic Use ONLY) >> Sep 21, 2019 12:24 PM Miles Costain T, CMA wrote: Called patient to inform them of all lab results from 09/20/2019. When patient returns call, triage nurse may disclose results.

## 2019-09-29 ENCOUNTER — Other Ambulatory Visit: Payer: Self-pay | Admitting: Cardiovascular Disease

## 2019-09-29 MED ORDER — METOPROLOL TARTRATE 25 MG PO TABS: 25.0000 mg | ORAL_TABLET | Freq: Two times a day (BID) | ORAL | 1 refills | Status: DC

## 2019-09-29 NOTE — Telephone Encounter (Signed)
Pt's medication was sent to pt's pharmacy as requested. Confirmation received.  °

## 2019-10-25 ENCOUNTER — Other Ambulatory Visit: Payer: Self-pay | Admitting: Adult Health

## 2019-12-27 ENCOUNTER — Other Ambulatory Visit: Payer: Self-pay | Admitting: Adult Health

## 2020-01-09 ENCOUNTER — Telehealth: Payer: Self-pay | Admitting: Cardiovascular Disease

## 2020-01-09 ENCOUNTER — Telehealth: Payer: Self-pay | Admitting: *Deleted

## 2020-01-09 NOTE — Telephone Encounter (Signed)
Copied from [place] [id number]. Topic: General - Other >> Jan 09, 2020  3:00 PM [name] wrote: Reason for CRM: Pt wants to know if any of her meds will have a reaction to the covid Vaccine and if it is ok for her to get the covid vaccine/ please advise asap/Pt gets vaccine tomorrow

## 2020-01-09 NOTE — Telephone Encounter (Signed)
We are recommending the COVID-19 vaccine to all of our patients. Cardiac medications (including blood thinners) should not deter anyone from being vaccinated and there is no need to hold any of those medications prior to vaccine administration.     Currently, there is a hotline to call (active 01/06/20) to schedule vaccination appointments as no walk-ins will be accepted.   Number: [phone]    If you have further questions or concerns about the vaccine process, please visit [email or web address] or contact your primary care physician.  Informed Patient of listed information. Verbalized understanding.

## 2020-01-10 NOTE — Telephone Encounter (Signed)
Called patient. No answer. LVM that is okay to get vaccine.

## 2020-01-10 NOTE — Telephone Encounter (Signed)
She is fine to get the Covid Vaccination

## 2020-02-23 NOTE — Progress Notes (Signed)
Patient ID: [name], female   DOB: 1936/02/18, 84 y.o.   MRN: [id number]     84 y.o. with history of benign palpitations since 2011.  History of RBBB and murmur of AV sclerosis Monitor at that time showed long RP tachycardia Rx with beta blocker Seen by Dr Rayann Heman EP no other Rx needed.  Sees Pulmonary for loculated left pleural effusion and 72mm lung nodule.  F/U CT September 2016 improved after 3 weeks Augmentin Rx  Widowed for 19 years but worries about her kids/grand kids   RX anxiety with xanax and Zoloft  Some arthritis in her hands   She has 6 children all in [place] or [place] She has had COVID vaccine   ROS: Denies fever, malais, weight loss, blurry vision, decreased visual acuity, cough, sputum, SOB, hemoptysis, pleuritic pain, palpitaitons, heartburn, abdominal pain, melena, lower extremity edema, claudication, or rash.  All other systems reviewed and negative  General: Anxious black female  Healthy:  appears younger than stated age 72: normal Neck supple with no adenopathy JVP normal no bruits no thyromegaly Lungs clear with no wheezing and good diaphragmatic motion Heart:  S1/S2 SEM  murmur, no rub, gallop or click PMI normal Abdomen: benighn, BS positve, no tenderness, no AAA no bruit.  No HSM or HJR Distal pulses intact with no bruits No edema Neuro non-focal Skin warm and dry No muscular weakness   Current Outpatient Medications  Medication Sig Dispense Refill  . ALPRAZolam (XANAX) 1 MG tablet Take 1 tablet by mouth twice daily as needed for anxiety 60 tablet 2  . aspirin 81 MG tablet Take 81 mg by mouth daily.    . Calcium Carbonate-Vitamin D (CALTRATE 600+D) 600-400 MG-UNIT per chew tablet Chew 1 tablet by mouth 2 (two) times daily.      . Cyanocobalamin (VITAMIN B 12 PO) Take 100 mcg by mouth daily.    Marland Kitchen esomeprazole (NEXIUM) 40 MG capsule Take 1 capsule (40 mg total) by mouth daily at 12 noon. 30 capsule 3  . metoprolol tartrate (LOPRESSOR) 25 MG  tablet Take 1 tablet (25 mg total) by mouth 2 (two) times daily. 180 tablet 3  . Multiple Vitamin (MULTIVITAMIN) tablet Take 1 tablet by mouth daily.      . sertraline (ZOLOFT) 100 MG tablet Take 1 tablet (100 mg total) by mouth daily. 90 tablet 1   No current facility-administered medications for this visit.    Allergies  Celebrex [celecoxib]  Electrocardiogram:   02/11/18 SR LAFB/RBBB LVH  02/28/20 SR raate 60 RBBB LAFB   Assessment and Plan  SVT stable on beta blocker no need to entertain ablation at this point in time continue low dose beta blocker   Pulm:  quiescent CT  2016 with improved inflammatory changes   Anxiety: now on zoloft and xanax  f/u Dr Raliegh Ip   Murmur:   Benign SEM AV sclerosis no need for echo   Abnormal ECG: chronic bifascicular block no high grade AV block yearly ECG   [name]

## 2020-02-28 ENCOUNTER — Encounter: Payer: Self-pay | Admitting: Cardiovascular Disease

## 2020-02-28 ENCOUNTER — Ambulatory Visit (INDEPENDENT_AMBULATORY_CARE_PROVIDER_SITE_OTHER): Payer: Medicare Other | Admitting: Cardiovascular Disease

## 2020-02-28 ENCOUNTER — Other Ambulatory Visit: Payer: Self-pay

## 2020-02-28 VITALS — BP 110/68 | HR 60 | Ht 65.5 in | Wt 142.1 lb

## 2020-02-28 DIAGNOSIS — I471 Supraventricular tachycardia: Secondary | ICD-10-CM | POA: Diagnosis not present

## 2020-02-28 MED ORDER — METOPROLOL TARTRATE 25 MG PO TABS: 25.0000 mg | ORAL_TABLET | Freq: Two times a day (BID) | ORAL | 3 refills | Status: DC

## 2020-02-28 NOTE — Patient Instructions (Signed)
Medication Instructions:  *If you need a refill on your cardiac medications before your next appointment, please call your pharmacy*  Lab Work: If you have labs (blood work) drawn today and your tests are completely normal, you will receive your results only by: . MyChart Message (if you have MyChart) OR . A paper copy in the mail If you have any lab test that is abnormal or we need to change your treatment, we will call you to review the results.  Follow-Up: At [facility], you and your health needs are our priority.  As part of our continuing mission to provide you with exceptional heart care, we have created designated Provider Care Teams.  These Care Teams include your primary Cardiologist (physician) and Advanced Practice Providers (APPs -  Physician Assistants and Nurse Practitioners) who all work together to provide you with the care you need, when you need it.  We recommend signing up for the patient portal called "MyChart".  Sign up information is provided on this After Visit Summary.  MyChart is used to connect with patients for Virtual Visits (Telemedicine).  Patients are able to view lab/test results, encounter notes, upcoming appointments, etc.  Non-urgent messages can be sent to your provider as well.   To learn more about what you can do with MyChart, go to [email or web address].    Your next appointment:   1 year(s)  The format for your next appointment:   In Person  Provider:   You may see Dr. Nishan or one of the following Advanced Practice Providers on your designated Care Team:    Lori Gerhardt, NP  Laura Ingold, NP  Jill McDaniel, NP     

## 2020-04-09 ENCOUNTER — Other Ambulatory Visit: Payer: Self-pay | Admitting: Adult Health

## 2020-05-01 ENCOUNTER — Other Ambulatory Visit: Payer: Self-pay | Admitting: Adult Health

## 2020-05-01 DIAGNOSIS — F411 Generalized anxiety disorder: Secondary | ICD-10-CM

## 2020-05-02 NOTE — Telephone Encounter (Signed)
Sent to the pharmacy by e-scribe. 

## 2020-05-21 ENCOUNTER — Other Ambulatory Visit: Payer: Self-pay | Admitting: Adult Health

## 2020-05-21 DIAGNOSIS — Z1231 Encounter for screening mammogram for malignant neoplasm of breast: Secondary | ICD-10-CM

## 2020-06-07 ENCOUNTER — Encounter: Payer: Self-pay | Admitting: Adult Health

## 2020-06-07 ENCOUNTER — Other Ambulatory Visit: Payer: Self-pay

## 2020-06-07 ENCOUNTER — Ambulatory Visit (INDEPENDENT_AMBULATORY_CARE_PROVIDER_SITE_OTHER): Payer: Medicare Other | Admitting: Adult Health

## 2020-06-07 VITALS — BP 110/60 | Temp 96.9°F | Wt 143.0 lb

## 2020-06-07 DIAGNOSIS — M5441 Lumbago with sciatica, right side: Secondary | ICD-10-CM

## 2020-06-07 MED ORDER — MELOXICAM 7.5 MG PO TABS: 7.5000 mg | ORAL_TABLET | Freq: Every day | ORAL | 0 refills | Status: DC | PRN

## 2020-06-07 MED ORDER — METHYLPREDNISOLONE 4 MG PO TBPK: ORAL_TABLET | ORAL | 0 refills | Status: DC

## 2020-06-07 NOTE — Progress Notes (Signed)
Subjective:    Patient ID: [name], female    DOB: 10-21-1936, 84 y.o.   MRN: [id number]  HPI 84 year old female who is being evaluated today for right sided low back pain She has known osteoarthritis of the lumbar spine.About two weeks ago she developed a pain in her right lower back that would radiate down the outside of her right. Pain is worse with sitting and changing positions.Pain is felt as a aching sensation  Review of Systems See HPI   Past Medical History:  Diagnosis Date   Anxiety state    unspecified   ARTHRITIS 09/03/2010   Asthma    CHOLELITHIASIS 09/03/2010   COLONIC POLYPS, HX OF 06/02/2008   DIVERTICULAR DISEASE 09/03/2010   DYSPHAGIA UNSPECIFIED 09/03/2010   ESSENTIAL HYPERTENSION 10/29/2010   Fibromyalgia    GALLSTONE PANCREATITIS 09/03/2010   GERD 09/02/2007   HEART MURMUR, HX OF 08/12/2010   Hematuria    Hiatal hernia    Hx of colonoscopy    2014   HYPERLIPIDEMIA 82/99/3716   Insomnia    Lichen simplex chronicus    Low back pain    chronic   Osteopenia    OSTEOPOROSIS 05/17/2009   Palpitations 09/03/2010   event monitor reveals long RP tachycardia, likely sinus tachycardia   Paresthesia    Polyuria    RBBB 09/05/2010   VENTRICULAR HYPERTROPHY, LEFT 08/12/2010   VITAMIN B12 DEFICIENCY 09/02/2007   WEIGHT LOSS, ABNORMAL 06/02/2008    Social History   Socioeconomic History   Marital status: Widowed    Spouse name: Not on file   Number of children: 6   Years of education: Not on file   Highest education level: Not on file  Occupational History   Occupation: retired  Tobacco Use   Smoking status: Never Smoker   Smokeless tobacco: Never Used  Scientific laboratory technician Use: Never used  Substance and Sexual Activity   Alcohol use: No    Alcohol/week: 0.0 standard drinks   Drug use: No   Sexual activity: Never  Other Topics Concern   Not on file  Social History Narrative   Widowed   2 children   Lives in [place]    Retired from [employer] Strain:    Difficulty of Paying Living Expenses:   Food Insecurity:    Worried About Charity fundraiser in the Last Year:    Arboriculturist in the Last Year:   Transportation Needs:    Film/video editor (Medical):    Lack of Transportation (Non-Medical):   Physical Activity:    Days of Exercise per Week:    Minutes of Exercise per Session:   Stress:    Feeling of Stress :   Social Connections:    Frequency of Communication with Friends and Family:    Frequency of Social Gatherings with Friends and Family:    Attends Religious Services:    Active Member of Clubs or Organizations:    Attends Music therapist:    Marital Status:   Intimate Partner Violence:    Fear of Current or Ex-Partner:    Emotionally Abused:    Physically Abused:    Sexually Abused:     Past Surgical History:  Procedure Laterality Date   ABDOMINAL HYSTERECTOMY     THA and O   APPENDECTOMY     CHOLECYSTECTOMY     ESOPHAGOGASTRODUODENOSCOPY     with  HH and esophagitis   OOPHORECTOMY     SPINE SURGERY     cervical fusion    Family History  Problem Relation Age of Onset   Heart disease Mother    Stroke Mother    Prostate cancer Father     Allergies  Allergen Reactions   Celebrex [Celecoxib] Rash    REACTION: rash    Current Outpatient Medications on File Prior to Visit  Medication Sig Dispense Refill   ALPRAZolam (XANAX) 1 MG tablet Take 1 tablet by mouth twice daily as needed for anxiety 60 tablet 2   aspirin 81 MG tablet Take 81 mg by mouth daily.     Calcium Carbonate-Vitamin D (CALTRATE 600+D) 600-400 MG-UNIT per chew tablet Chew 1 tablet by mouth 2 (two) times daily.       Cyanocobalamin (VITAMIN B 12 PO) Take 100 mcg by mouth daily.     esomeprazole (NEXIUM) 40 MG capsule Take 1 capsule (40 mg total) by mouth daily at 12 noon. 30 capsule 3   metoprolol  tartrate (LOPRESSOR) 25 MG tablet Take 1 tablet (25 mg total) by mouth 2 (two) times daily. 180 tablet 3   Multiple Vitamin (MULTIVITAMIN) tablet Take 1 tablet by mouth daily.       sertraline (ZOLOFT) 100 MG tablet Take 1 tablet by mouth once daily 90 tablet 1   No current facility-administered medications on file prior to visit.    There were no vitals taken for this visit.      Objective:   Physical Exam Vitals and nursing note reviewed.  Constitutional:      Appearance: Normal appearance.  Musculoskeletal:        General: Tenderness (right lower back ) present. Normal range of motion.  Skin:    General: Skin is warm and dry.     Capillary Refill: Capillary refill takes less than 2 seconds.  Neurological:     General: No focal deficit present.     Mental Status: She is alert and oriented to person, place, and time.  Psychiatric:        Mood and Affect: Mood normal.        Behavior: Behavior normal.        Thought Content: Thought content normal.        Judgment: Judgment normal.       Assessment & Plan:  1. Acute right-sided low back pain with right-sided sciatica - methylPREDNISolone (MEDROL DOSEPAK) 4 MG TBPK tablet; Take as directed  Dispense: 21 tablet; Refill: 0 - meloxicam (MOBIC) 7.5 MG tablet; Take 1 tablet (7.5 mg total) by mouth daily as needed for pain.  Dispense: 30 tablet; Refill: 0 - Use warm compress and gentle stretching exercises  - Follow up if no improvement   Dorothyann Peng, NP

## 2020-06-07 NOTE — Patient Instructions (Signed)
I have sent in prednisone and mobic to help with the sciatica pain.   Please let me know if you do not feel any better  You are due for your physical exam after September 22

## 2020-06-20 ENCOUNTER — Telehealth: Payer: Self-pay | Admitting: Adult Health

## 2020-06-20 NOTE — Progress Notes (Signed)
  Chronic Care Management   Note  06/20/2020 Name: [name] MRN: [id number] DOB: [birth year]  [name] is a 84 y.o. year old female who is a primary care patient of Dorothyann Peng, NP. I reached out to [name] by phone today in response to a referral sent by Ms. Louvina M Brunet's PCP, Dorothyann Peng, NP.   Ms. Nofziger was given information about Chronic Care Management services today including:  1. CCM service includes personalized support from designated clinical staff supervised by her physician, including individualized plan of care and coordination with other care providers 2. 24/7 contact phone numbers for assistance for urgent and routine care needs. 3. Service will only be billed when office clinical staff spend 20 minutes or more in a month to coordinate care. 4. Only one practitioner may furnish and bill the service in a calendar month. 5. The patient may stop CCM services at any time (effective at the end of the month) by phone call to the office staff.   Patient agreed to services and verbal consent obtained.   Follow up plan:   [place]

## 2020-06-28 ENCOUNTER — Ambulatory Visit: Payer: Medicare Other

## 2020-07-17 ENCOUNTER — Ambulatory Visit
Admission: RE | Admit: 2020-07-17 | Discharge: 2020-07-17 | Disposition: A | Discharge status: Home / Self Care | Payer: Medicare Other | Source: Ambulatory Visit | Attending: Adult Health | Admitting: Adult Health

## 2020-07-17 ENCOUNTER — Other Ambulatory Visit: Payer: Self-pay

## 2020-07-17 DIAGNOSIS — Z1231 Encounter for screening mammogram for malignant neoplasm of breast: Secondary | ICD-10-CM | POA: Diagnosis not present

## 2020-08-14 ENCOUNTER — Other Ambulatory Visit: Payer: Self-pay | Admitting: Adult Health

## 2020-08-14 DIAGNOSIS — I10 Essential (primary) hypertension: Secondary | ICD-10-CM

## 2020-08-14 DIAGNOSIS — E089 Diabetes mellitus due to underlying condition without complications: Secondary | ICD-10-CM

## 2020-08-15 ENCOUNTER — Telehealth: Payer: Self-pay

## 2020-08-15 NOTE — Progress Notes (Signed)
    Chronic Care Management Pharmacy Assistant   Name: [name]  MRN: [id number] DOB: 11-22-1936  Reason for Encounter: Medication Review /Initial question for initial visit with clinical pharmaisct.    PCP : Dorothyann Peng, NP  Allergies:   Allergies  Allergen Reactions  . Celebrex [Celecoxib] Rash    REACTION: rash    Medications: Outpatient Encounter Medications as of 08/15/2020  Medication Sig  . ALPRAZolam (XANAX) 1 MG tablet Take 1 tablet by mouth twice daily as needed for anxiety  . aspirin 81 MG tablet Take 81 mg by mouth daily.  . Calcium Carbonate-Vitamin D (CALTRATE 600+D) 600-400 MG-UNIT per chew tablet Chew 1 tablet by mouth 2 (two) times daily.    . Cyanocobalamin (VITAMIN B 12 PO) Take 100 mcg by mouth daily.  Marland Kitchen esomeprazole (NEXIUM) 40 MG capsule Take 1 capsule (40 mg total) by mouth daily at 12 noon.  . meloxicam (MOBIC) 7.5 MG tablet Take 1 tablet (7.5 mg total) by mouth daily as needed for pain.  . methylPREDNISolone (MEDROL DOSEPAK) 4 MG TBPK tablet Take as directed  . metoprolol tartrate (LOPRESSOR) 25 MG tablet Take 1 tablet (25 mg total) by mouth 2 (two) times daily.  . Multiple Vitamin (MULTIVITAMIN) tablet Take 1 tablet by mouth daily.    . sertraline (ZOLOFT) 100 MG tablet Take 1 tablet by mouth once daily   No facility-administered encounter medications on file as of 08/15/2020.    Current Diagnosis: Patient Active Problem List   Diagnosis Date Noted  . SVT (supraventricular tachycardia) ([place]) 03/13/2019  . Impaired glucose tolerance 11/26/2015  . Lung nodule 06/22/2015  . Thyroid nodule, uninodular, isthmus 08/01/2014  . Bifascicular block 05/12/2011  . Hiatal hernia   . Essential hypertension 10/29/2010  . RBBB 09/05/2010  . DIVERTICULAR DISEASE 09/03/2010  . VENTRICULAR HYPERTROPHY, LEFT 08/12/2010  . Insomnia 08/12/2010  . HEART MURMUR, HX OF 08/12/2010  . LICHEN SIMPLEX CHRONICUS 01/17/2010  . Osteoporosis 05/17/2009  .  Generalized anxiety disorder 08/30/2008  . Personal hx of colon adenomas 06/02/2008  . Hyperlipidemia 12/09/2007  . LOW BACK PAIN 12/09/2007  . B12 deficiency 09/02/2007  . GERD 09/02/2007  . Asthma 08/10/2007    Goals Addressed   None     Follow-Up:  Pharmacist Review   Have you seen any other providers since your last visit? * Any changes in your medications or health? no Any side effects from any medications? no Do you have an symptoms or problems not managed by your medications? no Any concerns about your health right now? Yes  Patient stated she is having nerve pain. Has your provider asked that you check blood pressure, blood sugar, or follow special diet at home? No  Patient state she does not cook at home.  Do you get any type of exercise on a regular basis?   Patient stated she is not exercising right now but she is going to start Monday at [facility]. Can you think of a goal you would like to reach for your health? None ID Do you have any problems getting your medications? no Is there anything that you would like to discuss during the appointment?   Patient stated she could discuss her nerve pain.  Please bring medications and supplements to appointment   [place] Pharmacist Assistant [phone]

## 2020-08-15 NOTE — Chronic Care Management (AMB) (Deleted)
Chronic Care Management Pharmacy  Name: [name]  MRN: [id number] DOB: 1936/07/28   Chief Complaint/ HPI  [name],  84 y.o. , female presents for their Initial CCM visit with the clinical pharmacist via telephone.  PCP : Dorothyann Peng, NP Patient Care Team: Dorothyann Peng, NP as PCP - General (Family Medicine) Germaine Pomfret, [facility] as Pharmacist (Pharmacist)  Their chronic conditions include: Hypertension, GERD, Asthma, Anxiety, Osteoporosis and Insomnia   Office Visits: 06/07/20: Patient presented to Dorothyann Peng, NP for acute back pain. Patient started on meloxicam and medrol dosepack 02/28/20: Patient presented to Dr. Johnsie Cancel (Cardiology) for follow-up. Flovent HFA stopped.   Consult Visit: None in past 6 months noted.   Allergies  Allergen Reactions   Celebrex [Celecoxib] Rash    REACTION: rash    Medications: Outpatient Encounter Medications as of 08/16/2020  Medication Sig   ALPRAZolam (XANAX) 1 MG tablet Take 1 tablet by mouth twice daily as needed for anxiety   aspirin 81 MG tablet Take 81 mg by mouth daily.   Calcium Carbonate-Vitamin D (CALTRATE 600+D) 600-400 MG-UNIT per chew tablet Chew 1 tablet by mouth 2 (two) times daily.     Cyanocobalamin (VITAMIN B 12 PO) Take 100 mcg by mouth daily.   esomeprazole (NEXIUM) 40 MG capsule Take 1 capsule (40 mg total) by mouth daily at 12 noon.   meloxicam (MOBIC) 7.5 MG tablet Take 1 tablet (7.5 mg total) by mouth daily as needed for pain.   methylPREDNISolone (MEDROL DOSEPAK) 4 MG TBPK tablet Take as directed   metoprolol tartrate (LOPRESSOR) 25 MG tablet Take 1 tablet (25 mg total) by mouth 2 (two) times daily.   Multiple Vitamin (MULTIVITAMIN) tablet Take 1 tablet by mouth daily.     sertraline (ZOLOFT) 100 MG tablet Take 1 tablet by mouth once daily   No facility-administered encounter medications on file as of 08/16/2020.     Current Diagnosis/Assessment:    Goals Addressed     None     Asthma   Last spirometry score: ***  Gold Grade: {CHL HP [facility] Pharm COPD Gold GGEZM:[id number]} Current COPD Classification:  {CHL HP [facility] Pharm COPD Classification:[phone]}  Eosinophil count:   Lab Results  Component Value Date/Time   EOSPCT 6.2 (H) 09/20/2019 11:19 AM  %                               Eos (Absolute):  Lab Results  Component Value Date/Time   EOSABS 0.3 09/20/2019 11:19 AM    Tobacco Status:  Social History   Tobacco Use  Smoking Status Never Smoker  Smokeless Tobacco Never Used    Patient has failed these meds in past: Flovent HFA  Patient is currently {CHL Controlled/[phone]} on the following medications:    None  Using maintenance inhaler regularly? {yes/no:20286} Frequency of rescue inhaler use:  {CHL HP [facility] Pharm Inhaler KPTW:6568127517}  We discussed:  {CHL HP [facility] Pharmacy discussion:[id number]}  Plan  Continue {CHL HP [facility] Pharmacy [phone]}  Hypertension   BP goal is:  {CHL HP UPSTREAM Pharmacist BP [phone]}  Office blood pressures are  BP Readings from Last 3 Encounters:  06/07/20 110/60  02/28/20 110/68  09/20/19 126/60   Patient checks BP at home {CHL HP BP Monitoring Frequency:[phone]} Patient home BP readings are ranging: ***  Patient has failed these meds in the past: *** Patient is currently {CHL Controlled/[phone]} on the following medications:  Metoprolol Tartrate 25 mg twice daily   We discussed {CHL HP [facility] Pharmacy [phone]}  Plan  Continue {CHL HP [facility] Pharmacy Plans:[phone]}   Depression / Anxiety   PHQ9 Score:  PHQ9 SCORE ONLY 09/20/2019 09/09/2018 08/13/2016  PHQ-9 Total Score 2 7 10    GAD7 Score: No flowsheet data found.  Patient has failed these meds in past: *** Patient is currently {CHL Controlled/[phone]} on the following medications:   Alprazolam 1 mg BID    Sertraline 100 mg daily   We discussed:  ***  Plan  Continue {CHL HP [facility] Pharmacy Plans:[phone]}  GERD   Patient has failed these meds in past: *** Patient is currently {CHL Controlled/[phone]} on the following medications:   Esomeprazole 40 mg daily   We discussed:  ***  Plan  Continue {CHL HP [facility]stream Pharmacy MMHWK:0881103159}   ***   Patient has failed these meds in past: *** Patient is currently {CHL Controlled/[phone]} on the following medications:   Aspirin 81 mg daily   Calcium Carbonate + D 600-400 twice daily   Vitamin B12 100 mcg daily   Meloxicam 7.5 mg daily PRN   Medrol dosepak  Multivitamin daily   We discussed:  ***  Plan  Continue {CHL HP Upstream Pharmacy Plans:[phone]}   Medication Management   Pt uses *** pharmacy for all medications Uses pill box? {Yes or If no, why not?:20788} Pt endorses ***% compliance  We discussed: ***  Plan  {US Pharmacy YVOP:92924}    Follow up: *** month phone visit  ***

## 2020-08-16 ENCOUNTER — Telehealth: Payer: Medicare Other

## 2020-08-20 ENCOUNTER — Telehealth: Payer: Self-pay | Admitting: Adult Health

## 2020-08-20 NOTE — Progress Notes (Signed)
  Chronic Care Management   Note  08/20/2020 Name: [name] MRN: [id number] DOB: 1936/06/04  [name] is a 84 y.o. year old female who is a primary care patient of Dorothyann Peng, NP. I reached out to Linford Arnold by phone today in response to a referral sent by Ms. [place] PCP, Dorothyann Peng, NP.   Ms. Beavers was given information about Chronic Care Management services today including:  1. CCM service includes personalized support from designated clinical staff supervised by her physician, including individualized plan of care and coordination with other care providers 2. 24/7 contact phone numbers for assistance for urgent and routine care needs. 3. Service will only be billed when office clinical staff spend 20 minutes or more in a month to coordinate care. 4. Only one practitioner may furnish and bill the service in a calendar month. 5. The patient may stop CCM services at any time (effective at the end of the month) by phone call to the office staff.   Patient agreed to services and verbal consent obtained.   Follow up plan:   Carley Perdue [facility] Scheduler

## 2020-09-11 ENCOUNTER — Other Ambulatory Visit: Payer: Self-pay | Admitting: Adult Health

## 2020-09-19 ENCOUNTER — Other Ambulatory Visit: Payer: Self-pay

## 2020-09-20 ENCOUNTER — Other Ambulatory Visit: Payer: Self-pay

## 2020-09-20 ENCOUNTER — Ambulatory Visit (INDEPENDENT_AMBULATORY_CARE_PROVIDER_SITE_OTHER): Payer: Medicare Other | Admitting: Adult Health

## 2020-09-20 ENCOUNTER — Encounter: Payer: Self-pay | Admitting: Adult Health

## 2020-09-20 VITALS — BP 110/70 | HR 87 | Temp 97.9°F | Ht 65.0 in | Wt 148.0 lb

## 2020-09-20 DIAGNOSIS — I471 Supraventricular tachycardia, unspecified: Secondary | ICD-10-CM

## 2020-09-20 DIAGNOSIS — F5101 Primary insomnia: Secondary | ICD-10-CM

## 2020-09-20 DIAGNOSIS — Z23 Encounter for immunization: Secondary | ICD-10-CM | POA: Diagnosis not present

## 2020-09-20 DIAGNOSIS — Z0000 Encounter for general adult medical examination without abnormal findings: Secondary | ICD-10-CM

## 2020-09-20 DIAGNOSIS — I10 Essential (primary) hypertension: Secondary | ICD-10-CM | POA: Diagnosis not present

## 2020-09-20 DIAGNOSIS — F411 Generalized anxiety disorder: Secondary | ICD-10-CM

## 2020-09-20 MED ORDER — CITALOPRAM HYDROBROMIDE 20 MG PO TABS: 20.0000 mg | ORAL_TABLET | Freq: Every day | ORAL | 0 refills | Status: DC

## 2020-09-20 NOTE — Patient Instructions (Addendum)
I am going to switch your Zoloft to a new medication called Celexa - hopefully this will help with your anxiety and depression. Please follow up with me if your symptoms do not improve    We will follow up with you regarding your blood work

## 2020-09-20 NOTE — Progress Notes (Signed)
Subjective:    Patient ID: [name], female    DOB: 1936/08/29, 84 y.o.   MRN: [id number]  HPI Patient presents for yearly preventative medicine examination. She is a pleasant 84 year old female who  has a past medical history of Anxiety state, ARTHRITIS (09/03/2010), Asthma, CHOLELITHIASIS (09/03/2010), COLONIC POLYPS, HX OF (06/02/2008), DIVERTICULAR DISEASE (09/03/2010), DYSPHAGIA UNSPECIFIED (09/03/2010), ESSENTIAL HYPERTENSION (10/29/2010), Fibromyalgia, GALLSTONE PANCREATITIS (09/03/2010), GERD (09/02/2007), HEART MURMUR, HX OF (08/12/2010), Hematuria, Hiatal hernia, colonoscopy, HYPERLIPIDEMIA (26/37/8588), Insomnia, Lichen simplex chronicus, Low back pain, Osteopenia, OSTEOPOROSIS (05/17/2009), Palpitations (09/03/2010), Paresthesia, Polyuria, RBBB (09/05/2010), VENTRICULAR HYPERTROPHY, LEFT (08/12/2010), VITAMIN B12 DEFICIENCY (09/02/2007), and WEIGHT LOSS, ABNORMAL (06/02/2008).  Anxiety/Depression -currently prescribed Zoloft 100 mg daily.  She continues to have significant anxiety and feels depressed often.  Her anxiety stems from worried about her children and grandkids having to pay their bills.  She does take Xanax as needed to help with her anxiety symptoms as well as for insomnia.  She is wondering if she can change medications to help control her symptoms better  GERD-controlled with Nexium  Osteoarthritis-mainly in her lower back and bilateral hands.  She does take Mobic on occasion which helps  History of SVT-followed by cardiology yearly.  Currently prescribed Lopressor.  She denies palpitations, chest pain, or shortness of breath   All immunizations and health maintenance protocols were reviewed with the patient and needed orders were placed.  Appropriate screening laboratory values were ordered for the patient including screening of hyperlipidemia, renal function and hepatic function.  Medication reconciliation,  past medical history, social history, problem list and allergies were  reviewed in detail with the patient  Goals were established with regard to weight loss, exercise, and  diet in compliance with medications  End of life planning was discussed.  She does have an advanced directive and living will   Review of Systems  Constitutional: Negative.   HENT: Negative.   Eyes: Negative.   Respiratory: Negative.   Cardiovascular: Negative.   Gastrointestinal: Negative.   Endocrine: Negative.   Genitourinary: Negative.   Musculoskeletal: Positive for arthralgias and back pain.  Skin: Negative.   Allergic/Immunologic: Negative.   Neurological: Negative.   Hematological: Negative.   Psychiatric/Behavioral: Positive for dysphoric mood and sleep disturbance. The patient is nervous/anxious.    Past Medical History:  Diagnosis Date  . Anxiety state    unspecified  . ARTHRITIS 09/03/2010  . Asthma   . CHOLELITHIASIS 09/03/2010  . COLONIC POLYPS, HX OF 06/02/2008  . DIVERTICULAR DISEASE 09/03/2010  . DYSPHAGIA UNSPECIFIED 09/03/2010  . ESSENTIAL HYPERTENSION 10/29/2010  . Fibromyalgia   . GALLSTONE PANCREATITIS 09/03/2010  . GERD 09/02/2007  . HEART MURMUR, HX OF 08/12/2010  . Hematuria   . Hiatal hernia   . Hx of colonoscopy    2014  . HYPERLIPIDEMIA 12/09/2007  . Insomnia   . Lichen simplex chronicus   . Low back pain    chronic  . Osteopenia   . OSTEOPOROSIS 05/17/2009  . Palpitations 09/03/2010   event monitor reveals long RP tachycardia, likely sinus tachycardia  . Paresthesia   . Polyuria   . RBBB 09/05/2010  . VENTRICULAR HYPERTROPHY, LEFT 08/12/2010  . VITAMIN B12 DEFICIENCY 09/02/2007  . WEIGHT LOSS, ABNORMAL 06/02/2008    Social History   Socioeconomic History  . Marital status: Widowed    Spouse name: Not on file  . Number of children: 6  . Years of education: Not on file  . Highest education level: Not on file  Occupational History  . Occupation: retired  Tobacco Use  . Smoking status: Never Smoker  . Smokeless tobacco: Never Used  Vaping Use    . Vaping Use: Never used  Substance and Sexual Activity  . Alcohol use: No    Alcohol/week: 0.0 standard drinks  . Drug use: No  . Sexual activity: Never  Other Topics Concern  . Not on file  Social History Narrative   Widowed   2 children   Lives in [place]   Retired from [employer] Strain:   . Difficulty of Paying Living Expenses: Not on file  Food Insecurity:   . Worried About Charity fundraiser in the Last Year: Not on file  . Ran Out of Food in the Last Year: Not on file  Transportation Needs:   . Lack of Transportation (Medical): Not on file  . Lack of Transportation (Non-Medical): Not on file  Physical Activity:   . Days of Exercise per Week: Not on file  . Minutes of Exercise per Session: Not on file  Stress:   . Feeling of Stress : Not on file  Social Connections:   . Frequency of Communication with Friends and Family: Not on file  . Frequency of Social Gatherings with Friends and Family: Not on file  . Attends Religious Services: Not on file  . Active Member of Clubs or Organizations: Not on file  . Attends Archivist Meetings: Not on file  . Marital Status: Not on file  Intimate Partner Violence:   . Fear of Current or Ex-Partner: Not on file  . Emotionally Abused: Not on file  . Physically Abused: Not on file  . Sexually Abused: Not on file    Past Surgical History:  Procedure Laterality Date  . ABDOMINAL HYSTERECTOMY     THA and O  . APPENDECTOMY    . CHOLECYSTECTOMY    . ESOPHAGOGASTRODUODENOSCOPY     with HH and esophagitis  . OOPHORECTOMY    . SPINE SURGERY     cervical fusion    Family History  Problem Relation Age of Onset  . Heart disease Mother   . Stroke Mother   . Prostate cancer Father     Allergies  Allergen Reactions  . Celebrex [Celecoxib] Rash    REACTION: rash    Current Outpatient Medications on File Prior to Visit  Medication Sig Dispense Refill  .  ALPRAZolam (XANAX) 1 MG tablet Take 1 tablet by mouth twice daily as needed for anxiety 60 tablet 0  . aspirin 81 MG tablet Take 81 mg by mouth daily.    . Calcium Carbonate-Vitamin D (CALTRATE 600+D) 600-400 MG-UNIT per chew tablet Chew 1 tablet by mouth 2 (two) times daily.      . Cyanocobalamin (VITAMIN B 12 PO) Take 100 mcg by mouth daily.    Marland Kitchen esomeprazole (NEXIUM) 40 MG capsule Take 1 capsule (40 mg total) by mouth daily at 12 noon. 30 capsule 3  . meloxicam (MOBIC) 7.5 MG tablet Take 1 tablet (7.5 mg total) by mouth daily as needed for pain. 30 tablet 0  . metoprolol tartrate (LOPRESSOR) 25 MG tablet Take 1 tablet (25 mg total) by mouth 2 (two) times daily. 180 tablet 3  . Multiple Vitamin (MULTIVITAMIN) tablet Take 1 tablet by mouth daily.       No current facility-administered medications on file prior to visit.    BP 110/70 (BP Location: Left  Arm, Patient Position: Sitting, Cuff Size: Normal)   Pulse 87   Temp 97.9 F (36.6 C) (Oral)   Ht 5' 5"  (1.651 m)   Wt 148 lb (67.1 kg)   SpO2 98%   BMI 24.63 kg/m       Objective:   Physical Exam Vitals and nursing note reviewed.  Constitutional:      General: She is not in acute distress.    Appearance: Normal appearance. She is well-developed. She is not ill-appearing.  HENT:     Head: Normocephalic and atraumatic.     Right Ear: Tympanic membrane, ear canal and external ear normal. There is no impacted cerumen.     Left Ear: Tympanic membrane, ear canal and external ear normal. There is no impacted cerumen.     Nose: Nose normal. No congestion or rhinorrhea.     Mouth/Throat:     Mouth: Mucous membranes are moist.     Pharynx: Oropharynx is clear. No oropharyngeal exudate or posterior oropharyngeal erythema.  Eyes:     General:        Right eye: No discharge.        Left eye: No discharge.     Extraocular Movements: Extraocular movements intact.     Conjunctiva/sclera: Conjunctivae normal.     Pupils: Pupils are equal,  round, and reactive to light.  Neck:     Thyroid: No thyromegaly.     Vascular: No carotid bruit.     Trachea: No tracheal deviation.  Cardiovascular:     Rate and Rhythm: Normal rate and regular rhythm.     Pulses: Normal pulses.     Heart sounds: Normal heart sounds. No murmur heard.  No friction rub. No gallop.   Pulmonary:     Effort: Pulmonary effort is normal. No respiratory distress.     Breath sounds: Normal breath sounds. No stridor. No wheezing, rhonchi or rales.  Chest:     Chest wall: No tenderness.  Abdominal:     General: Abdomen is flat. Bowel sounds are normal. There is no distension.     Palpations: Abdomen is soft. There is no mass.     Tenderness: There is no abdominal tenderness. There is no right CVA tenderness, left CVA tenderness, guarding or rebound.     Hernia: No hernia is present.  Musculoskeletal:        General: Deformity (multiple fingers bilaterally ) present. No swelling, tenderness or signs of injury. Normal range of motion.     Cervical back: Normal range of motion and neck supple.     Right lower leg: No edema.     Left lower leg: No edema.  Lymphadenopathy:     Cervical: No cervical adenopathy.  Skin:    General: Skin is warm and dry.     Coloration: Skin is not jaundiced or pale.     Findings: No bruising, erythema, lesion or rash.  Neurological:     General: No focal deficit present.     Mental Status: She is alert and oriented to person, place, and time.     Cranial Nerves: No cranial nerve deficit.     Sensory: No sensory deficit.     Motor: No weakness.     Coordination: Coordination normal.     Gait: Gait normal.     Deep Tendon Reflexes: Reflexes normal.  Psychiatric:        Mood and Affect: Mood normal.        Behavior: Behavior normal.  Thought Content: Thought content normal.        Judgment: Judgment normal.       Assessment & Plan:  1. Routine general medical examination at a health care facility - Remarkable 84  year old female.  - Continue yearly follow up  - CBC with Differential/Platelet; Future - Lipid panel; Future - TSH; Future - CMP with eGFR([facility]); Future - CMP with eGFR([facility]) - TSH - Lipid panel - CBC with Differential/Platelet  2. Generalized anxiety disorder - Will d/c Zoloft. Will trial her on Celexa 20 mg daily. Advised follow up in 4 weeks or sooner if needed  3. Primary insomnia - Continue with xanax PRN  4. Need for immunization against influenza  - Flu Vaccine QUAD High Dose([facility])  5. SVT (supraventricular tachycardia) ([place]) - Follow up with Cardiology as directed - CBC with Differential/Platelet; Future - Lipid panel; Future - TSH; Future - CMP with eGFR([facility]); Future - citalopram (CELEXA) 20 MG tablet; Take 1 tablet (20 mg total) by mouth daily.  Dispense: 90 tablet; Refill: 0 - CMP with eGFR([facility]) - TSH - Lipid panel - CBC with Differential/Platelet   Dorothyann Peng, NP

## 2020-09-21 ENCOUNTER — Encounter: Payer: Medicare Other | Admitting: Adult Health

## 2020-09-21 LAB — COMPLETE METABOLIC PANEL WITH GFR
AG Ratio: 1.5 (calc) (ref 1.0–2.5)
ALT: 20 U/L (ref 6–29)
AST: 17 U/L (ref 10–35)
Albumin: 4.1 g/dL (ref 3.6–5.1)
Alkaline phosphatase (APISO): 131 U/L (ref 37–153)
BUN/Creatinine Ratio: 20 (calc) (ref 6–22)
BUN: 10 mg/dL (ref 7–25)
CO2: 28 mmol/L (ref 20–32)
Calcium: 9.5 mg/dL (ref 8.6–10.4)
Chloride: 105 mmol/L (ref 98–110)
Creat: 0.5 mg/dL — ABNORMAL LOW (ref 0.60–0.88)
GFR, Est African American: 104 mL/min/{1.73_m2} (ref >=60)
GFR, Est Non African American: 90 mL/min/{1.73_m2} (ref >=60)
Globulin: 2.7 g/dL (calc) (ref 1.9–3.7)
Glucose, Bld: 91 mg/dL (ref 65–99)
Potassium: 4.8 mmol/L (ref 3.5–5.3)
Sodium: 139 mmol/L (ref 135–146)
Total Bilirubin: 0.6 mg/dL (ref 0.2–1.2)
Total Protein: 6.8 g/dL (ref 6.1–8.1)

## 2020-09-21 LAB — CBC WITH DIFFERENTIAL/PLATELET
Absolute Monocytes: 510 cells/uL (ref 200–950)
Basophils Absolute: 71 cells/uL (ref 0–200)
Basophils Relative: 1.4 %
Eosinophils Absolute: 428 cells/uL (ref 15–500)
Eosinophils Relative: 8.4 %
HCT: 38.5 % (ref 35.0–45.0)
Hemoglobin: 12.7 g/dL (ref 11.7–15.5)
Lymphs Abs: 1887 cells/uL (ref 850–3900)
MCH: 29.3 pg (ref 27.0–33.0)
MCHC: 33 g/dL (ref 32.0–36.0)
MCV: 88.9 fL (ref 80.0–100.0)
MPV: 11.8 fL (ref 7.5–12.5)
Monocytes Relative: 10 %
Neutro Abs: 2203 cells/uL (ref 1500–7800)
Neutrophils Relative %: 43.2 %
Platelets: 252 10*3/uL (ref 140–400)
RBC: 4.33 10*6/uL (ref 3.80–5.10)
RDW: 13.2 % (ref 11.0–15.0)
Total Lymphocyte: 37 %
WBC: 5.1 10*3/uL (ref 3.8–10.8)

## 2020-09-21 LAB — LIPID PANEL
Cholesterol: 184 mg/dL (ref <200)
HDL: 73 mg/dL (ref >=50)
LDL Cholesterol (Calc): 93 mg/dL (calc)
Non-HDL Cholesterol (Calc): 111 mg/dL (calc) (ref <130)
Total CHOL/HDL Ratio: 2.5 (calc) (ref <5.0)
Triglycerides: 85 mg/dL (ref <150)

## 2020-09-21 LAB — TSH: TSH: 1.75 mIU/L (ref 0.40–4.50)

## 2020-10-04 DIAGNOSIS — Z6824 Body mass index (BMI) 24.0-24.9, adult: Secondary | ICD-10-CM | POA: Diagnosis not present

## 2020-10-05 NOTE — Chronic Care Management (AMB) (Deleted)
Chronic Care Management Pharmacy  Name: [name]  MRN: [id number] DOB: 06-26-1936   Chief Complaint/ HPI  [name],  84 y.o. , female presents for their Initial CCM visit with the clinical pharmacist via telephone.  PCP : Dorothyann Peng, NP Patient Care Team: Dorothyann Peng, NP as PCP - General (Family Medicine) Germaine Pomfret, [facility] as Pharmacist (Pharmacist)  Their chronic conditions include: Hypertension, Hyperlipidemia, GERD, Anxiety and Osteoporosis   Office Visits: 09/20/20: Patient presented to Dorothyann Peng, NP for follow-up. Sertraline, methylprednisolone stopped, citalopram 20 mg daily started.  06/07/20: Patient presented to Dorothyann Peng, NP for follow-up. Patient started on meloxicam, methylprednisolone for acute sciatica pain.   Consult Visit: 02/28/20: Patient presented to Dr. Johnsie Cancel (Cardiology) for follow-up.   Allergies  Allergen Reactions  . Celebrex [Celecoxib] Rash    REACTION: rash    Medications: Outpatient Encounter Medications as of 10/09/2020  Medication Sig  . ALPRAZolam (XANAX) 1 MG tablet Take 1 tablet by mouth twice daily as needed for anxiety  . aspirin 81 MG tablet Take 81 mg by mouth daily.  . Calcium Carbonate-Vitamin D (CALTRATE 600+D) 600-400 MG-UNIT per chew tablet Chew 1 tablet by mouth 2 (two) times daily.    . citalopram (CELEXA) 20 MG tablet Take 1 tablet (20 mg total) by mouth daily.  . Cyanocobalamin (VITAMIN B 12 PO) Take 100 mcg by mouth daily.  Marland Kitchen esomeprazole (NEXIUM) 40 MG capsule Take 1 capsule (40 mg total) by mouth daily at 12 noon.  . meloxicam (MOBIC) 7.5 MG tablet Take 1 tablet (7.5 mg total) by mouth daily as needed for pain.  . metoprolol tartrate (LOPRESSOR) 25 MG tablet Take 1 tablet (25 mg total) by mouth 2 (two) times daily.  . Multiple Vitamin (MULTIVITAMIN) tablet Take 1 tablet by mouth daily.     No facility-administered encounter medications on file as of 10/09/2020.    Wt Readings from  Last 3 Encounters:  09/20/20 148 lb (67.1 kg)  06/07/20 143 lb (64.9 kg)  02/28/20 142 lb 1.9 oz (64.5 kg)    Current Diagnosis/Assessment:    Goals Addressed   None    Hypertension   BP goal is:  {CHL HP [facility] Pharmacist BP ranges:[phone]}  Office blood pressures are  BP Readings from Last 3 Encounters:  09/20/20 110/70  06/07/20 110/60  02/28/20 110/68   Patient checks BP at home {CHL HP BP Monitoring Frequency:[phone]} Patient home BP readings are ranging: ***  Patient has failed these meds in the past: *** Patient is currently {CHL Controlled/[phone]} on the following medications:  Marland Kitchen Metoprolol tartrate 25 mg twice daily   We discussed {CHL HP [facility] Pharmacy discussion:[phone]}  Plan  Continue {CHL HP [facility] Pharmacy [phone]}   Hyperlipidemia   LDL goal < ***  Lipid Panel     Component Value Date/Time   CHOL 184 09/20/2020 1116   TRIG 85 09/20/2020 1116   TRIG 66 01/11/2007 1305   HDL 73 09/20/2020 1116   LDLCALC 93 09/20/2020 1116   LDLDIRECT 78.4 04/18/2010 1056    Hepatic Function Latest Ref Rng & Units 09/20/2020 09/20/2019 09/09/2018  Total Protein 6.1 - 8.1 g/dL 6.8 6.8 6.7  Albumin 3.5 - 5.2 g/dL - 4.0 3.9  AST 10 - 35 U/L 17 16 13   ALT 6 - 29 U/L 20 18 16   Alk Phosphatase 39 - 117 U/L - 122(H) 107  Total Bilirubin 0.2 - 1.2 mg/dL 0.6 0.7 0.8  Bilirubin, Direct 0.0 - 0.3  mg/dL - - -     The ASCVD Risk score Mikey Bussing DC Jr., et al., 2013) failed to calculate for the following reasons:   The 2013 ASCVD risk score is only valid for ages 23 to 61   Patient has failed these meds in past: *** Patient is currently {CHL Controlled/Uncontrolled:[phone]} on the following medications:  . Aspirin 81 mg daily   We discussed:  {CHL HP [facility] Pharmacy discussion:[phone]}  Plan  Continue {CHL HP [facility] Pharmacy [phone]}  Osteopenia / Osteoporosis   Last DEXA Scan: 05/16/15   T-Score femoral  neck: -1.6  T-Score total hip: n/a  T-Score lumbar spine: -1.7  T-Score forearm radius: n/a  10-year probability of major osteoporotic fracture: 5.7%  10-year probability of hip fracture: 1.3%  Vit D, 25-Hydroxy  Date Value Ref Range Status  04/18/2010 49 30 - 89 ng/mL Final    Comment:    See lab report for associated comment(s)     Patient is not a candidate for pharmacologic treatment  Patient has failed these meds in past: n/a Patient is currently controlled on the following medications:  . Calcium Carbonate + Vit D 600 mg -400 units twice daily   We discussed:  Recommend [phone] units of vitamin D daily. Recommend 1200 mg of calcium daily from dietary and supplemental sources.  Plan  Continue {CHL HP [facility] Pharmacy [phone]}  Depression / Anxiety   PHQ9 Score:  PHQ9 SCORE ONLY 09/20/2020 09/20/2019 09/09/2018  PHQ-9 Total Score 0 2 7   GAD7 Score: No flowsheet data found.  Patient has failed these meds in past: *** Patient is currently {CHL Controlled/Uncontrolled:[phone]} on the following medications:  . Alprazolam 1 mg twice daily PRN  . Citalopram 20 mg daily   We discussed:  ***  Plan  Continue {CHL HP [facility] Pharmacy [phone]}  GERD   Patient {Actions; denies-reports:120008::"denies"} {gerd assoc sx:31969:o:"dysphagia","heartburn","nausea"}. Expresses understanding to avoid triggers such as {causes; exacerbators GERD:13199}.  Currently {CHL Controlled/Uncontrolled:[phone]} on: . Esomeprazole 40 mg daily at noon  Plan   {rxplan:23810::"Continue current medication."}   Misc / OTC   Patient has failed these meds in past: *** Patient is currently {CHL Controlled/Uncontrolled:[phone]} on the following medications:  Marland Kitchen Vitamin B12 100 mcg daily . Meloxicam 7.5 mg daily PRN  . Multivitamin daily    We discussed:  ***  Plan  Continue {CHL HP [facility] Pharmacy KLKJZ:[id number]}   Medication Management   Pt uses ***  pharmacy for all medications Uses pill box? {Yes or If no, why not?:20788} Pt endorses ***% compliance  We discussed: {Pharmacy options:24294}  Plan  {US Pharmacy YIAX:65537}    Follow up: *** month phone visit  ***

## 2020-10-08 ENCOUNTER — Telehealth: Payer: Self-pay

## 2020-10-08 NOTE — Progress Notes (Signed)
Spoke to patient to confirmed patient office appointment on 10/09/2020 for CCM at 2:00pm with Junius Argyle the Clinical pharmacist.   Patient verbalized understanding  [place] Pharmacist Assistant [phone]

## 2020-10-09 ENCOUNTER — Ambulatory Visit: Payer: Medicare Other

## 2020-10-09 ENCOUNTER — Telehealth: Payer: Self-pay | Admitting: Adult Health

## 2020-10-09 NOTE — Progress Notes (Signed)
  Chronic Care Management   Note  10/09/2020 Name: [name] MRN: [id number] DOB: [birth year]  [name] is a 84 y.o. year old female who is a primary care patient of Roberta Peng, [name]. I reached out to [name] by phone today in response to a referral sent by [name] PCP, Roberta Peng, [name].   [name] was given information about Chronic Care Management services today including:  1. CCM service includes personalized support from designated clinical staff supervised by her physician, including individualized plan of care and coordination with other care providers 2. 24/7 contact phone numbers for assistance for urgent and routine care needs. 3. Service will only be billed when office clinical staff spend 20 minutes or more in a month to coordinate care. 4. Only one practitioner may furnish and bill the service in a calendar month. 5. The patient may stop CCM services at any time (effective at the end of the month) by phone call to the office staff.   Patient agreed to services and verbal consent obtained.   Follow up plan:   [name] [facility] Scheduler

## 2020-10-09 NOTE — Telephone Encounter (Signed)
The patient had to reschedule her appointment for today. She would like a call back to reschedule.

## 2020-10-16 ENCOUNTER — Telehealth: Payer: Self-pay

## 2020-10-16 NOTE — Progress Notes (Signed)
Left voice message  to confirmed patient office appointment on 10/17/2020 for CCM at 11:00 am with Junius Argyle the Clinical pharmacist.   [place] Pharmacist Assistant [phone]

## 2020-10-17 ENCOUNTER — Ambulatory Visit: Payer: Medicare Other

## 2020-10-17 ENCOUNTER — Other Ambulatory Visit: Payer: Self-pay

## 2020-10-17 DIAGNOSIS — E7800 Pure hypercholesterolemia, unspecified: Secondary | ICD-10-CM

## 2020-10-17 DIAGNOSIS — I10 Essential (primary) hypertension: Secondary | ICD-10-CM

## 2020-10-17 NOTE — Chronic Care Management (AMB) (Signed)
Chronic Care Management Pharmacy  Name: [name]  MRN: [id number] DOB: 1936-08-27   Chief Complaint/ HPI  Jamil [name],  84 y.o. , female presents for their Initial CCM visit with the clinical pharmacist In office.  PCP : Dorothyann Peng, NP Patient Care Team: Dorothyann Peng, NP as PCP - General (Family Medicine) Germaine Pomfret, [facility] as Pharmacist (Pharmacist)  Their chronic conditions include: Hypertension, Hyperlipidemia, GERD, Anxiety and Osteoporosis   Office Visits: 09/20/20: Patient presented to Dorothyann Peng, NP for follow-up. Sertraline, methylprednisolone stopped, citalopram 20 mg daily started.  06/07/20: Patient presented to Dorothyann Peng, NP for follow-up. Patient started on meloxicam, methylprednisolone for acute sciatica pain.   Consult Visit: 02/28/20: Patient presented to Dr. Johnsie Cancel (Cardiology) for follow-up. No medication changes made.   Allergies  Allergen Reactions  . Celebrex [Celecoxib] Rash    REACTION: rash    Medications: Outpatient Encounter Medications as of 10/17/2020  Medication Sig  . ALPRAZolam (XANAX) 1 MG tablet Take 1 tablet by mouth twice daily as needed for anxiety  . cholecalciferol (VITAMIN D3) 25 MCG (1000 UNIT) tablet Take 1,000 Units by mouth daily.  . citalopram (CELEXA) 20 MG tablet Take 1 tablet (20 mg total) by mouth daily.  . magnesium hydroxide (MILK OF MAGNESIA) 400 MG/5ML suspension Take 5 mLs by mouth daily as needed for mild constipation.  . meloxicam (MOBIC) 7.5 MG tablet Take 1 tablet (7.5 mg total) by mouth daily as needed for pain.  . metoprolol tartrate (LOPRESSOR) 25 MG tablet Take 1 tablet (25 mg total) by mouth 2 (two) times daily.  . Multiple Vitamin (MULTIVITAMIN) tablet Take 1 tablet by mouth daily.    Marland Kitchen POTASSIUM GLUCONATE PO Take 2 tablets by mouth.  . vitamin E 1000 UNIT capsule Take 1,000 Units by mouth daily.  Marland Kitchen aspirin 81 MG tablet Take 81 mg by mouth daily. (Patient not taking: Reported  on 10/17/2020)  . Calcium Carbonate-Vitamin D (CALTRATE 600+D) 600-400 MG-UNIT per chew tablet Chew 1 tablet by mouth 2 (two) times daily.   (Patient not taking: Reported on 10/17/2020)  . Cyanocobalamin (VITAMIN B 12 PO) Take 100 mcg by mouth daily. (Patient not taking: Reported on 10/17/2020)  . esomeprazole (NEXIUM) 40 MG capsule Take 1 capsule (40 mg total) by mouth daily at 12 noon. (Patient not taking: Reported on 10/17/2020)   No facility-administered encounter medications on file as of 10/17/2020.    Wt Readings from Last 3 Encounters:  09/20/20 148 lb (67.1 kg)  06/07/20 143 lb (64.9 kg)  02/28/20 142 lb 1.9 oz (64.5 kg)    Current Diagnosis/Assessment:  SDOH Interventions     Most Recent Value  SDOH Interventions  Financial Strain Interventions Intervention Not Indicated  Transportation Interventions Intervention Not Indicated      Goals Addressed            This Visit's Progress   . Chronic Care Management       CARE PLAN ENTRY (see longitudinal plan of care for additional care plan information)  Current Barriers:  . Chronic Disease Management support, education, and care coordination needs related to Hypertension, Hyperlipidemia, GERD, Anxiety and Osteoporosis    Hypertension BP Readings from Last 3 Encounters:  09/20/20 110/70  06/07/20 110/60  02/28/20 110/68   . Pharmacist Clinical Goal(s): o Over the next 90 days, patient will work with PharmD and providers to maintain BP goal <140/90 . Current regimen:  o None . Interventions: o Discussed low salt diet and exercising  as tolerated extensively o Will initiate blood pressure monitoring plan  . Patient self care activities - Over the next 90 days, patient will: o Ensure daily salt intake < 2300 mg/day  Hyperlipidemia Lab Results  Component Value Date/Time   LDLCALC 93 09/20/2020 11:16 AM   LDLDIRECT 78.4 04/18/2010 10:56 AM   . Pharmacist Clinical Goal(s): o Over the next 90 days, patient will  work with PharmD and providers to maintain LDL goal < 100 . Current regimen:  o None . Interventions: o Discussed low cholesterol diet and exercising as tolerated extensively o Will initiate cholesterol monitoring plan   Medication management . Pharmacist Clinical Goal(s): o Over the next 90 days, patient will work with PharmD and providers to maintain optimal medication adherence . Current pharmacy: [facility] . Interventions o Comprehensive medication review performed. o Continue current medication management strategy . Patient self care activities - Over the next 90 days, patient will: o Take medications as prescribed o Report any questions or concerns to PharmD and/or provider(s)      Hypertension   BP goal is:  <140/90  Office blood pressures are  BP Readings from Last 3 Encounters:  09/20/20 110/70  06/07/20 110/60  02/28/20 110/68   Patient checks BP at home Never Patient home BP readings are ranging: n/a  Patient has failed these meds in the past: n/a Patient is currently controlled on the following medications:  Marland Kitchen Metoprolol tartrate 25 mg twice daily   We discussed diet and exercise extensively  Plan  Continue current medications   Hyperlipidemia   LDL goal < 100  Lipid Panel     Component Value Date/Time   CHOL 184 09/20/2020 1116   TRIG 85 09/20/2020 1116   TRIG 66 01/11/2007 1305   HDL 73 09/20/2020 1116   LDLCALC 93 09/20/2020 1116   LDLDIRECT 78.4 04/18/2010 1056    Hepatic Function Latest Ref Rng & Units 09/20/2020 09/20/2019 09/09/2018  Total Protein 6.1 - 8.1 g/dL 6.8 6.8 6.7  Albumin 3.5 - 5.2 g/dL - 4.0 3.9  AST 10 - 35 U/L 17 16 13   ALT 6 - 29 U/L 20 18 16   Alk Phosphatase 39 - 117 U/L - 122(H) 107  Total Bilirubin 0.2 - 1.2 mg/dL 0.6 0.7 0.8  Bilirubin, Direct 0.0 - 0.3 mg/dL - - -     The ASCVD Risk score (Wayland., et al., 2013) failed to calculate for the following reasons:   The 2013 ASCVD risk score is only valid for ages 70  to 17   Patient has failed these meds in past: n/a Patient is currently controlled on the following medications:  . None  We discussed: Patient stopped aspirin due to stomach upset. Given patient's age and primary prevention indication, reasonable to discontinue entirely.  Plan  Recommend stopping aspirin (lack of strong indication, intolerable side effects)  Osteoporosis   Last DEXA Scan: 05/16/15   T-Score femoral neck: -1.6  T-Score total hip: n/a  T-Score lumbar spine: -1.7  T-Score forearm radius: n/a  10-year probability of major osteoporotic fracture: 5.7%  10-year probability of hip fracture: 1.3%  Vit D, 25-Hydroxy  Date Value Ref Range Status  04/18/2010 49 30 - 89 ng/mL Final    Comment:    See lab report for associated comment(s)     Patient is not a candidate for pharmacologic treatment  Patient has failed these meds in past: n/a Patient is currently controlled on the following medications:  Marland Kitchen Vitamin D daily  We discussed:  Recommend [phone] units of vitamin D daily. Recommend 1200 mg of calcium daily from dietary and supplemental sources.  Plan  Recommend starting calcium carbonate 500 mg twice daily. Recommend follow-up DEXA  Recommend rechecking vitamin D level.   Depression / Anxiety   PHQ9 Score:  PHQ9 SCORE ONLY 09/20/2020 09/20/2019 09/09/2018  PHQ-9 Total Score 0 2 7   GAD7 Score: No flowsheet data found.  Patient has failed these meds in past: Escitalopram, Paroxetine, sertraline, venlafaxine   Patient is currently uncontrolled on the following medications:  . Alprazolam 1 mg twice daily PRN (takes 1/2 tab QAM, 1 tab QHS)  . Citalopram 20 mg daily    We discussed: Patient has been taking citalopram for ~1 month and has not felt much difference. She has tolerated the medication well but does not feel like it has done much to help with her anxiety.  Overall she rates her sleep quality as good. She tends to sleep from 11:30 PM - 7: 30 AM.  Most night she wakes up 1-2 times nightly and is able to fall back asleep shortly afterwards.   Patient states she will need a refill of alprazolam soon.   Plan  Recommend stopping citalopram (ineffective)  GERD   Patient denies dysphagia, heartburn or nausea. Expresses understanding to avoid triggers such as citrus juices and lying down after eating.  Currently controlled on: . None  Plan   Recommend stopping esomeprazole (patient has not taken in >6 months)   Misc / OTC   . Vitamin B12 100 mcg daily . Meloxicam 7.5 mg daily PRN  . Multivitamin daily  (Centrum Silver)  . Vitamin D  . Vitamin E  . Milk of Magnesium PRN  . Zinc PRN  . Potassium 2  Tablets daily   Plan  Continue current medications  Vaccines   Reviewed and discussed patient's vaccination history.    Immunization History  Administered Date(s) Administered  . Fluad Quad(high Dose 65+) 09/20/2019, 09/20/2020  . Influenza Split 10/01/2011, 10/11/2012  . Influenza Whole 09/30/2008, 09/27/2009, 09/25/2010  . Influenza, High Dose Seasonal PF 09/27/2015, 09/12/2016, 09/24/2017, 09/09/2018  . Influenza,inj,Quad PF,6+ Mos 09/14/2013, 09/27/2014, 10/16/2014  . Moderna SARS-COVID-2 Vaccination 01/10/2020, 02/20/2020  . Pneumococcal Conjugate-13 03/21/2015  . Pneumococcal Polysaccharide-23 08/16/2014  . Tdap 08/13/2014    Plan  Recommended patient receive Covid vaccine booster.  Medication Management   Pt uses [place] pharmacy for all medications Uses pill box? No Pt endorses 100% compliance  We discussed: Discussed benefits of medication synchronization, packaging and delivery as well as enhanced pharmacist oversight with [facility].  Plan  Continue current medication management strategy  Follow up: 12 month phone visit  [place] Pharmacist [facility] [place]

## 2020-10-17 NOTE — Patient Instructions (Signed)
Visit Information It was great speaking with you today!  Please let me know if you have any questions about our visit.  Plan:  . I recommend starting Tylenol Arthritis 650 mg as needed for mild arthritis pain  . I recommend taking 1200 mg calcium daily for good bone health. Check your Vitamin C to check if it has enough calcium in it.   Doristine Section Clinical Pharmacist [facility] [place]

## 2020-10-20 ENCOUNTER — Other Ambulatory Visit: Payer: Self-pay | Admitting: Adult Health

## 2020-11-08 ENCOUNTER — Ambulatory Visit: Payer: Medicare Other

## 2020-12-04 ENCOUNTER — Ambulatory Visit: Payer: Medicare Other

## 2020-12-19 ENCOUNTER — Telehealth: Payer: Self-pay | Admitting: Adult Health

## 2020-12-19 NOTE — Telephone Encounter (Signed)
Left message for patient to call back to re- schedule Medicare Annual Wellness Visit (AWV) either virtually or in office.   Last AWVS 8/28/18please schedule at anytime with [facility] Nurse Health Advisor 1 or 2   This should be a 45 minute visit.

## 2020-12-19 NOTE — Telephone Encounter (Signed)
Patient is scheduled for her AWV on 01/12/ at 10:30 AM for a telephone visit

## 2020-12-24 ENCOUNTER — Other Ambulatory Visit: Payer: Self-pay | Admitting: Adult Health

## 2020-12-24 DIAGNOSIS — I471 Supraventricular tachycardia: Secondary | ICD-10-CM

## 2021-01-09 ENCOUNTER — Ambulatory Visit (INDEPENDENT_AMBULATORY_CARE_PROVIDER_SITE_OTHER): Payer: Medicare Other

## 2021-01-09 DIAGNOSIS — Z780 Asymptomatic menopausal state: Secondary | ICD-10-CM

## 2021-01-09 DIAGNOSIS — Z0000 Encounter for general adult medical examination without abnormal findings: Secondary | ICD-10-CM

## 2021-01-09 NOTE — Progress Notes (Addendum)
Subjective:   [name] is a 85 y.o. female who presents for Medicare Annual (Subsequent) preventive examination.  I connected with [name] today by telephone and verified that I am speaking with the correct person using two identifiers. Location patient: home Location provider: work Persons participating in the virtual visit: patient, provider.   I discussed the limitations, risks, security and privacy concerns of performing an evaluation and management service by telephone and the availability of in person appointments. I also discussed with the patient that there may be a patient responsible charge related to this service. The patient expressed understanding and verbally consented to this telephonic visit.    Interactive audio and video telecommunications were attempted between this provider and patient, however failed, due to patient having technical difficulties OR patient did not have access to video capability.  We continued and completed visit with audio only.      Review of Systems    N/A  Cardiac Risk Factors include: advanced age (>89men, >43 women);hypertension;dyslipidemia     Objective:    Today's Vitals   There is no height or weight on file to calculate BMI.  Advanced Directives 01/09/2021 08/06/2015 05/26/2015  Does Patient Have a Medical Advance Directive? Yes Yes No  Type of Paramedic of [place];Living will [place];Living will -  Does patient want to make changes to medical advance directive? No - Patient declined - -  Copy of [place] in Chart? No - copy requested - -  Would patient like information on creating a medical advance directive? - - No - patient declined information    Current Medications (verified) Outpatient Encounter Medications as of 01/09/2021  Medication Sig  . ALPRAZolam (XANAX) 1 MG tablet Take 1 tablet by mouth twice daily as needed for anxiety  . Ascorbic Acid  (VITAMIN C) 100 MG tablet Take 100 mg by mouth daily.  Marland Kitchen aspirin 81 MG tablet Take 81 mg by mouth daily.  . Calcium Carbonate-Vitamin D 600-400 MG-UNIT chew tablet Chew 1 tablet by mouth 2 (two) times daily.  . cholecalciferol (VITAMIN D3) 25 MCG (1000 UNIT) tablet Take 1,000 Units by mouth daily.  . citalopram (CELEXA) 20 MG tablet Take 1 tablet by mouth once daily  . Cyanocobalamin (VITAMIN B 12 PO) Take 100 mcg by mouth daily.  . meloxicam (MOBIC) 7.5 MG tablet Take 1 tablet (7.5 mg total) by mouth daily as needed for pain.  . metoprolol tartrate (LOPRESSOR) 25 MG tablet Take 1 tablet (25 mg total) by mouth 2 (two) times daily.  . Multiple Vitamin (MULTIVITAMIN) tablet Take 1 tablet by mouth daily.  Marland Kitchen POTASSIUM GLUCONATE PO Take 2 tablets by mouth.  . vitamin E 1000 UNIT capsule Take 1,000 Units by mouth daily.  Marland Kitchen zinc gluconate 50 MG tablet Take 50 mg by mouth daily.  Marland Kitchen esomeprazole (NEXIUM) 40 MG capsule Take 1 capsule (40 mg total) by mouth daily at 12 noon. (Patient not taking: No sig reported)  . magnesium hydroxide (MILK OF MAGNESIA) 400 MG/5ML suspension Take 5 mLs by mouth daily as needed for mild constipation. (Patient not taking: Reported on 01/09/2021)   No facility-administered encounter medications on file as of 01/09/2021.    Allergies (verified) Celebrex [celecoxib]   History: Past Medical History:  Diagnosis Date  . Anxiety state    unspecified  . ARTHRITIS 09/03/2010  . Asthma   . CHOLELITHIASIS 09/03/2010  . COLONIC POLYPS, HX OF 06/02/2008  . DIVERTICULAR  DISEASE 09/03/2010  . DYSPHAGIA UNSPECIFIED 09/03/2010  . ESSENTIAL HYPERTENSION 10/29/2010  . Fibromyalgia   . GALLSTONE PANCREATITIS 09/03/2010  . GERD 09/02/2007  . HEART MURMUR, HX OF 08/12/2010  . Hematuria   . Hiatal hernia   . Hx of colonoscopy    2014  . HYPERLIPIDEMIA 12/09/2007  . Insomnia   . Lichen simplex chronicus   . Low back pain    chronic  . Osteopenia   . OSTEOPOROSIS 05/17/2009  . Palpitations  09/03/2010   event monitor reveals long RP tachycardia, likely sinus tachycardia  . Paresthesia   . Polyuria   . RBBB 09/05/2010  . VENTRICULAR HYPERTROPHY, LEFT 08/12/2010  . VITAMIN B12 DEFICIENCY 09/02/2007  . WEIGHT LOSS, ABNORMAL 06/02/2008   Past Surgical History:  Procedure Laterality Date  . ABDOMINAL HYSTERECTOMY     THA and O  . APPENDECTOMY    . CHOLECYSTECTOMY    . ESOPHAGOGASTRODUODENOSCOPY     with HH and esophagitis  . OOPHORECTOMY    . SPINE SURGERY     cervical fusion   Family History  Problem Relation Age of Onset  . Heart disease Mother   . Stroke Mother   . Prostate cancer Father    Social History   Socioeconomic History  . Marital status: Widowed    Spouse name: Not on file  . Number of children: 6  . Years of education: Not on file  . Highest education level: Not on file  Occupational History  . Occupation: retired  Tobacco Use  . Smoking status: Never Smoker  . Smokeless tobacco: Never Used  Vaping Use  . Vaping Use: Never used  Substance and Sexual Activity  . Alcohol use: No    Alcohol/week: 0.0 standard drinks  . Drug use: No  . Sexual activity: Never  Other Topics Concern  . Not on file  Social History Narrative   Widowed   2 children   Lives in [place]   Retired from [employer] Strain: [name]   . Difficulty of Paying Living Expenses: Not hard at all  Food Insecurity: No Food Insecurity  . Worried About Charity fundraiser in the Last Year: Never true  . Ran Out of Food in the Last Year: Never true  Transportation Needs: No Transportation Needs  . Lack of Transportation (Medical): No  . Lack of Transportation (Non-Medical): No  Physical Activity: Inactive  . Days of Exercise per Week: 0 days  . Minutes of Exercise per Session: 0 min  Stress: No Stress Concern Present  . Feeling of Stress : Not at all  Social Connections: Moderately Integrated  . Frequency of Communication  with Friends and Family: More than three times a week  . Frequency of Social Gatherings with Friends and Family: Once a week  . Attends Religious Services: More than 4 times per year  . Active Member of Clubs or Organizations: Yes  . Attends Archivist Meetings: More than 4 times per year  . Marital Status: Widowed    Tobacco Counseling Counseling given: Not Answered   Clinical Intake:  Pre-visit preparation completed: Yes  Pain : No/denies pain     Nutritional Risks: None Diabetes: No  How often do you need to have someone help you when you read instructions, pamphlets, or other written materials from your doctor or pharmacy?: 1 - Never What is the last grade level you completed in school?: 12th grade  Diabetic?No  Interpreter Needed?: No  Information entered by :: [place] of Daily Living In your present state of health, do you have any difficulty performing the following activities: 01/09/2021  Hearing? N  Vision? N  Difficulty concentrating or making decisions? N  Walking or climbing stairs? N  Dressing or bathing? N  Doing errands, shopping? N  Preparing Food and eating ? N  Using the Toilet? N  In the past six months, have you accidently leaked urine? Y  Comment has occassional stress incontience  Do you have problems with loss of bowel control? N  Managing your Medications? N  Managing your Finances? N  Housekeeping or managing your Housekeeping? N  Some recent data might be hidden    Patient Care Team: Dorothyann Peng, NP as PCP - General (Family Medicine) Germaine Pomfret, [facility] as Pharmacist (Pharmacist)  Indicate any recent Medical Services you may have received from other than [facility] providers in the past year (date may be approximate).     Assessment:   This is a routine wellness examination for Maleeya.  Hearing/Vision screen  Hearing Screening   125Hz  250Hz  500Hz  1000Hz  2000Hz  3000Hz  4000Hz  6000Hz  8000Hz   Right ear:            Left ear:           Vision Screening Comments: Patient states gets eyes checked once per year. Patient has bilateral cataracts   Dietary issues and exercise activities discussed: Current Exercise Habits: The patient does not participate in regular exercise at present  Goals    . Chronic Care Management     CARE PLAN ENTRY (see longitudinal plan of care for additional care plan information)  Current Barriers:  . Chronic Disease Management support, education, and care coordination needs related to Hypertension, Hyperlipidemia, GERD, Anxiety and Osteoporosis    Hypertension BP Readings from Last 3 Encounters:  09/20/20 110/70  06/07/20 110/60  02/28/20 110/68   . Pharmacist Clinical Goal(s): o Over the next 90 days, patient will work with PharmD and providers to maintain BP goal <140/90 . Current regimen:  o None . Interventions: o Discussed low salt diet and exercising as tolerated extensively o Will initiate blood pressure monitoring plan  . Patient self care activities - Over the next 90 days, patient will: o Ensure daily salt intake < 2300 mg/day  Hyperlipidemia Lab Results  Component Value Date/Time   LDLCALC 93 09/20/2020 11:16 AM   LDLDIRECT 78.4 04/18/2010 10:56 AM   . Pharmacist Clinical Goal(s): o Over the next 90 days, patient will work with PharmD and providers to maintain LDL goal < 100 . Current regimen:  o None . Interventions: o Discussed low cholesterol diet and exercising as tolerated extensively o Will initiate cholesterol monitoring plan   Medication management . Pharmacist Clinical Goal(s): o Over the next 90 days, patient will work with PharmD and providers to maintain optimal medication adherence . Current pharmacy: [facility] . Interventions o Comprehensive medication review performed. o Continue current medication management strategy . Patient self care activities - Over the next 90 days, patient will: o Take medications as  prescribed o Report any questions or concerns to PharmD and/or provider(s)    . Exercise 3x per week (30 min per time)      Depression Screen PHQ 2/9 Scores 01/09/2021 09/20/2020 09/20/2019 09/09/2018 08/13/2016 04/16/2016 03/21/2015  PHQ - 2 Score 0 0 2 6 3 1  0  PHQ- 9 Score 0 - 2 7 10  - -  Fall Risk Fall Risk  01/09/2021 09/20/2020 09/20/2019 08/13/2016 04/16/2016  Falls in the past year? 1 0 0 No No  Number falls in past yr: 0 - - - -  Injury with Fall? 0 - - - -  Risk for fall due to : Impaired balance/gait - - - -  Follow up Falls evaluation completed;Falls prevention discussed - Falls evaluation completed - -    FALL RISK PREVENTION PERTAINING TO THE HOME:  Any stairs in or around the home? No  If so, are there any without handrails? No  Home free of loose throw rugs in walkways, pet beds, electrical cords, etc? Yes  Adequate lighting in your home to reduce risk of falls? Yes   ASSISTIVE DEVICES UTILIZED TO PREVENT FALLS:  Life alert? No  Use of a cane, walker or w/c? No  Grab bars in the bathroom? Yes  Shower chair or bench in shower? No  Elevated toilet seat or a handicapped toilet? No     Cognitive Function:        Immunizations Immunization History  Administered Date(s) Administered  . Fluad Quad(high Dose 65+) 09/20/2019, 09/20/2020  . Influenza Split 10/01/2011, 10/11/2012  . Influenza Whole 09/30/2008, 09/27/2009, 09/25/2010  . Influenza, High Dose Seasonal PF 09/27/2015, 09/12/2016, 09/24/2017, 09/09/2018  . Influenza,inj,Quad PF,6+ Mos 09/14/2013, 09/27/2014, 10/16/2014  . Moderna Sars-Covid-2 Vaccination 01/10/2020, 02/20/2020  . Pneumococcal Conjugate-13 03/21/2015  . Pneumococcal Polysaccharide-23 08/16/2014  . Tdap 08/13/2014    TDAP status: Up to date  Flu Vaccine status: Up to date  Pneumococcal vaccine status: Up to date  Covid-19 vaccine status: Completed vaccines  Qualifies for Shingles Vaccine? Yes   Zostavax completed No   Shingrix  Completed?: No.    Education has been provided regarding the importance of this vaccine. Patient has been advised to call insurance company to determine out of pocket expense if they have not yet received this vaccine. Advised may also receive vaccine at local pharmacy or Health Dept. Verbalized acceptance and understanding.  Screening Tests Health Maintenance  Topic Date Due  . HEMOGLOBIN A1C  03/10/2019  . COVID-19 Vaccine (3 - Booster for Moderna series) 08/19/2020  . TETANUS/TDAP  08/13/2024  . INFLUENZA VACCINE  Completed  . DEXA SCAN  Completed  . PNA vac Low Risk Adult  Completed  . FOOT EXAM  Discontinued  . OPHTHALMOLOGY EXAM  Discontinued  . URINE MICROALBUMIN  Discontinued    Health Maintenance  Health Maintenance Due  Topic Date Due  . HEMOGLOBIN A1C  03/10/2019  . COVID-19 Vaccine (3 - Booster for Moderna series) 08/19/2020    Colorectal cancer screening: No longer required.   Mammogram status: No longer required due to age.  Bone Density status: Completed 05/16/2015. Results reflect: Bone density results: OSTEOPENIA. Repeat every 5 years.  Lung Cancer Screening: (Low Dose CT Chest recommended if Age 34-80 years, 30 pack-year currently smoking OR have quit w/in 15years.) does not qualify.   Lung Cancer Screening Referral: N/A   Additional Screening:  Hepatitis C Screening: does not qualify;   Vision Screening: Recommended annual ophthalmology exams for early detection of glaucoma and other disorders of the eye. Is the patient up to date with their annual eye exam?  Yes  Who is the provider or what is the name of the office in which the patient attends annual eye exams? Dr. Gershon Crane  If pt is not established with a provider, would they like to be referred to a provider to establish care? No .  Dental Screening: Recommended annual dental exams for proper oral hygiene  Community Resource Referral / Chronic Care Management: CRR required this visit?  No   CCM  required this visit?  No      Plan:     I have personally reviewed and noted the following in the patient's chart:   . Medical and social history . Use of alcohol, tobacco or illicit drugs  . Current medications and supplements . Functional ability and status . Nutritional status . Physical activity . Advanced directives . List of other physicians . Hospitalizations, surgeries, and ER visits in previous 12 months . Vitals . Screenings to include cognitive, depression, and falls . Referrals and appointments  In addition, I have reviewed and discussed with patient certain preventive protocols, quality metrics, and best practice recommendations. A written personalized care plan for preventive services as well as general preventive health recommendations were provided to patient.     Ofilia Neas, LPN   [id number]   Nurse Notes: None

## 2021-01-09 NOTE — Patient Instructions (Signed)
[name] , Thank you for taking time to come for your Medicare Wellness Visit. I appreciate your ongoing commitment to your health goals. Please review the following plan we discussed and let me know if I can assist you in the future.   Screening recommendations/referrals: Colonoscopy: No longer required  Mammogram: Up to date next due 07/17/2021 Bone Density: Currently due orders placed this visit  Recommended yearly ophthalmology/optometry visit for glaucoma screening and checkup Recommended yearly dental visit for hygiene and checkup  Vaccinations: Influenza vaccine: Up to date, next due fall 2022  Pneumococcal vaccine: Completed series  Tdap vaccine: Up to date, next due 08/13/2024 Shingles vaccine: Currently due for Shingrix, if you wish to receive we recommend that you do so at your local pharmacy     Advanced directives: Please bring in copies of your advanced medical directives so that we may scan them into your chart   Conditions/risks identified: None   Next appointment: 01/10/2022 @ 10:30 am with [place] 65 Years and Older, Female Preventive care refers to lifestyle choices and visits with your health care provider that can promote health and wellness. What does preventive care include?  A yearly physical exam. This is also called an annual well check.  Dental exams once or twice a year.  Routine eye exams. Ask your health care provider how often you should have your eyes checked.  Personal lifestyle choices, including:  Daily care of your teeth and gums.  Regular physical activity.  Eating a healthy diet.  Avoiding tobacco and drug use.  Limiting alcohol use.  Practicing safe sex.  Taking low-dose aspirin every day.  Taking vitamin and mineral supplements as recommended by your health care provider. What happens during an annual well check? The services and screenings done by your health care provider during your annual  well check will depend on your age, overall health, lifestyle risk factors, and family history of disease. Counseling  Your health care provider may ask you questions about your:  Alcohol use.  Tobacco use.  Drug use.  Emotional well-being.  Home and relationship well-being.  Sexual activity.  Eating habits.  History of falls.  Memory and ability to understand (cognition).  Work and work Statistician.  Reproductive health. Screening  You may have the following tests or measurements:  Height, weight, and BMI.  Blood pressure.  Lipid and cholesterol levels. These may be checked every 5 years, or more frequently if you are over 58 years old.  Skin check.  Lung cancer screening. You may have this screening every year starting at age 39 if you have a 30-pack-year history of smoking and currently smoke or have quit within the past 15 years.  Fecal occult blood test (FOBT) of the stool. You may have this test every year starting at age 81.  Flexible sigmoidoscopy or colonoscopy. You may have a sigmoidoscopy every 5 years or a colonoscopy every 10 years starting at age 12.  Hepatitis C blood test.  Hepatitis B blood test.  Sexually transmitted disease (STD) testing.  Diabetes screening. This is done by checking your blood sugar (glucose) after you have not eaten for a while (fasting). You may have this done every 1-3 years.  Bone density scan. This is done to screen for osteoporosis. You may have this done starting at age 7.  Mammogram. This may be done every 1-2 years. Talk to your health care provider about how often you should have regular mammograms. Talk  with your health care provider about your test results, treatment options, and if necessary, the need for more tests. Vaccines  Your health care provider may recommend certain vaccines, such as:  Influenza vaccine. This is recommended every year.  Tetanus, diphtheria, and acellular pertussis (Tdap, Td) vaccine.  You may need a Td booster every 10 years.  Zoster vaccine. You may need this after age 13.  Pneumococcal 13-valent conjugate (PCV13) vaccine. One dose is recommended after age 56.  Pneumococcal polysaccharide (PPSV23) vaccine. One dose is recommended after age 34. Talk to your health care provider about which screenings and vaccines you need and how often you need them. This information is not intended to replace advice given to you by your health care provider. Make sure you discuss any questions you have with your health care provider. Document Released: 01/11/2016 Document Revised: 09/03/2016 Document Reviewed: 10/16/2015 Elsevier Interactive Patient Education  2017 [place] Prevention in the Home Falls can cause injuries. They can happen to people of all ages. There are many things you can do to make your home safe and to help prevent falls. What can I do on the outside of my home?  Regularly fix the edges of walkways and driveways and fix any cracks.  Remove anything that might make you trip as you walk through a door, such as a raised step or threshold.  Trim any bushes or trees on the path to your home.  Use bright outdoor lighting.  Clear any walking paths of anything that might make someone trip, such as rocks or tools.  Regularly check to see if handrails are loose or broken. Make sure that both sides of any steps have handrails.  Any raised decks and porches should have guardrails on the edges.  Have any leaves, snow, or ice cleared regularly.  Use sand or salt on walking paths during winter.  Clean up any spills in your garage right away. This includes oil or grease spills. What can I do in the bathroom?  Use night lights.  Install grab bars by the toilet and in the tub and shower. Do not use towel bars as grab bars.  Use non-skid mats or decals in the tub or shower.  If you need to sit down in the shower, use a plastic, non-slip stool.  Keep the  floor dry. Clean up any water that spills on the floor as soon as it happens.  Remove soap buildup in the tub or shower regularly.  Attach bath mats securely with double-sided non-slip rug tape.  Do not have throw rugs and other things on the floor that can make you trip. What can I do in the bedroom?  Use night lights.  Make sure that you have a light by your bed that is easy to reach.  Do not use any sheets or blankets that are too big for your bed. They should not hang down onto the floor.  Have a firm chair that has side arms. You can use this for support while you get dressed.  Do not have throw rugs and other things on the floor that can make you trip. What can I do in the kitchen?  Clean up any spills right away.  Avoid walking on wet floors.  Keep items that you use a lot in easy-to-reach places.  If you need to reach something above you, use a strong step stool that has a grab bar.  Keep electrical cords out of the way.  Do  not use floor polish or wax that makes floors slippery. If you must use wax, use non-skid floor wax.  Do not have throw rugs and other things on the floor that can make you trip. What can I do with my stairs?  Do not leave any items on the stairs.  Make sure that there are handrails on both sides of the stairs and use them. Fix handrails that are broken or loose. Make sure that handrails are as long as the stairways.  Check any carpeting to make sure that it is firmly attached to the stairs. Fix any carpet that is loose or worn.  Avoid having throw rugs at the top or bottom of the stairs. If you do have throw rugs, attach them to the floor with carpet tape.  Make sure that you have a light switch at the top of the stairs and the bottom of the stairs. If you do not have them, ask someone to add them for you. What else can I do to help prevent falls?  Wear shoes that:  Do not have high heels.  Have rubber bottoms.  Are comfortable and fit  you well.  Are closed at the toe. Do not wear sandals.  If you use a stepladder:  Make sure that it is fully opened. Do not climb a closed stepladder.  Make sure that both sides of the stepladder are locked into place.  Ask someone to hold it for you, if possible.  Clearly mark and make sure that you can see:  Any grab bars or handrails.  First and last steps.  Where the edge of each step is.  Use tools that help you move around (mobility aids) if they are needed. These include:  Canes.  Walkers.  Scooters.  Crutches.  Turn on the lights when you go into a dark area. Replace any light bulbs as soon as they burn out.  Set up your furniture so you have a clear path. Avoid moving your furniture around.  If any of your floors are uneven, fix them.  If there are any pets around you, be aware of where they are.  Review your medicines with your doctor. Some medicines can make you feel dizzy. This can increase your chance of falling. Ask your doctor what other things that you can do to help prevent falls. This information is not intended to replace advice given to you by your health care provider. Make sure you discuss any questions you have with your health care provider. Document Released: 10/11/2009 Document Revised: 05/22/2016 Document Reviewed: 01/19/2015 Elsevier Interactive Patient Education  2017 [employer].

## 2021-02-19 ENCOUNTER — Other Ambulatory Visit: Payer: Self-pay | Admitting: Adult Health

## 2021-02-20 NOTE — Progress Notes (Signed)
Patient ID: [name], female   DOB: Oct 29, 1936, 85 y.o.   MRN: [id number]     85 y.o. with history of benign palpitations since 2011.  History of RBBB/LAFB  and murmur of AV sclerosis Monitor at that time showed long RP tachycardia Rx with beta blocker Seen by Dr Rayann Heman EP no other Rx needed.  Sees Pulmonary for loculated left pleural effusion and 73mm lung nodule.  F/U CT September 2016 improved after 3 weeks Augmentin Rx  Widowed for 20 years but worries about her 6 kids/grand kids   All living in [place] RX anxiety with xanax and Zoloft  Some arthritis in her hands   She has had COVID vaccine   Still anxious on Celexa and Xanax  ROS: Denies fever, malais, weight loss, blurry vision, decreased visual acuity, cough, sputum, SOB, hemoptysis, pleuritic pain, palpitaitons, heartburn, abdominal pain, melena, lower extremity edema, claudication, or rash.  All other systems reviewed and negative  General: Anxious black female  Healthy:  appears younger than stated age 42: normal Neck supple with no adenopathy JVP normal no bruits no thyromegaly Lungs clear with no wheezing and good diaphragmatic motion Heart:  S1/S2 SEM  murmur, no rub, gallop or click PMI normal Abdomen: benighn, BS positve, no tenderness, no AAA no bruit.  No HSM or HJR Distal pulses intact with no bruits No edema Neuro non-focal Skin warm and dry No muscular weakness   Current Outpatient Medications  Medication Sig Dispense Refill  . acetaminophen (TYLENOL) 650 MG CR tablet Take 650 mg by mouth every 8 (eight) hours as needed for pain (arthritis).    . ALPRAZolam (XANAX) 1 MG tablet Take 1 tablet by mouth twice daily as needed for anxiety 60 tablet 2  . Ascorbic Acid (VITAMIN C) 100 MG tablet Take 100 mg by mouth daily.    Marland Kitchen aspirin 81 MG tablet Take 81 mg by mouth daily.    . Calcium Carbonate-Vitamin D 600-400 MG-UNIT chew tablet Chew 1 tablet by mouth 2 (two) times daily.    .  cholecalciferol (VITAMIN D3) 25 MCG (1000 UNIT) tablet Take 1,000 Units by mouth daily.    . citalopram (CELEXA) 20 MG tablet Take 1 tablet by mouth once daily 90 tablet 1  . Cyanocobalamin (VITAMIN B 12 PO) Take 100 mcg by mouth daily.    Marland Kitchen esomeprazole (NEXIUM) 40 MG capsule Take 1 capsule (40 mg total) by mouth daily at 12 noon. 30 capsule 3  . magnesium hydroxide (MILK OF MAGNESIA) 400 MG/5ML suspension Take 5 mLs by mouth daily as needed for mild constipation.    . meloxicam (MOBIC) 7.5 MG tablet Take 1 tablet (7.5 mg total) by mouth daily as needed for pain. 30 tablet 0  . metoprolol tartrate (LOPRESSOR) 25 MG tablet Take 1 tablet (25 mg total) by mouth 2 (two) times daily. 180 tablet 3  . Multiple Vitamin (MULTIVITAMIN) tablet Take 1 tablet by mouth daily.    Marland Kitchen POTASSIUM GLUCONATE PO Take 2 tablets by mouth.    . vitamin E 1000 UNIT capsule Take 1,000 Units by mouth daily.    Marland Kitchen zinc gluconate 50 MG tablet Take 50 mg by mouth daily.     No current facility-administered medications for this visit.    Allergies  Celebrex [celecoxib]  Electrocardiogram:   02/25/2021 SR LAFB/RBBB rate 62 no change   Assessment and Plan  SVT stable on beta blocker no need to entertain ablation at this point in time    Pulm:  quiescent CT  2016 with improved inflammatory changes   Anxiety: now on zoloft and xanax  f/u Dr Raliegh Ip   Murmur:   Benign SEM AV sclerosis no need for echo   Abnormal ECG: chronic bifascicular block no high grade AV block yearly ECG   F/U in a year   [name]

## 2021-02-25 ENCOUNTER — Other Ambulatory Visit: Payer: Self-pay

## 2021-02-25 ENCOUNTER — Ambulatory Visit (INDEPENDENT_AMBULATORY_CARE_PROVIDER_SITE_OTHER): Payer: Medicare Other | Admitting: Cardiovascular Disease

## 2021-02-25 ENCOUNTER — Encounter: Payer: Self-pay | Admitting: Cardiovascular Disease

## 2021-02-25 VITALS — BP 112/62 | HR 62 | Ht 66.0 in | Wt 146.2 lb

## 2021-02-25 DIAGNOSIS — I471 Supraventricular tachycardia, unspecified: Secondary | ICD-10-CM

## 2021-02-25 DIAGNOSIS — R9431 Abnormal electrocardiogram [ECG] [EKG]: Secondary | ICD-10-CM

## 2021-02-25 NOTE — Patient Instructions (Addendum)

## 2021-03-27 ENCOUNTER — Other Ambulatory Visit: Payer: Self-pay | Admitting: Cardiovascular Disease

## 2021-04-02 ENCOUNTER — Telehealth: Payer: Self-pay | Admitting: Pharmacist

## 2021-04-02 NOTE — Chronic Care Management (AMB) (Signed)
    Chronic Care Management Pharmacy Assistant   Name: [name]  MRN: [id number] DOB: 1936/12/17  Reason for Encounter: General Adherence Call   Recent office visits:  . 01.12.2022 Medicare Wellness Exam  Recent consult visits:  . 02.28.2022 Josue Hector, MD Cardiology  Hospital visits:  None in previous 6 months  Medications: Outpatient Encounter Medications as of 04/02/2021  Medication Sig  . acetaminophen (TYLENOL) 650 MG CR tablet Take 650 mg by mouth every 8 (eight) hours as needed for pain (arthritis).  . ALPRAZolam (XANAX) 1 MG tablet Take 1 tablet by mouth twice daily as needed for anxiety  . Ascorbic Acid (VITAMIN C) 100 MG tablet Take 100 mg by mouth daily.  Marland Kitchen aspirin 81 MG tablet Take 81 mg by mouth daily.  . Calcium Carbonate-Vitamin D 600-400 MG-UNIT chew tablet Chew 1 tablet by mouth 2 (two) times daily.  . cholecalciferol (VITAMIN D3) 25 MCG (1000 UNIT) tablet Take 1,000 Units by mouth daily.  . citalopram (CELEXA) 20 MG tablet Take 1 tablet by mouth once daily  . Cyanocobalamin (VITAMIN B 12 PO) Take 100 mcg by mouth daily.  Marland Kitchen esomeprazole (NEXIUM) 40 MG capsule Take 1 capsule (40 mg total) by mouth daily at 12 noon.  . magnesium hydroxide (MILK OF MAGNESIA) 400 MG/5ML suspension Take 5 mLs by mouth daily as needed for mild constipation.  . meloxicam (MOBIC) 7.5 MG tablet Take 1 tablet (7.5 mg total) by mouth daily as needed for pain.  . metoprolol tartrate (LOPRESSOR) 25 MG tablet Take 1 tablet by mouth twice daily  . Multiple Vitamin (MULTIVITAMIN) tablet Take 1 tablet by mouth daily.  Marland Kitchen POTASSIUM GLUCONATE PO Take 2 tablets by mouth.  . vitamin E 1000 UNIT capsule Take 1,000 Units by mouth daily.  Marland Kitchen zinc gluconate 50 MG tablet Take 50 mg by mouth daily.   No facility-administered encounter medications on file as of 04/02/2021.   I spoke with the patient and discussed medication adherence. There are no issues with her current medications. She recently  followed up with her cardiologist and received her medication refills. She states that she has been doing well lately. She has not had any falls in the past year. She states that she still has some back issues. There are no side effects from her current medications. She denies any head visits from her last CPP or PCP follow-up. She's having no current issues with her pharmacy.  Maia Breslow, [place] [phone]

## 2021-04-25 DIAGNOSIS — Z23 Encounter for immunization: Secondary | ICD-10-CM | POA: Diagnosis not present

## 2021-06-01 ENCOUNTER — Emergency Department (HOSPITAL_COMMUNITY)
Admission: EM | Admit: 2021-06-01 | Discharge: 2021-06-01 | Disposition: A | Discharge status: Home / Self Care | Payer: Medicare Other | Attending: Emergency Medicine | Admitting: Emergency Medicine

## 2021-06-01 ENCOUNTER — Emergency Department (HOSPITAL_COMMUNITY): Payer: Medicare Other

## 2021-06-01 ENCOUNTER — Encounter (HOSPITAL_COMMUNITY): Payer: Self-pay | Admitting: *Deleted

## 2021-06-01 DIAGNOSIS — M545 Low back pain, unspecified: Secondary | ICD-10-CM | POA: Diagnosis not present

## 2021-06-01 DIAGNOSIS — G319 Degenerative disease of nervous system, unspecified: Secondary | ICD-10-CM | POA: Diagnosis not present

## 2021-06-01 DIAGNOSIS — W010XXA Fall on same level from slipping, tripping and stumbling without subsequent striking against object, initial encounter: Secondary | ICD-10-CM | POA: Diagnosis not present

## 2021-06-01 DIAGNOSIS — Z7982 Long term (current) use of aspirin: Secondary | ICD-10-CM | POA: Diagnosis not present

## 2021-06-01 DIAGNOSIS — Z79899 Other long term (current) drug therapy: Secondary | ICD-10-CM | POA: Insufficient documentation

## 2021-06-01 DIAGNOSIS — I10 Essential (primary) hypertension: Secondary | ICD-10-CM | POA: Insufficient documentation

## 2021-06-01 DIAGNOSIS — T07XXXA Unspecified multiple injuries, initial encounter: Secondary | ICD-10-CM

## 2021-06-01 DIAGNOSIS — S8002XA Contusion of left knee, initial encounter: Secondary | ICD-10-CM | POA: Insufficient documentation

## 2021-06-01 DIAGNOSIS — R519 Headache, unspecified: Secondary | ICD-10-CM | POA: Insufficient documentation

## 2021-06-01 DIAGNOSIS — J323 Chronic sphenoidal sinusitis: Secondary | ICD-10-CM | POA: Diagnosis not present

## 2021-06-01 DIAGNOSIS — W19XXXA Unspecified fall, initial encounter: Secondary | ICD-10-CM

## 2021-06-01 DIAGNOSIS — M1712 Unilateral primary osteoarthritis, left knee: Secondary | ICD-10-CM | POA: Diagnosis not present

## 2021-06-01 DIAGNOSIS — S80912A Unspecified superficial injury of left knee, initial encounter: Secondary | ICD-10-CM | POA: Diagnosis present

## 2021-06-01 DIAGNOSIS — J45909 Unspecified asthma, uncomplicated: Secondary | ICD-10-CM | POA: Diagnosis not present

## 2021-06-01 DIAGNOSIS — S300XXA Contusion of lower back and pelvis, initial encounter: Secondary | ICD-10-CM | POA: Diagnosis not present

## 2021-06-01 DIAGNOSIS — S8992XA Unspecified injury of left lower leg, initial encounter: Secondary | ICD-10-CM | POA: Diagnosis not present

## 2021-06-01 MED ORDER — ACETAMINOPHEN 325 MG PO TABS
650.0000 mg | ORAL_TABLET | Freq: Once | ORAL | Status: AC
  Administered 2021-06-01: 650 mg via ORAL
  Filled 2021-06-01: qty 2

## 2021-06-01 MED ORDER — IBUPROFEN 400 MG PO TABS
400.0000 mg | ORAL_TABLET | Freq: Once | ORAL | Status: AC
  Administered 2021-06-01: 400 mg via ORAL
  Filled 2021-06-01: qty 1

## 2021-06-01 NOTE — Discharge Instructions (Addendum)
You are seen in the ER after you had a fall yesterday.  The CT scan of your brain and the x-ray of your legs are not indicative of any fractures or internal bleeding.  You might have pain for the next 2 or 3 days.  Take Tylenol for pain control and be very careful with walking.

## 2021-06-01 NOTE — ED Triage Notes (Signed)
Tripped over a rug yesterday, pain in left knee and swelling upper lip.

## 2021-06-01 NOTE — ED Notes (Signed)
Pt also hit her head and has swelling to right upper lip.  Pt is not on blood thinners.

## 2021-06-01 NOTE — ED Triage Notes (Signed)
Also has pain in back

## 2021-06-01 NOTE — ED Provider Notes (Signed)
[facility] EMERGENCY DEPARTMENT Provider Note   CSN: [id number] Arrival date & time: 06/01/21  1232     History Chief Complaint  Patient presents with  . Fall    [name] is a 85 y.o. female.  HPI    85 year old female comes in with chief complaint of fall. Patient has history of hypertension, hyperlipidemia, SVT.  She reports tripping over a rug yesterday and falling forward, striking her face.  She is also having pain in her knee.  She was very stiff today and had pain in her knee, lumbar spine making it hard for her to get up and walk, prompting the family to send her to the ER.  She has a headache, little bit of discomfort over the face but denies any nausea, vomiting, numbness, tingling, slurred speech, confusion.  Past Medical History:  Diagnosis Date  . Anxiety state    unspecified  . ARTHRITIS 09/03/2010  . Asthma   . CHOLELITHIASIS 09/03/2010  . COLONIC POLYPS, HX OF 06/02/2008  . DIVERTICULAR DISEASE 09/03/2010  . DYSPHAGIA UNSPECIFIED 09/03/2010  . ESSENTIAL HYPERTENSION 10/29/2010  . Fibromyalgia   . GALLSTONE PANCREATITIS 09/03/2010  . GERD 09/02/2007  . HEART MURMUR, HX OF 08/12/2010  . Hematuria   . Hiatal hernia   . Hx of colonoscopy    2014  . HYPERLIPIDEMIA 12/09/2007  . Insomnia   . Lichen simplex chronicus   . Low back pain    chronic  . Osteopenia   . OSTEOPOROSIS 05/17/2009  . Palpitations 09/03/2010   event monitor reveals long RP tachycardia, likely sinus tachycardia  . Paresthesia   . Polyuria   . RBBB 09/05/2010  . VENTRICULAR HYPERTROPHY, LEFT 08/12/2010  . VITAMIN B12 DEFICIENCY 09/02/2007  . WEIGHT LOSS, ABNORMAL 06/02/2008    Patient Active Problem List   Diagnosis Date Noted  . SVT (supraventricular tachycardia) ([place]) 03/13/2019  . Impaired glucose tolerance 11/26/2015  . Lung nodule 06/22/2015  . Thyroid nodule, uninodular, isthmus 08/01/2014  . Bifascicular block 05/12/2011  . Hiatal hernia   . Essential hypertension 10/29/2010  .  RBBB 09/05/2010  . DIVERTICULAR DISEASE 09/03/2010  . VENTRICULAR HYPERTROPHY, LEFT 08/12/2010  . Insomnia 08/12/2010  . HEART MURMUR, HX OF 08/12/2010  . LICHEN SIMPLEX CHRONICUS 01/17/2010  . Osteoporosis 05/17/2009  . Generalized anxiety disorder 08/30/2008  . Personal hx of colon adenomas 06/02/2008  . Hyperlipidemia 12/09/2007  . LOW BACK PAIN 12/09/2007  . B12 deficiency 09/02/2007  . GERD 09/02/2007  . Asthma 08/10/2007    Past Surgical History:  Procedure Laterality Date  . ABDOMINAL HYSTERECTOMY     THA and O  . APPENDECTOMY    . CHOLECYSTECTOMY    . ESOPHAGOGASTRODUODENOSCOPY     with HH and esophagitis  . OOPHORECTOMY    . SPINE SURGERY     cervical fusion     OB History   No obstetric history on file.     Family History  Problem Relation Age of Onset  . Heart disease Mother   . Stroke Mother   . Prostate cancer Father     Social History   Tobacco Use  . Smoking status: Never Smoker  . Smokeless tobacco: Never Used  Vaping Use  . Vaping Use: Never used  Substance Use Topics  . Alcohol use: No    Alcohol/week: 0.0 standard drinks  . Drug use: No    Home Medications Prior to Admission medications   Medication Sig Start Date End Date Taking? Authorizing Provider  acetaminophen (TYLENOL) 650 MG CR tablet Take 650 mg by mouth every 8 (eight) hours as needed for pain (arthritis).    [provider]  ALPRAZolam Duanne Moron) 1 MG tablet Take 1 tablet by mouth twice daily as needed for anxiety 02/20/21   Nafziger, Tommi Rumps, NP  Ascorbic Acid (VITAMIN C) 100 MG tablet Take 100 mg by mouth daily.    [provider]  aspirin 81 MG tablet Take 81 mg by mouth daily.    [provider]  Calcium Carbonate-Vitamin D 600-400 MG-UNIT chew tablet Chew 1 tablet by mouth 2 (two) times daily.    [provider]  cholecalciferol (VITAMIN D3) 25 MCG (1000 UNIT) tablet Take 1,000 Units by mouth daily.    [provider]  citalopram  (CELEXA) 20 MG tablet Take 1 tablet by mouth once daily 12/25/20   Nafziger, Tommi Rumps, NP  Cyanocobalamin (VITAMIN B 12 PO) Take 100 mcg by mouth daily.    [provider]  esomeprazole (NEXIUM) 40 MG capsule Take 1 capsule (40 mg total) by mouth daily at 12 noon. 09/09/18   Marletta Lor, MD  magnesium hydroxide (MILK OF MAGNESIA) 400 MG/5ML suspension Take 5 mLs by mouth daily as needed for mild constipation.    [provider]  meloxicam (MOBIC) 7.5 MG tablet Take 1 tablet (7.5 mg total) by mouth daily as needed for pain. 06/07/20   Nafziger, Tommi Rumps, NP  metoprolol tartrate (LOPRESSOR) 25 MG tablet Take 1 tablet by mouth twice daily 03/28/21   Josue Hector, MD  Multiple Vitamin (MULTIVITAMIN) tablet Take 1 tablet by mouth daily.    [provider]  POTASSIUM GLUCONATE PO Take 2 tablets by mouth.    [provider]  vitamin E 1000 UNIT capsule Take 1,000 Units by mouth daily.    [provider]  zinc gluconate 50 MG tablet Take 50 mg by mouth daily.    [provider]    Allergies    Celebrex [celecoxib]  Review of Systems   Review of Systems  Constitutional: Positive for activity change.  Respiratory: Negative for shortness of breath.   Cardiovascular: Negative for chest pain.  Musculoskeletal: Positive for arthralgias.  Skin: Negative for rash.  Neurological: Positive for headaches.  Hematological: Does not bruise/bleed easily.  All other systems reviewed and are negative.   Physical Exam Updated Vital Signs BP 126/62 (BP Location: Right Arm)   Pulse (!) 58   Temp 98 F (36.7 C)   Resp 16   Ht 5\' 6"  (1.676 m)   Wt 64.9 kg   SpO2 100%   BMI 23.08 kg/m   Physical Exam Vitals and nursing note reviewed.  Constitutional:      Appearance: She is well-developed.  HENT:     Head: Normocephalic and atraumatic.  Eyes:     Extraocular Movements: Extraocular movements intact.     Pupils: Pupils are equal, round, and  reactive to light.  Cardiovascular:     Rate and Rhythm: Normal rate.  Pulmonary:     Effort: Pulmonary effort is normal.  Abdominal:     General: Bowel sounds are normal.  Musculoskeletal:        General: Swelling and tenderness present.     Cervical back: Normal range of motion and neck supple.     Comments: Patient has edema over the left knee and tenderness in that region. She also has focal tenderness over the lumbar spine  Skin:    General: Skin is warm  and dry.  Neurological:     Mental Status: She is alert and oriented to person, place, and time.     ED Results / Procedures / Treatments   Labs (all labs ordered are listed, but only abnormal results are displayed) Labs Reviewed - No data to display  EKG None  Radiology DG Lumbar Spine Complete  Result Date: 06/01/2021 CLINICAL DATA:  Tripped over a rug yesterday, LEFT knee and back pain, initial encounter EXAM: LUMBAR SPINE - COMPLETE 4+ VIEW COMPARISON:  None FINDINGS: Five non-rib-bearing lumbar vertebra. Multilevel facet degenerative changes lower lumbar spine. Diffuse osseous demineralization. Disc space narrowing and endplate spur formation L3-L4, L4-L5, L5-S1. Vertebral body heights maintained. No fracture or bone destruction. Minimal anterolisthesis L3-L4. No spondylolysis. Hip and SI joints preserved. IMPRESSION: Degenerative disc and facet disease changes of mid to lower lumbar spine. Osseous demineralization. No acute abnormalities. Electronically Signed   By: Lavonia Dana M.D.   On: 06/01/2021 14:03   CT Head Wo Contrast  Result Date: 06/01/2021 CLINICAL DATA:  Pain following fall EXAM: CT HEAD WITHOUT CONTRAST TECHNIQUE: Contiguous axial images were obtained from the base of the skull through the vertex without intravenous contrast. COMPARISON:  None. FINDINGS: Brain: There is mild diffuse atrophy. There is no intracranial mass, hemorrhage, extra-axial fluid collection, or midline shift. There is slight decreased  attenuation in portions of the centra semiovale bilaterally. Elsewhere brain parenchyma appears unremarkable. No evident acute infarct. Vascular: No hyperdense vessel. There is mild calcification in each carotid siphon region. Skull: The bony calvarium appears intact. There is an apparent sebaceous cyst in the left frontal scalp region measuring 1.0 x 0.5 cm. Sinuses/Orbits: There is opacification in the superior right maxillary antrum. There is mild mucosal thickening in the superior left maxillary antrum. There is opacification in multiple ethmoid air cells. There is opacification throughout much of the visualized right sphenoid sinus. Visualized orbits appear symmetric bilaterally. Other: Mastoid air cells are clear. IMPRESSION: Mild atrophy with slight periventricular small vessel disease. No mass, hemorrhage, or extra-axial fluid collection. No appreciable acute infarct. Slight arterial vascular calcification. Multifocal paranasal sinus disease noted. Electronically Signed   By: Lowella Grip III M.D.   On: 06/01/2021 13:50   DG Knee Complete 4 Views Left  Result Date: 06/01/2021 CLINICAL DATA:  Tripped over a rug yesterday, LEFT knee and back pain, initial encounter EXAM: LEFT KNEE - COMPLETE 4+ VIEW COMPARISON:  None FINDINGS: Osseous demineralization. Joint space narrowing and spur formation greatest at medial compartment. No fracture, dislocation, or bone destruction. No joint effusion. IMPRESSION: Osseous demineralization and degenerative changes of LEFT knee. No acute osseous abnormalities. Electronically Signed   By: Lavonia Dana M.D.   On: 06/01/2021 14:04    Procedures Procedures   Medications Ordered in ED Medications  ibuprofen (ADVIL) tablet 400 mg (400 mg Oral Given 06/01/21 1407)  acetaminophen (TYLENOL) tablet 650 mg (650 mg Oral Given 06/01/21 1407)    ED Course  I have reviewed the triage vital signs and the nursing notes.  Pertinent labs & imaging results that were available  during my care of the patient were reviewed by me and considered in my medical decision making (see chart for details).    MDM Rules/Calculators/A&P                         85 year old comes with a chief complaint of fall.  Fall was yesterday, patient is having pain in her back, legs and swelling,  but discomfort to her face.  No trismus.  EOMI, no diplopia.   CT head ordered to ensure there was no subdural hematoma, it is negative.  X-rays of the knee and spine ordered given her pain from the trauma, they 2 are negative.  We will treat conservatively with RICE treatment.   Final Clinical Impression(s) / ED Diagnoses Final diagnoses:  Fall, initial encounter  Multiple contusions    Rx / DC Orders ED Discharge Orders    None       Varney Biles, MD 06/01/21 1519

## 2021-06-11 ENCOUNTER — Other Ambulatory Visit: Payer: Self-pay | Admitting: Adult Health

## 2021-06-11 DIAGNOSIS — Z1231 Encounter for screening mammogram for malignant neoplasm of breast: Secondary | ICD-10-CM

## 2021-06-20 ENCOUNTER — Other Ambulatory Visit: Payer: Self-pay | Admitting: Adult Health

## 2021-06-25 NOTE — Telephone Encounter (Signed)
Okay for refill?    LOV 09/20/20 CPE  Last Refill  02/20/21  60  QTY.  2 Refills

## 2021-06-29 ENCOUNTER — Other Ambulatory Visit: Payer: Self-pay | Admitting: Adult Health

## 2021-06-29 DIAGNOSIS — I471 Supraventricular tachycardia: Secondary | ICD-10-CM

## 2021-07-10 ENCOUNTER — Other Ambulatory Visit: Payer: Self-pay

## 2021-07-10 DIAGNOSIS — I471 Supraventricular tachycardia: Secondary | ICD-10-CM

## 2021-07-10 MED ORDER — CITALOPRAM HYDROBROMIDE 20 MG PO TABS: 20.0000 mg | ORAL_TABLET | Freq: Every day | ORAL | 0 refills | Status: DC

## 2021-08-05 ENCOUNTER — Other Ambulatory Visit: Payer: Self-pay

## 2021-08-05 ENCOUNTER — Ambulatory Visit
Admission: RE | Admit: 2021-08-05 | Discharge: 2021-08-05 | Disposition: A | Discharge status: Home / Self Care | Payer: Medicare Other | Source: Ambulatory Visit | Attending: Adult Health | Admitting: Adult Health

## 2021-08-05 DIAGNOSIS — Z1231 Encounter for screening mammogram for malignant neoplasm of breast: Secondary | ICD-10-CM

## 2021-09-13 ENCOUNTER — Telehealth: Payer: Self-pay | Admitting: Pharmacist

## 2021-09-13 NOTE — Chronic Care Management (AMB) (Signed)
Chronic Care Management Pharmacy Assistant   Name: [name]  MRN: [id number] DOB: October 26, 1936   Reason for Encounter: Disease State/ Hypertension Assessment Call.   Conditions to be addressed/monitored: HTN   Recent office visits:  04/25/21 [name] [facility] Medicine) - encounter for covid-19 immunization. No medication changes or follow up needed.  Recent consult visits:  None.  Hospital visits:  Medication Reconciliation was completed by comparing discharge summary, patient's EMR and Pharmacy list, and upon discussion with patient.  Patient visited [facility] emergency department on 06/01/21 for 3 hours due to a fall.  New? Medications Started at [facility] Discharge:?? -started None.  Medication Changes at Hospital Discharge: -Changed None.  Medications Discontinued at Hospital Discharge: -Stopped None.  Medications that remain the same after Hospital Discharge:??  -All other medications will remain the same.    Medications: Outpatient Encounter Medications as of 09/13/2021  Medication Sig   acetaminophen (TYLENOL) 650 MG CR tablet Take 650 mg by mouth every 8 (eight) hours as needed for pain (arthritis).   ALPRAZolam (XANAX) 1 MG tablet Take 1 tablet by mouth twice daily as needed for anxiety   Ascorbic Acid (VITAMIN C) 100 MG tablet Take 100 mg by mouth daily.   aspirin 81 MG tablet Take 81 mg by mouth daily.   Calcium Carbonate-Vitamin D 600-400 MG-UNIT chew tablet Chew 1 tablet by mouth 2 (two) times daily.   cholecalciferol (VITAMIN D3) 25 MCG (1000 UNIT) tablet Take 1,000 Units by mouth daily.   citalopram (CELEXA) 20 MG tablet Take 1 tablet (20 mg total) by mouth daily.   Cyanocobalamin (VITAMIN B 12 PO) Take 100 mcg by mouth daily.   esomeprazole (NEXIUM) 40 MG capsule Take 1 capsule (40 mg total) by mouth daily at 12 noon.   magnesium hydroxide (MILK OF MAGNESIA) 400 MG/5ML suspension Take 5 mLs by mouth daily as needed for mild constipation.    meloxicam (MOBIC) 7.5 MG tablet Take 1 tablet (7.5 mg total) by mouth daily as needed for pain.   metoprolol tartrate (LOPRESSOR) 25 MG tablet Take 1 tablet by mouth twice daily   Multiple Vitamin (MULTIVITAMIN) tablet Take 1 tablet by mouth daily.   POTASSIUM GLUCONATE PO Take 2 tablets by mouth.   vitamin E 1000 UNIT capsule Take 1,000 Units by mouth daily.   zinc gluconate 50 MG tablet Take 50 mg by mouth daily.   No facility-administered encounter medications on file as of 09/13/2021.   Fill History: ALPRAZolam '1MG'$       TAB 08/02/2021 30   CITALOPRAM '20MG'$      TAB 07/02/2021 90   METOPROLOL TARTRATE '25MG'$  TAB 07/04/2021 90   Reviewed chart prior to disease state call. Spoke with patient regarding BP  Recent Office Vitals: BP Readings from Last 3 Encounters:  06/01/21 126/62  02/25/21 112/62  09/20/20 110/70   Pulse Readings from Last 3 Encounters:  06/01/21 (!) 58  02/25/21 62  09/20/20 87    Wt Readings from Last 3 Encounters:  06/01/21 143 lb (64.9 kg)  02/25/21 146 lb 3.2 oz (66.3 kg)  09/20/20 148 lb (67.1 kg)     Kidney Function Lab Results  Component Value Date/Time   CREATININE 0.50 (L) 09/20/2020 11:16 AM   CREATININE 0.48 09/20/2019 11:19 AM   CREATININE 0.58 09/09/2018 08:59 AM   GFR 149.94 09/20/2019 11:19 AM   GFRNONAA 90 09/20/2020 11:16 AM   GFRAA 104 09/20/2020 11:16 AM    BMP Latest Ref Rng & Units 09/20/2020  09/20/2019 09/09/2018  Glucose 65 - 99 mg/dL 91 93 89  BUN 7 - 25 mg/dL '10 11 13  '$ Creatinine 0.60 - 0.88 mg/dL 0.50(L) 0.48 0.58  BUN/Creat Ratio 6 - 22 (calc) 20 - -  Sodium 135 - 146 mmol/L 139 139 138  Potassium 3.5 - 5.3 mmol/L 4.8 3.6 3.5  Chloride 98 - 110 mmol/L 105 102 102  CO2 20 - 32 mmol/L '28 31 29  '$ Calcium 8.6 - 10.4 mg/dL 9.5 9.6 9.1    Current antihypertensive regimen:  Metoprolol '25mg'$  - take 1 tablet by mouth twice daily.  How often are you checking your Blood Pressure? weekly Current home BP readings: 115/57 on 09/06/21 and  pulse was 62. 113/59 on 09/07/21 and pulse was 62.  What recent interventions/DTPs have been made by any provider to improve Blood Pressure control since last CPP Visit: None. Any recent hospitalizations or ED visits since last visit with CPP? Yes  Adherence Review: Is the patient currently on ACE/ARB medication? No Does the patient have >5 day gap between last estimated fill dates? No  Notes: Spoke with patient and reviewed all medications as prescribed. Patient reports taking all medications and no issues at this time. Patient stated she does check her blood pressure at home and it runs normal. Patient reports no symptoms of hypo or hypertension. Patient eats what she wants and she does add salt to taste. Patient only eats red meat once ina  while and mostly eats chicken and veggies. Patient drinks a lot of water and likes to have a sprite. Patient walks daily and lives alone so she does everything for herself at home. Patient also goes to an exercise class at the [facility] twice a week. Patient did not state a reason but did not wish to reschedule her CPP visit at this time.  Care Gaps:  AWV - scheduled fro 01/10/22 Zoster vaccines - never done Hemoglobin A1C - overdue sicne 03/10/2019   Star Rating Drugs:  None.  [place]  Clinical Pharmacist Assistant [phone]

## 2021-09-23 ENCOUNTER — Other Ambulatory Visit: Payer: Self-pay

## 2021-09-24 ENCOUNTER — Ambulatory Visit (INDEPENDENT_AMBULATORY_CARE_PROVIDER_SITE_OTHER): Payer: Medicare Other | Admitting: Adult Health

## 2021-09-24 ENCOUNTER — Encounter: Payer: Self-pay | Admitting: Adult Health

## 2021-09-24 VITALS — BP 120/60 | HR 59 | Temp 98.1°F | Ht 65.0 in | Wt 145.0 lb

## 2021-09-24 DIAGNOSIS — G8929 Other chronic pain: Secondary | ICD-10-CM | POA: Diagnosis not present

## 2021-09-24 DIAGNOSIS — Z0000 Encounter for general adult medical examination without abnormal findings: Secondary | ICD-10-CM

## 2021-09-24 DIAGNOSIS — F411 Generalized anxiety disorder: Secondary | ICD-10-CM

## 2021-09-24 DIAGNOSIS — Z23 Encounter for immunization: Secondary | ICD-10-CM | POA: Diagnosis not present

## 2021-09-24 DIAGNOSIS — I471 Supraventricular tachycardia: Secondary | ICD-10-CM | POA: Diagnosis not present

## 2021-09-24 DIAGNOSIS — M545 Low back pain, unspecified: Secondary | ICD-10-CM | POA: Diagnosis not present

## 2021-09-24 LAB — COMPREHENSIVE METABOLIC PANEL
ALT: 16 U/L (ref 0–35)
AST: 15 U/L (ref 0–37)
Albumin: 3.9 g/dL (ref 3.5–5.2)
Alkaline Phosphatase: 121 U/L — ABNORMAL HIGH (ref 39–117)
BUN: 13 mg/dL (ref 6–23)
CO2: 29 mEq/L (ref 19–32)
Calcium: 9.4 mg/dL (ref 8.4–10.5)
Chloride: 103 mEq/L (ref 96–112)
Creatinine, Ser: 0.54 mg/dL (ref 0.40–1.20)
GFR: 84.99 mL/min (ref >60.00)
Glucose, Bld: 94 mg/dL (ref 70–99)
Potassium: 4.1 mEq/L (ref 3.5–5.1)
Sodium: 138 mEq/L (ref 135–145)
Total Bilirubin: 0.7 mg/dL (ref 0.2–1.2)
Total Protein: 6.7 g/dL (ref 6.0–8.3)

## 2021-09-24 LAB — CBC WITH DIFFERENTIAL/PLATELET
Basophils Absolute: 0 10*3/uL (ref 0.0–0.1)
Basophils Relative: 1 % (ref 0.0–3.0)
Eosinophils Absolute: 0.2 10*3/uL (ref 0.0–0.7)
Eosinophils Relative: 5.4 % — ABNORMAL HIGH (ref 0.0–5.0)
HCT: 36.9 % (ref 36.0–46.0)
Hemoglobin: 12.2 g/dL (ref 12.0–15.0)
Lymphocytes Relative: 33.2 % (ref 12.0–46.0)
Lymphs Abs: 1.4 10*3/uL (ref 0.7–4.0)
MCHC: 33.2 g/dL (ref 30.0–36.0)
MCV: 89 fl (ref 78.0–100.0)
Monocytes Absolute: 0.4 10*3/uL (ref 0.1–1.0)
Monocytes Relative: 10.2 % (ref 3.0–12.0)
Neutro Abs: 2.1 10*3/uL (ref 1.4–7.7)
Neutrophils Relative %: 50.2 % (ref 43.0–77.0)
Platelets: 242 10*3/uL (ref 150.0–400.0)
RBC: 4.14 Mil/uL (ref 3.87–5.11)
RDW: 14.1 % (ref 11.5–15.5)
WBC: 4.2 10*3/uL (ref 4.0–10.5)

## 2021-09-24 LAB — LIPID PANEL
Cholesterol: 153 mg/dL (ref 0–200)
HDL: 67 mg/dL (ref >39.00)
LDL Cholesterol: 76 mg/dL (ref 0–99)
NonHDL: 86.19
Total CHOL/HDL Ratio: 2
Triglycerides: 51 mg/dL (ref 0.0–149.0)
VLDL: 10.2 mg/dL (ref 0.0–40.0)

## 2021-09-24 LAB — TSH: TSH: 1.49 u[IU]/mL (ref 0.35–5.50)

## 2021-09-24 MED ORDER — ALPRAZOLAM 1 MG PO TABS: 1.0000 mg | ORAL_TABLET | Freq: Two times a day (BID) | ORAL | 2 refills | Status: DC | PRN

## 2021-09-24 MED ORDER — MELOXICAM 7.5 MG PO TABS: 7.5000 mg | ORAL_TABLET | Freq: Every day | ORAL | 1 refills | Status: DC

## 2021-09-24 NOTE — Addendum Note (Signed)
Addended by: Elmer Picker on: 09/24/2021 10:33 AM   Modules accepted: Orders

## 2021-09-24 NOTE — Patient Instructions (Signed)
It was great seeing you today   We will follow up with you regarding your blood work   I will see you back in one year or sooner if needed 

## 2021-09-24 NOTE — Progress Notes (Signed)
Subjective:    Patient ID: [name], female    DOB: Mar 27, 1936, 85 y.o.   MRN: [id number]  HPI Patient presents for yearly preventative medicine examination. She is a pleasant 85 year old female who  has a past medical history of Anxiety state, ARTHRITIS (09/03/2010), Asthma, CHOLELITHIASIS (09/03/2010), COLONIC POLYPS, HX OF (06/02/2008), DIVERTICULAR DISEASE (09/03/2010), DYSPHAGIA UNSPECIFIED (09/03/2010), ESSENTIAL HYPERTENSION (10/29/2010), Fibromyalgia, GALLSTONE PANCREATITIS (09/03/2010), GERD (09/02/2007), HEART MURMUR, HX OF (08/12/2010), Hematuria, Hiatal hernia, colonoscopy, HYPERLIPIDEMIA (53/29/9242), Insomnia, Lichen simplex chronicus, Low back pain, Osteopenia, OSTEOPOROSIS (05/17/2009), Palpitations (09/03/2010), Paresthesia, Polyuria, RBBB (09/05/2010), VENTRICULAR HYPERTROPHY, LEFT (08/12/2010), VITAMIN B12 DEFICIENCY (09/02/2007), and WEIGHT LOSS, ABNORMAL (06/02/2008).  Anxiety-currently prescribed Celexa 20 mg daily and Xanax 1 mg twice daily as needed.  She was switched roughly 1 year ago from Zoloft to Celexa as she was still having significant anxiety worrying about her children and grandchildren. She reports that she still has episodes of anxiety but feels better controlled.   GERD - Controlled with Nexium 40 mg  Osteoarthritis -only in her lower back and bilateral hands.  She takes Mobic as needed which seems to help resolve some of her pain and stiffness  History of SVT -followed by cardiology yearly.  Currently prescribed Lopressor 25 mg BID.  She denies palpitations, chest pain, shortness of breath  All immunizations and health maintenance protocols were reviewed with the patient and needed orders were placed.  Appropriate screening laboratory values were ordered for the patient including screening of hyperlipidemia, renal function and hepatic function. If indicated by BPH, a PSA was ordered.  Medication reconciliation,  past medical history, social history, problem list and  allergies were reviewed in detail with the patient  Goals were established with regard to weight loss, exercise, and  diet in compliance with medications    Review of Systems  Constitutional: Negative.   HENT: Negative.    Eyes: Negative.   Respiratory: Negative.    Cardiovascular: Negative.   Gastrointestinal: Negative.   Endocrine: Negative.   Genitourinary: Negative.   Musculoskeletal: Negative.   Skin: Negative.   Allergic/Immunologic: Negative.   Neurological: Negative.   Hematological: Negative.   Psychiatric/Behavioral:  The patient is nervous/anxious.    Past Medical History:  Diagnosis Date   Anxiety state    unspecified   ARTHRITIS 09/03/2010   Asthma    CHOLELITHIASIS 09/03/2010   COLONIC POLYPS, HX OF 06/02/2008   DIVERTICULAR DISEASE 09/03/2010   DYSPHAGIA UNSPECIFIED 09/03/2010   ESSENTIAL HYPERTENSION 10/29/2010   Fibromyalgia    GALLSTONE PANCREATITIS 09/03/2010   GERD 09/02/2007   HEART MURMUR, HX OF 08/12/2010   Hematuria    Hiatal hernia    Hx of colonoscopy    2014   HYPERLIPIDEMIA 68/34/1962   Insomnia    Lichen simplex chronicus    Low back pain    chronic   Osteopenia    OSTEOPOROSIS 05/17/2009   Palpitations 09/03/2010   event monitor reveals long RP tachycardia, likely sinus tachycardia   Paresthesia    Polyuria    RBBB 09/05/2010   VENTRICULAR HYPERTROPHY, LEFT 08/12/2010   VITAMIN B12 DEFICIENCY 09/02/2007   WEIGHT LOSS, ABNORMAL 06/02/2008    Social History   Socioeconomic History   Marital status: Widowed    Spouse name: Not on file   Number of children: 6   Years of education: Not on file   Highest education level: Not on file  Occupational History   Occupation: retired  Tobacco Use   Smoking status: Never  Smokeless tobacco: Never  Vaping Use   Vaping Use: Never used  Substance and Sexual Activity   Alcohol use: No    Alcohol/week: 0.0 standard drinks   Drug use: No   Sexual activity: Never  Other Topics Concern   Not on file   Social History Narrative   Widowed   2 children   Lives in [place]   Retired from [employer] Strain: Low Risk    Difficulty of Paying Living Expenses: Not hard at all  Food Insecurity: No Food Insecurity   Worried About Charity fundraiser in the Last Year: Never true   Arboriculturist in the Last Year: Never true  Transportation Needs: No Transportation Needs   Lack of Transportation (Medical): No   Lack of Transportation (Non-Medical): No  Physical Activity: Inactive   Days of Exercise per Week: 0 days   Minutes of Exercise per Session: 0 min  Stress: No Stress Concern Present   Feeling of Stress : Not at all  Social Connections: Moderately Integrated   Frequency of Communication with Friends and Family: More than three times a week   Frequency of Social Gatherings with Friends and Family: Once a week   Attends Religious Services: More than 4 times per year   Active Member of [employer] or Organizations: Yes   Attends Archivist Meetings: More than 4 times per year   Marital Status: Widowed  Human resources officer Violence: Not At Risk   Fear of Current or Ex-Partner: No   Emotionally Abused: No   Physically Abused: No   Sexually Abused: No    Past Surgical History:  Procedure Laterality Date   ABDOMINAL HYSTERECTOMY     THA and O   APPENDECTOMY     CHOLECYSTECTOMY     ESOPHAGOGASTRODUODENOSCOPY     with HH and esophagitis   OOPHORECTOMY     SPINE SURGERY     cervical fusion    Family History  Problem Relation Age of Onset   Heart disease Mother    Stroke Mother    Prostate cancer Father     Allergies  Allergen Reactions   Celebrex [Celecoxib] Rash    REACTION: rash    Current Outpatient Medications on File Prior to Visit  Medication Sig Dispense Refill   Ascorbic Acid (VITAMIN C) 100 MG tablet Take 100 mg by mouth daily.     Calcium Carbonate-Vitamin D 600-400 MG-UNIT chew tablet Chew 1 tablet  by mouth 2 (two) times daily.     cholecalciferol (VITAMIN D3) 25 MCG (1000 UNIT) tablet Take 1,000 Units by mouth daily.     citalopram (CELEXA) 20 MG tablet Take 1 tablet (20 mg total) by mouth daily. 90 tablet 0   Cyanocobalamin (VITAMIN B 12 PO) Take 100 mcg by mouth daily.     esomeprazole (NEXIUM) 40 MG capsule Take 1 capsule (40 mg total) by mouth daily at 12 noon. 30 capsule 3   magnesium hydroxide (MILK OF MAGNESIA) 400 MG/5ML suspension Take 5 mLs by mouth daily as needed for mild constipation.     metoprolol tartrate (LOPRESSOR) 25 MG tablet Take 1 tablet by mouth twice daily 180 tablet 2   Multiple Vitamin (MULTIVITAMIN) tablet Take 1 tablet by mouth daily.     POTASSIUM GLUCONATE PO Take 2 tablets by mouth.     vitamin E 1000 UNIT capsule Take 1,000 Units by mouth daily.  zinc gluconate 50 MG tablet Take 50 mg by mouth daily.     acetaminophen (TYLENOL) 650 MG CR tablet Take 650 mg by mouth every 8 (eight) hours as needed for pain (arthritis). (Patient not taking: Reported on 09/24/2021)     aspirin 81 MG tablet Take 81 mg by mouth daily.     No current facility-administered medications on file prior to visit.    BP 120/60   Pulse (!) 59   Temp 98.1 F (36.7 C) (Oral)   Ht 5\' 5"  (1.651 m)   Wt 145 lb (65.8 kg)   SpO2 99%   BMI 24.13 kg/m        Objective:   Physical Exam Vitals and nursing note reviewed.  Constitutional:      General: She is not in acute distress.    Appearance: Normal appearance. She is well-developed. She is not ill-appearing.  HENT:     Head: Normocephalic and atraumatic.     Right Ear: Tympanic membrane, ear canal and external ear normal. There is no impacted cerumen.     Left Ear: Tympanic membrane, ear canal and external ear normal. There is no impacted cerumen.     Nose: Nose normal. No congestion or rhinorrhea.     Mouth/Throat:     Mouth: Mucous membranes are moist.     Pharynx: Oropharynx is clear. No oropharyngeal exudate or  posterior oropharyngeal erythema.  Eyes:     General:        Right eye: No discharge.        Left eye: No discharge.     Extraocular Movements: Extraocular movements intact.     Conjunctiva/sclera: Conjunctivae normal.     Pupils: Pupils are equal, round, and reactive to light.  Neck:     Thyroid: No thyromegaly.     Vascular: No carotid bruit.     Trachea: No tracheal deviation.  Cardiovascular:     Rate and Rhythm: Normal rate and regular rhythm.     Pulses: Normal pulses.     Heart sounds: Normal heart sounds. No murmur heard.   No friction rub. No gallop.  Pulmonary:     Effort: Pulmonary effort is normal. No respiratory distress.     Breath sounds: Normal breath sounds. No stridor. No wheezing, rhonchi or rales.  Chest:     Chest wall: No tenderness.  Abdominal:     General: Abdomen is flat. Bowel sounds are normal. There is no distension.     Palpations: Abdomen is soft. There is no mass.     Tenderness: There is no abdominal tenderness. There is no right CVA tenderness, left CVA tenderness, guarding or rebound.     Hernia: No hernia is present.  Musculoskeletal:        General: No swelling, tenderness, deformity or signs of injury. Normal range of motion.     Cervical back: Normal range of motion and neck supple.     Right lower leg: No edema.     Left lower leg: No edema.  Lymphadenopathy:     Cervical: No cervical adenopathy.  Skin:    General: Skin is warm and dry.     Coloration: Skin is not jaundiced or pale.     Findings: No bruising, erythema, lesion or rash.  Neurological:     General: No focal deficit present.     Mental Status: She is alert and oriented to person, place, and time.     Cranial Nerves: No cranial nerve deficit.  Sensory: No sensory deficit.     Motor: No weakness.     Coordination: Coordination normal.     Gait: Gait normal.     Deep Tendon Reflexes: Reflexes normal.  Psychiatric:        Mood and Affect: Mood normal.        Behavior:  Behavior normal.        Thought Content: Thought content normal.        Judgment: Judgment normal.          Assessment & Plan:  1. Routine general medical examination at a health care facility - Well appearing 85 year old female  - Advised follow up in one year or sooner if needed - CBC with Differential/Platelet; Future - Comprehensive metabolic panel; Future - Lipid panel; Future - TSH; Future  2. Generalized anxiety disorder - Continue with Celexa 20 mg. Will keep on 20 mg dose due to age.  - ALPRAZolam (XANAX) 1 MG tablet; Take 1 tablet (1 mg total) by mouth 2 (two) times daily as needed. for anxiety  Dispense: 60 tablet; Refill: 2  3. Chronic low back pain without sciatica, unspecified back pain laterality  - meloxicam (MOBIC) 7.5 MG tablet; Take 1 tablet (7.5 mg total) by mouth daily.  Dispense: 90 tablet; Refill: 1  4. Need for immunization against influenza  - Flu Vaccine QUAD 69mo+IM (Fluarix, Fluzone & Alfiuria Quad PF)  5. SVT (supraventricular tachycardia) ([place]) - Follow up with Cardiology as directed  Dorothyann Peng, NP

## 2021-09-27 ENCOUNTER — Telehealth: Payer: Self-pay | Admitting: Adult Health

## 2021-09-27 NOTE — Telephone Encounter (Signed)
Patient is returning a call to the office.

## 2021-09-27 NOTE — Telephone Encounter (Signed)
Patient is returning someone call.  Patient could be contacted at [phone].  Please advise.

## 2021-09-27 NOTE — Telephone Encounter (Signed)
This has been taking care of.

## 2021-11-14 DIAGNOSIS — Z23 Encounter for immunization: Secondary | ICD-10-CM | POA: Diagnosis not present

## 2021-12-05 ENCOUNTER — Telehealth: Payer: Self-pay | Admitting: Pharmacist

## 2021-12-05 NOTE — Chronic Care Management (AMB) (Signed)
Chronic Care Management Pharmacy Assistant   Name: [name]  MRN: [id number] DOB: 06-16-1936  Reason for Encounter: Disease State / Hypertension Assessment Call   Conditions to be addressed/monitored: HTN  Recent office visits:  09/24/2021 [name] - Patient was seen for routine general medical exam and additional issues. Changed Meloxicam 7.5 mg from qd prn to daily. Discontinued Aspirin. Follow up in 1 year  Recent consult visits:  None  Hospital visits:  None  Medications: Outpatient Encounter Medications as of 12/05/2021  Medication Sig   acetaminophen (TYLENOL) 650 MG CR tablet Take 650 mg by mouth every 8 (eight) hours as needed for pain (arthritis). (Patient not taking: Reported on 09/24/2021)   ALPRAZolam (XANAX) 1 MG tablet Take 1 tablet (1 mg total) by mouth 2 (two) times daily as needed. for anxiety   Ascorbic Acid (VITAMIN C) 100 MG tablet Take 100 mg by mouth daily.   Calcium Carbonate-Vitamin D 600-400 MG-UNIT chew tablet Chew 1 tablet by mouth 2 (two) times daily.   cholecalciferol (VITAMIN D3) 25 MCG (1000 UNIT) tablet Take 1,000 Units by mouth daily.   citalopram (CELEXA) 20 MG tablet Take 1 tablet (20 mg total) by mouth daily.   Cyanocobalamin (VITAMIN B 12 PO) Take 100 mcg by mouth daily.   esomeprazole (NEXIUM) 40 MG capsule Take 1 capsule (40 mg total) by mouth daily at 12 noon.   magnesium hydroxide (MILK OF MAGNESIA) 400 MG/5ML suspension Take 5 mLs by mouth daily as needed for mild constipation.   meloxicam (MOBIC) 7.5 MG tablet Take 1 tablet (7.5 mg total) by mouth daily.   metoprolol tartrate (LOPRESSOR) 25 MG tablet Take 1 tablet by mouth twice daily   Multiple Vitamin (MULTIVITAMIN) tablet Take 1 tablet by mouth daily.   POTASSIUM GLUCONATE PO Take 2 tablets by mouth.   vitamin E 1000 UNIT capsule Take 1,000 Units by mouth daily.   zinc gluconate 50 MG tablet Take 50 mg by mouth daily.   No facility-administered encounter  medications on file as of 12/05/2021.  Fill History: ALPRAZolam 1MG       TAB 09/10/2021 30   CITALOPRAM 20MG      TAB 07/02/2021 90   MELOXICAM 7.5MG      TAB 09/24/2021 90   METOPROLOL TARTRATE 25MG  TAB 07/04/2021 90   Reviewed chart prior to disease state call. Spoke with patient regarding BP  Recent Office Vitals: BP Readings from Last 3 Encounters:  09/24/21 120/60  06/01/21 126/62  02/25/21 112/62   Pulse Readings from Last 3 Encounters:  09/24/21 (!) 59  06/01/21 (!) 58  02/25/21 62    Wt Readings from Last 3 Encounters:  09/24/21 145 lb (65.8 kg)  06/01/21 143 lb (64.9 kg)  02/25/21 146 lb 3.2 oz (66.3 kg)     Kidney Function Lab Results  Component Value Date/Time   CREATININE 0.54 09/24/2021 10:33 AM   CREATININE 0.50 (L) 09/20/2020 11:16 AM   CREATININE 0.48 09/20/2019 11:19 AM   GFR 84.99 09/24/2021 10:33 AM   GFRNONAA 90 09/20/2020 11:16 AM   GFRAA 104 09/20/2020 11:16 AM    BMP Latest Ref Rng & Units 09/24/2021 09/20/2020 09/20/2019  Glucose 70 - 99 mg/dL 94 91 93  BUN 6 - 23 mg/dL 13 10 11   Creatinine 0.40 - 1.20 mg/dL 0.54 0.50(L) 0.48  BUN/Creat Ratio 6 - 22 (calc) - 20 -  Sodium 135 - 145 mEq/L 138 139 139  Potassium 3.5 - 5.1 mEq/L 4.1 4.8 3.6  Chloride 96 - 112 mEq/L 103 105 102  CO2 19 - 32 mEq/L 29 28 31   Calcium 8.4 - 10.5 mg/dL 9.4 9.5 9.6    Current antihypertensive regimen:  Metoprolol 25 mg twice daily  How often are you checking your Blood Pressure? 1 X per week  Current home BP readings: Last reading was 125/80  What recent interventions/DTPs have been made by any provider to improve Blood Pressure control since last CPP Visit: None  Any recent hospitalizations or ED visits since last visit with CPP? No  What diet changes have been made to improve Blood Pressure Control?  Patient will have an egg with cheese for breakfast, she doesn't eat lunch and will have a meat, vegetable and starch for dinner.  What exercise is being done to  improve your Blood Pressure Control?  Patient goes to the [facility] twice weekly, walks and does all her house work.  Adherence Review: Is the patient currently on ACE/ARB medication? No Does the patient have >5 day gap between last estimated fill dates? No   Care Gaps: AWV - scheduled  01/10/22 Last BP - 120/60 on 09/24/2021 Zoster vaccines - never done Covid vaccine - overdue Hemoglobin A1C - overdue - last 5.6 on 09/09/2018  Star Rating Drugs: None  [place] Pharmacist Assistant [phone]

## 2021-12-28 ENCOUNTER — Ambulatory Visit
Admission: EM | Admit: 2021-12-28 | Discharge: 2021-12-28 | Disposition: A | Discharge status: Home / Self Care | Payer: Medicare Other | Attending: Family Medicine | Admitting: Family Medicine

## 2021-12-28 ENCOUNTER — Other Ambulatory Visit: Payer: Self-pay

## 2021-12-28 ENCOUNTER — Ambulatory Visit: Payer: Self-pay

## 2021-12-28 DIAGNOSIS — J0100 Acute maxillary sinusitis, unspecified: Secondary | ICD-10-CM | POA: Diagnosis not present

## 2021-12-28 DIAGNOSIS — Z20828 Contact with and (suspected) exposure to other viral communicable diseases: Secondary | ICD-10-CM | POA: Diagnosis not present

## 2021-12-28 MED ORDER — AMOXICILLIN-POT CLAVULANATE 875-125 MG PO TABS: 1.0000 | ORAL_TABLET | Freq: Two times a day (BID) | ORAL | 0 refills | Status: DC

## 2021-12-28 MED ORDER — FLUTICASONE PROPIONATE 50 MCG/ACT NA SUSP: 1.0000 | Freq: Two times a day (BID) | NASAL | 0 refills | Status: AC

## 2021-12-28 NOTE — ED Triage Notes (Signed)
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   She states she has only took Tylenol for her symptoms.   She states she has a headache sometimes  Denies Fever

## 2021-12-28 NOTE — ED Provider Notes (Signed)
[facility]    CSN: [id number] Arrival date & time: 12/28/21  1249      History   Chief Complaint Chief Complaint  Patient presents with   Cough    Sinus issues and cough    HPI [name] is a 85 y.o. female.   Patient presenting today with 9 to 10 days of thick nasal congestion with blood streaks, sinus pain and pressure, scratchy throat, cough.  She denies fever, chills, body aches, chest pain, shortness of breath, abdominal pain, nausea vomiting or diarrhea.  Taking Tylenol with no relief of symptoms.  No known pertinent chronic medical problems.  No known sick contacts recently.   Past Medical History:  Diagnosis Date   Anxiety state    unspecified   ARTHRITIS 09/03/2010   Asthma    CHOLELITHIASIS 09/03/2010   COLONIC POLYPS, HX OF 06/02/2008   DIVERTICULAR DISEASE 09/03/2010   DYSPHAGIA UNSPECIFIED 09/03/2010   ESSENTIAL HYPERTENSION 10/29/2010   Fibromyalgia    GALLSTONE PANCREATITIS 09/03/2010   GERD 09/02/2007   HEART MURMUR, HX OF 08/12/2010   Hematuria    Hiatal hernia    Hx of colonoscopy    2014   HYPERLIPIDEMIA 40/98/1191   Insomnia    Lichen simplex chronicus    Low back pain    chronic   Osteopenia    OSTEOPOROSIS 05/17/2009   Palpitations 09/03/2010   event monitor reveals long RP tachycardia, likely sinus tachycardia   Paresthesia    Polyuria    RBBB 09/05/2010   VENTRICULAR HYPERTROPHY, LEFT 08/12/2010   VITAMIN B12 DEFICIENCY 09/02/2007   WEIGHT LOSS, ABNORMAL 06/02/2008    Patient Active Problem List   Diagnosis Date Noted   SVT (supraventricular tachycardia) ([place]) 03/13/2019   Impaired glucose tolerance 11/26/2015   Lung nodule 06/22/2015   Thyroid nodule, uninodular, isthmus 08/01/2014   Bifascicular block 05/12/2011   Hiatal hernia    Essential hypertension 10/29/2010   RBBB 09/05/2010   DIVERTICULAR DISEASE 09/03/2010   VENTRICULAR HYPERTROPHY, LEFT 08/12/2010   Insomnia 08/12/2010   HEART MURMUR, HX OF 47/82/9562   LICHEN  SIMPLEX CHRONICUS 01/17/2010   Osteoporosis 05/17/2009   Generalized anxiety disorder 08/30/2008   Personal hx of colon adenomas 06/02/2008   Hyperlipidemia 12/09/2007   LOW BACK PAIN 12/09/2007   B12 deficiency 09/02/2007   GERD 09/02/2007   Asthma 08/10/2007    Past Surgical History:  Procedure Laterality Date   ABDOMINAL HYSTERECTOMY     THA and O   APPENDECTOMY     CHOLECYSTECTOMY     ESOPHAGOGASTRODUODENOSCOPY     with HH and esophagitis   OOPHORECTOMY     SPINE SURGERY     cervical fusion    OB History   No obstetric history on file.      Home Medications    Prior to Admission medications   Medication Sig Start Date End Date Taking? Authorizing Provider  amoxicillin-clavulanate (AUGMENTIN) 875-125 MG tablet Take 1 tablet by mouth every 12 (twelve) hours. 12/28/21  Yes Volney American, PA-C  fluticasone [facility]) 50 MCG/ACT nasal spray Place 1 spray into both nostrils 2 (two) times daily. 12/28/21  Yes Volney American, PA-C  acetaminophen (TYLENOL) 650 MG CR tablet Take 650 mg by mouth every 8 (eight) hours as needed for pain (arthritis). Patient not taking: Reported on 09/24/2021    [provider]  ALPRAZolam Duanne Moron) 1 MG tablet Take 1 tablet (1 mg total) by mouth 2 (two) times daily as needed. for  anxiety 09/24/21   Nafziger, Tommi Rumps, NP  Ascorbic Acid (VITAMIN C) 100 MG tablet Take 100 mg by mouth daily.    [provider]  Calcium Carbonate-Vitamin D 600-400 MG-UNIT chew tablet Chew 1 tablet by mouth 2 (two) times daily.    [provider]  cholecalciferol (VITAMIN D3) 25 MCG (1000 UNIT) tablet Take 1,000 Units by mouth daily.    [provider]  citalopram (CELEXA) 20 MG tablet Take 1 tablet (20 mg total) by mouth daily. 07/10/21 10/08/21  Nafziger, Tommi Rumps, NP  Cyanocobalamin (VITAMIN B 12 PO) Take 100 mcg by mouth daily.    [provider]  esomeprazole (NEXIUM) 40 MG capsule Take 1 capsule (40 mg total) by  mouth daily at 12 noon. 09/09/18   Marletta Lor, MD  magnesium hydroxide (MILK OF MAGNESIA) 400 MG/5ML suspension Take 5 mLs by mouth daily as needed for mild constipation.    [provider]  meloxicam (MOBIC) 7.5 MG tablet Take 1 tablet (7.5 mg total) by mouth daily. 09/24/21   Nafziger, Tommi Rumps, NP  metoprolol tartrate (LOPRESSOR) 25 MG tablet Take 1 tablet by mouth twice daily 03/28/21   Josue Hector, MD  Multiple Vitamin (MULTIVITAMIN) tablet Take 1 tablet by mouth daily.    [provider]  POTASSIUM GLUCONATE PO Take 2 tablets by mouth.    [provider]  vitamin E 1000 UNIT capsule Take 1,000 Units by mouth daily.    [provider]  zinc gluconate 50 MG tablet Take 50 mg by mouth daily.    [provider]    Family History Family History  Problem Relation Age of Onset   Heart disease Mother    Stroke Mother    Prostate cancer Father     Social History Social History   Tobacco Use   Smoking status: Never   Smokeless tobacco: Never  Vaping Use   Vaping Use: Never used  Substance Use Topics   Alcohol use: No    Alcohol/week: 0.0 standard drinks   Drug use: No     Allergies   Celebrex [celecoxib]   Review of Systems Review of Systems Per HPI  Physical Exam Triage Vital Signs ED Triage Vitals  Enc Vitals Group     BP 12/28/21 1349 117/71     Pulse Rate 12/28/21 1349 74     Resp 12/28/21 1349 16     Temp 12/28/21 1349 98 F (36.7 C)     Temp Source 12/28/21 1349 Oral     SpO2 12/28/21 1349 93 %     Weight --      Height --      Head Circumference --      Peak Flow --      Pain Score 12/28/21 1347 0     Pain Loc --      Pain Edu? --      Excl. in [place]? --    No data found.  Updated Vital Signs BP 117/71 (BP Location: Right Arm)    Pulse 74    Temp 98 F (36.7 C) (Oral)    Resp 16    SpO2 93%   Visual Acuity Right Eye Distance:   Left Eye Distance:   Bilateral Distance:    Right Eye Near:   Left  Eye Near:    Bilateral Near:     Physical Exam Vitals and nursing note reviewed.  Constitutional:      Appearance: Normal appearance.  HENT:  Head: Atraumatic.     Right Ear: Tympanic membrane and external ear normal.     Left Ear: Tympanic membrane and external ear normal.     Nose: Congestion present.     Comments: Bilateral maxillary sinuses tender to palpation    Mouth/Throat:     Mouth: Mucous membranes are moist.     Pharynx: Posterior oropharyngeal erythema present.  Eyes:     Extraocular Movements: Extraocular movements intact.     Conjunctiva/sclera: Conjunctivae normal.  Cardiovascular:     Rate and Rhythm: Normal rate and regular rhythm.     Heart sounds: Normal heart sounds.  Pulmonary:     Effort: Pulmonary effort is normal.     Breath sounds: Normal breath sounds. No wheezing.  Musculoskeletal:        General: Normal range of motion.     Cervical back: Normal range of motion and neck supple.  Skin:    General: Skin is warm and dry.  Neurological:     Mental Status: She is alert and oriented to person, place, and time.  Psychiatric:        Mood and Affect: Mood normal.        Thought Content: Thought content normal.   UC Treatments / Results  Labs (all labs ordered are listed, but only abnormal results are displayed) Labs Reviewed  COVID-19, FLU A+B NAA   EKG   Radiology No results found.  Procedures Procedures (including critical care time)  Medications Ordered in UC Medications - No data to display  Initial Impression / Assessment and Plan / UC Course  I have reviewed the triage vital signs and the nursing notes.  Pertinent labs & imaging results that were available during my care of the patient were reviewed by me and considered in my medical decision making (see chart for details).     Vital signs reassuring today, exam indicative of a secondary bacterial infection likely initially a viral cause.  COVID and flu testing pending, treat  with Augmentin, Flonase nasal spray, supportive care and return precautions reviewed.  Final Clinical Impressions(s) / UC Diagnoses   Final diagnoses:  Exposure to the flu  Acute non-recurrent maxillary sinusitis   Discharge Instructions   None    ED Prescriptions     Medication Sig Dispense Auth. Provider   amoxicillin-clavulanate (AUGMENTIN) 875-125 MG tablet Take 1 tablet by mouth every 12 (twelve) hours. 14 tablet Volney American, PA-C   fluticasone [facility]) 50 MCG/ACT nasal spray Place 1 spray into both nostrils 2 (two) times daily. 16 g Volney American, [place]      PDMP not reviewed this encounter.   Volney American, [place] 12/28/21 1450

## 2021-12-29 LAB — COVID-19, FLU A+B NAA
Influenza A, NAA: NOT DETECTED
Influenza B, NAA: NOT DETECTED
SARS-CoV-2, NAA: NOT DETECTED

## 2022-01-02 ENCOUNTER — Other Ambulatory Visit: Payer: Self-pay | Admitting: Cardiovascular Disease

## 2022-01-02 ENCOUNTER — Other Ambulatory Visit: Payer: Self-pay | Admitting: Adult Health

## 2022-01-02 DIAGNOSIS — I471 Supraventricular tachycardia: Secondary | ICD-10-CM

## 2022-01-10 ENCOUNTER — Ambulatory Visit: Payer: Medicare Other

## 2022-01-14 ENCOUNTER — Telehealth: Payer: Self-pay | Admitting: Adult Health

## 2022-01-14 NOTE — Telephone Encounter (Signed)
Patient returned my call.  She wanted to see [facility].  Transferred to office to get appt scheduled

## 2022-01-14 NOTE — Telephone Encounter (Signed)
Left message for patient to call back and schedule Medicare Annual Wellness Visit (AWV) either virtually or in office. Left  my Herbie Drape number [phone]   Last AWV 01/09/21  please schedule at anytime with [facility] Nurse Health Advisor 1 or 2   This should be a 45 minute visit.

## 2022-01-14 NOTE — Telephone Encounter (Signed)
Patient wants Tommi Rumps to do her AWV.  Does Tommi Rumps do AWV?  Shirlean Mylar called her so she needs to be scheduled--pt wants a call back to let her know if Tommi Rumps will do this.

## 2022-01-15 NOTE — Telephone Encounter (Signed)
Please advise 

## 2022-01-15 NOTE — Telephone Encounter (Signed)
Patient notified of update  and verbalized understanding. 

## 2022-01-23 ENCOUNTER — Ambulatory Visit (INDEPENDENT_AMBULATORY_CARE_PROVIDER_SITE_OTHER): Payer: Medicare Other

## 2022-01-23 VITALS — BP 118/62 | HR 67 | Temp 98.1°F | Ht 65.0 in | Wt 142.0 lb

## 2022-01-23 DIAGNOSIS — Z0000 Encounter for general adult medical examination without abnormal findings: Secondary | ICD-10-CM | POA: Diagnosis not present

## 2022-01-23 NOTE — Patient Instructions (Addendum)
[name] , Thank you for taking time to come for your Medicare Wellness Visit. I appreciate your ongoing commitment to your health goals. Please review the following plan we discussed and let me know if I can assist you in the future.   These are the goals we discussed:  Goals      Chronic Care Management     CARE PLAN ENTRY (see longitudinal plan of care for additional care plan information)  Current Barriers:  Chronic Disease Management support, education, and care coordination needs related to Hypertension, Hyperlipidemia, GERD, Anxiety and Osteoporosis    Hypertension BP Readings from Last 3 Encounters:  09/20/20 110/70  06/07/20 110/60  02/28/20 110/68  Pharmacist Clinical Goal(s): Over the next 90 days, patient will work with PharmD and providers to maintain BP goal <140/90 Current regimen:  None Interventions: Discussed low salt diet and exercising as tolerated extensively Will initiate blood pressure monitoring plan  Patient self care activities - Over the next 90 days, patient will: Ensure daily salt intake < 2300 mg/day  Hyperlipidemia Lab Results  Component Value Date/Time   LDLCALC 93 09/20/2020 11:16 AM   LDLDIRECT 78.4 04/18/2010 10:56 AM  Pharmacist Clinical Goal(s): Over the next 90 days, patient will work with PharmD and providers to maintain LDL goal < 100 Current regimen:  None Interventions: Discussed low cholesterol diet and exercising as tolerated extensively Will initiate cholesterol monitoring plan   Medication management Pharmacist Clinical Goal(s): Over the next 90 days, patient will work with PharmD and providers to maintain optimal medication adherence Current pharmacy: [facility] Interventions Comprehensive medication review performed. Continue current medication management strategy Patient self care activities - Over the next 90 days, patient will: Take medications as prescribed Report any questions or concerns to PharmD and/or  provider(s)     Exercise 3x per week (30 min per time)     Continue to walk 2x weekly for 30 min.        This is a list of the screening recommended for you and due dates:  Health Maintenance  Topic Date Due   Hemoglobin A1C  02/23/2022*   Zoster (Shingles) Vaccine (1 of 2) 04/23/2022*   Tetanus Vaccine  08/13/2024   Pneumonia Vaccine  Completed   Flu Shot  Completed   DEXA scan (bone density measurement)  Completed   COVID-19 Vaccine  Completed   HPV Vaccine  Aged Out   Complete foot exam   Discontinued   Eye exam for diabetics  Discontinued   Urine Protein Check  Discontinued  *Topic was postponed. The date shown is not the original due date.   Advanced directives: Yes  Conditions/risks identified: None  Next appointment: Follow up in one year for your annual wellness visit    Preventive Care 65 Years and Older, Female Preventive care refers to lifestyle choices and visits with your health care provider that can promote health and wellness. What does preventive care include? A yearly physical exam. This is also called an annual well check. Dental exams once or twice a year. Routine eye exams. Ask your health care provider how often you should have your eyes checked. Personal lifestyle choices, including: Daily care of your teeth and gums. Regular physical activity. Eating a healthy diet. Avoiding tobacco and drug use. Limiting alcohol use. Practicing safe sex. Taking low-dose aspirin every day. Taking vitamin and mineral supplements as recommended by your health care provider. What happens during an annual well check? The services and screenings done by your health care  provider during your annual well check will depend on your age, overall health, lifestyle risk factors, and family history of disease. Counseling  Your health care provider may ask you questions about your: Alcohol use. Tobacco use. Drug use. Emotional well-being. Home and relationship  well-being. Sexual activity. Eating habits. History of falls. Memory and ability to understand (cognition). Work and work Statistician. Reproductive health. Screening  You may have the following tests or measurements: Height, weight, and BMI. Blood pressure. Lipid and cholesterol levels. These may be checked every 5 years, or more frequently if you are over 100 years old. Skin check. Lung cancer screening. You may have this screening every year starting at age 104 if you have a 30-pack-year history of smoking and currently smoke or have quit within the past 15 years. Fecal occult blood test (FOBT) of the stool. You may have this test every year starting at age 70. Flexible sigmoidoscopy or colonoscopy. You may have a sigmoidoscopy every 5 years or a colonoscopy every 10 years starting at age 16. Hepatitis C blood test. Hepatitis B blood test. Sexually transmitted disease (STD) testing. Diabetes screening. This is done by checking your blood sugar (glucose) after you have not eaten for a while (fasting). You may have this done every 1-3 years. Bone density scan. This is done to screen for osteoporosis. You may have this done starting at age 12. Mammogram. This may be done every 1-2 years. Talk to your health care provider about how often you should have regular mammograms. Talk with your health care provider about your test results, treatment options, and if necessary, the need for more tests. Vaccines  Your health care provider may recommend certain vaccines, such as: Influenza vaccine. This is recommended every year. Tetanus, diphtheria, and acellular pertussis (Tdap, Td) vaccine. You may need a Td booster every 10 years. Zoster vaccine. You may need this after age 2. Pneumococcal 13-valent conjugate (PCV13) vaccine. One dose is recommended after age 18. Pneumococcal polysaccharide (PPSV23) vaccine. One dose is recommended after age 70. Talk to your health care provider about which  screenings and vaccines you need and how often you need them. This information is not intended to replace advice given to you by your health care provider. Make sure you discuss any questions you have with your health care provider. Document Released: 01/11/2016 Document Revised: 09/03/2016 Document Reviewed: 10/16/2015 Elsevier Interactive Patient Education  2017 [place] Prevention in the Home Falls can cause injuries. They can happen to people of all ages. There are many things you can do to make your home safe and to help prevent falls. What can I do on the outside of my home? Regularly fix the edges of walkways and driveways and fix any cracks. Remove anything that might make you trip as you walk through a door, such as a raised step or threshold. Trim any bushes or trees on the path to your home. Use bright outdoor lighting. Clear any walking paths of anything that might make someone trip, such as rocks or tools. Regularly check to see if handrails are loose or broken. Make sure that both sides of any steps have handrails. Any raised decks and porches should have guardrails on the edges. Have any leaves, snow, or ice cleared regularly. Use sand or salt on walking paths during winter. Clean up any spills in your garage right away. This includes oil or grease spills. What can I do in the bathroom? Use night lights. Install grab bars by the  toilet and in the tub and shower. Do not use towel bars as grab bars. Use non-skid mats or decals in the tub or shower. If you need to sit down in the shower, use a plastic, non-slip stool. Keep the floor dry. Clean up any water that spills on the floor as soon as it happens. Remove soap buildup in the tub or shower regularly. Attach bath mats securely with double-sided non-slip rug tape. Do not have throw rugs and other things on the floor that can make you trip. What can I do in the bedroom? Use night lights. Make sure that you have a  light by your bed that is easy to reach. Do not use any sheets or blankets that are too big for your bed. They should not hang down onto the floor. Have a firm chair that has side arms. You can use this for support while you get dressed. Do not have throw rugs and other things on the floor that can make you trip. What can I do in the kitchen? Clean up any spills right away. Avoid walking on wet floors. Keep items that you use a lot in easy-to-reach places. If you need to reach something above you, use a strong step stool that has a grab bar. Keep electrical cords out of the way. Do not use floor polish or wax that makes floors slippery. If you must use wax, use non-skid floor wax. Do not have throw rugs and other things on the floor that can make you trip. What can I do with my stairs? Do not leave any items on the stairs. Make sure that there are handrails on both sides of the stairs and use them. Fix handrails that are broken or loose. Make sure that handrails are as long as the stairways. Check any carpeting to make sure that it is firmly attached to the stairs. Fix any carpet that is loose or worn. Avoid having throw rugs at the top or bottom of the stairs. If you do have throw rugs, attach them to the floor with carpet tape. Make sure that you have a light switch at the top of the stairs and the bottom of the stairs. If you do not have them, ask someone to add them for you. What else can I do to help prevent falls? Wear shoes that: Do not have high heels. Have rubber bottoms. Are comfortable and fit you well. Are closed at the toe. Do not wear sandals. If you use a stepladder: Make sure that it is fully opened. Do not climb a closed stepladder. Make sure that both sides of the stepladder are locked into place. Ask someone to hold it for you, if possible. Clearly mark and make sure that you can see: Any grab bars or handrails. First and last steps. Where the edge of each step  is. Use tools that help you move around (mobility aids) if they are needed. These include: Canes. Walkers. Scooters. Crutches. Turn on the lights when you go into a dark area. Replace any light bulbs as soon as they burn out. Set up your furniture so you have a clear path. Avoid moving your furniture around. If any of your floors are uneven, fix them. If there are any pets around you, be aware of where they are. Review your medicines with your doctor. Some medicines can make you feel dizzy. This can increase your chance of falling. Ask your doctor what other things that you can do to help prevent  falls. This information is not intended to replace advice given to you by your health care provider. Make sure you discuss any questions you have with your health care provider. Document Released: 10/11/2009 Document Revised: 05/22/2016 Document Reviewed: 01/19/2015 Elsevier Interactive Patient Education  2017 [employer].

## 2022-01-23 NOTE — Progress Notes (Signed)
Subjective:   [name] is a 86 y.o. female who presents for Medicare Annual (Subsequent) preventive examination.  Review of Systems    No ROS Cardiac Risk Factors include: advanced age (>12men, >37 women);hypertension     Objective:    Today's Vitals   01/23/22 1301  BP: 118/62  Pulse: 67  Temp: 98.1 F (36.7 C)  TempSrc: Oral  SpO2: 98%  Weight: 142 lb (64.4 kg)  Height: 5\' 5"  (1.651 m)   Body mass index is 23.63 kg/m.  Advanced Directives 01/23/2022 06/01/2021 01/09/2021 08/06/2015 05/26/2015  Does Patient Have a Medical Advance Directive? Yes No Yes Yes No  Type of Paramedic of [place];Living will - [place];Living will [place];Living will -  Does patient want to make changes to medical advance directive? No - Patient declined - No - Patient declined - -  Copy of [place] in Chart? No - copy requested - No - copy requested - -  Would patient like information on creating a medical advance directive? - No - Patient declined - - No - patient declined information    Current Medications (verified) Outpatient Encounter Medications as of 01/23/2022  Medication Sig   acetaminophen (TYLENOL) 650 MG CR tablet Take 650 mg by mouth every 8 (eight) hours as needed for pain (arthritis). (Patient not taking: Reported on 09/24/2021)   ALPRAZolam (XANAX) 1 MG tablet Take 1 tablet (1 mg total) by mouth 2 (two) times daily as needed. for anxiety   amoxicillin-clavulanate (AUGMENTIN) 875-125 MG tablet Take 1 tablet by mouth every 12 (twelve) hours. (Patient not taking: Reported on 01/23/2022)   Ascorbic Acid (VITAMIN C) 100 MG tablet Take 100 mg by mouth daily.   Calcium Carbonate-Vitamin D 600-400 MG-UNIT chew tablet Chew 1 tablet by mouth 2 (two) times daily.   cholecalciferol (VITAMIN D3) 25 MCG (1000 UNIT) tablet Take 1,000 Units by mouth daily.   citalopram (CELEXA) 20 MG tablet Take 1 tablet by  mouth once daily   Cyanocobalamin (VITAMIN B 12 PO) Take 100 mcg by mouth daily.   esomeprazole (NEXIUM) 40 MG capsule Take 1 capsule (40 mg total) by mouth daily at 12 noon.   fluticasone (FLONASE) 50 MCG/ACT nasal spray Place 1 spray into both nostrils 2 (two) times daily.   magnesium hydroxide (MILK OF MAGNESIA) 400 MG/5ML suspension Take 5 mLs by mouth daily as needed for mild constipation.   meloxicam (MOBIC) 7.5 MG tablet Take 1 tablet (7.5 mg total) by mouth daily.   metoprolol tartrate (LOPRESSOR) 25 MG tablet Take 1 tablet by mouth twice daily   Multiple Vitamin (MULTIVITAMIN) tablet Take 1 tablet by mouth daily.   POTASSIUM GLUCONATE PO Take 2 tablets by mouth.   vitamin E 1000 UNIT capsule Take 1,000 Units by mouth daily.   zinc gluconate 50 MG tablet Take 50 mg by mouth daily.   No facility-administered encounter medications on file as of 01/23/2022.    Allergies (verified) Celebrex [celecoxib]   History: Past Medical History:  Diagnosis Date   Anxiety state    unspecified   ARTHRITIS 09/03/2010   Asthma    CHOLELITHIASIS 09/03/2010   COLONIC POLYPS, HX OF 06/02/2008   DIVERTICULAR DISEASE 09/03/2010   DYSPHAGIA UNSPECIFIED 09/03/2010   ESSENTIAL HYPERTENSION 10/29/2010   Fibromyalgia    GALLSTONE PANCREATITIS 09/03/2010   GERD 09/02/2007   HEART MURMUR, HX OF 08/12/2010   Hematuria    Hiatal hernia  Hx of colonoscopy    2014   HYPERLIPIDEMIA 06/27/1600   Insomnia    Lichen simplex chronicus    Low back pain    chronic   Osteopenia    OSTEOPOROSIS 05/17/2009   Palpitations 09/03/2010   event monitor reveals long RP tachycardia, likely sinus tachycardia   Paresthesia    Polyuria    RBBB 09/05/2010   VENTRICULAR HYPERTROPHY, LEFT 08/12/2010   VITAMIN B12 DEFICIENCY 09/02/2007   WEIGHT LOSS, ABNORMAL 06/02/2008   Past Surgical History:  Procedure Laterality Date   ABDOMINAL HYSTERECTOMY     THA and O   APPENDECTOMY     CHOLECYSTECTOMY     ESOPHAGOGASTRODUODENOSCOPY      with HH and esophagitis   OOPHORECTOMY     SPINE SURGERY     cervical fusion   Family History  Problem Relation Age of Onset   Heart disease Mother    Stroke Mother    Prostate cancer Father    Social History   Socioeconomic History   Marital status: Widowed    Spouse name: Not on file   Number of children: 6   Years of education: Not on file   Highest education level: Not on file  Occupational History   Occupation: retired  Tobacco Use   Smoking status: Never   Smokeless tobacco: Never  Vaping Use   Vaping Use: Never used  Substance and Sexual Activity   Alcohol use: No    Alcohol/week: 0.0 standard drinks   Drug use: No   Sexual activity: Never  Other Topics Concern   Not on file  Social History Narrative   Widowed   2 children   Lives in [place]   Retired from [employer] Strain: Low Risk    Difficulty of Paying Living Expenses: Not hard at all  Food Insecurity: No Food Insecurity   Worried About Charity fundraiser in the Last Year: Never true   Arboriculturist in the Last Year: Never true  Transportation Needs: No Transportation Needs   Lack of Transportation (Medical): No   Lack of Transportation (Non-Medical): No  Physical Activity: Insufficiently Active   Days of Exercise per Week: 2 days   Minutes of Exercise per Session: 60 min  Stress: No Stress Concern Present   Feeling of Stress : Only a little  Social Connections: Moderately Integrated   Frequency of Communication with Friends and Family: More than three times a week   Frequency of Social Gatherings with Friends and Family: More than three times a week   Attends Religious Services: More than 4 times per year   Active Member of [employer] or Organizations: Yes   Attends Archivist Meetings: More than 4 times per year   Marital Status: Widowed    Clinical Intake: How often do you need to have someone help you when you read  instructions, pamphlets, or other written materials from your doctor or pharmacy?: 1 - Never  Diabetic? No  Interpreter Needed?: No Activities of Daily Living In your present state of health, do you have any difficulty performing the following activities: 01/23/2022  Hearing? N  Vision? N  Difficulty concentrating or making decisions? N  Walking or climbing stairs? N  Dressing or bathing? N  Doing errands, shopping? N  Preparing Food and eating ? N  Using the Toilet? N  In the past six months, have you accidently leaked urine? N  Do you have problems with loss of bowel control? N  Managing your Medications? N  Managing your Finances? N  Housekeeping or managing your Housekeeping? N  Some recent data might be hidden    Patient Care Team: Dorothyann Peng, NP as PCP - General (Family Medicine) Germaine Pomfret, [facility] as Pharmacist (Pharmacist)  Indicate any recent Medical Services you may have received from other than [facility] providers in the past year (date may be approximate).     Assessment:   This is a routine wellness examination for [place].  Hearing/Vision screen Hearing Screening - Comments:: No difficulty hearing Vision Screening - Comments:: Wears glasses. Followed by Dr Gershon Crane  Dietary issues and exercise activities discussed: Current Exercise Habits: Home exercise routine, Type of exercise: treadmill;walking, Time (Minutes): 60, Frequency (Times/Week): 2, Weekly Exercise (Minutes/Week): 120, Intensity: Moderate   Goals Addressed             This Visit's Progress    Exercise 3x per week (30 min per time)       Continue to walk 2x weekly for 30 min.       Depression Screen PHQ 2/9 Scores 01/23/2022 01/09/2021 09/20/2020 09/20/2019 09/09/2018 08/13/2016 04/16/2016  PHQ - 2 Score 0 0 0 2 6 3 1   PHQ- 9 Score - 0 - 2 7 10  -    Fall Risk Fall Risk  01/23/2022 01/09/2021 09/20/2020 09/20/2019 08/13/2016  Falls in the past year? 0 1 0 0 No  Number falls in past yr: 0 0 - -  -  Injury with Fall? 0 0 - - -  Risk for fall due to : - Impaired balance/gait - - -  Follow up - Falls evaluation completed;Falls prevention discussed - Falls evaluation completed -    FALL RISK PREVENTION PERTAINING TO THE HOME:  Any stairs in or around the home? Yes  If so, are there any without handrails? Yes  Home free of loose throw rugs in walkways, pet beds, electrical cords, etc? Yes  Adequate lighting in your home to reduce risk of falls? Yes   ASSISTIVE DEVICES UTILIZED TO PREVENT FALLS:  Life alert? No  Use of a cane, walker or w/c? No  Grab bars in the bathroom? No  Shower chair or bench in shower? Yes  Elevated toilet seat or a handicapped toilet? No   TIMED UP AND GO:  Was the test performed? Yes .  Length of time to ambulate 10 feet: 5  sec.   Gait steady and fast without use of assistive device  Cognitive Function:    Immunizations Immunization History  Administered Date(s) Administered   Fluad Quad(high Dose 65+) 09/20/2019, 09/20/2020   Influenza Split 10/01/2011, 10/11/2012   Influenza Whole 09/30/2008, 09/27/2009, 09/25/2010   Influenza, High Dose Seasonal PF 09/27/2015, 09/12/2016, 09/24/2017, 09/09/2018   Influenza,inj,Quad PF,6+ Mos 09/14/2013, 09/27/2014, 10/16/2014, 09/24/2021   MODERNA COVID-19 SARS-COV-2 PEDS BIVALENT BOOSTER 6Y-11Y 11/14/2021   Moderna Sars-Covid-2 Vaccination 01/10/2020, 02/20/2020   Pneumococcal Conjugate-13 03/21/2015   Pneumococcal Polysaccharide-23 08/16/2014   Tdap 08/13/2014   Covid-19 vaccine status: Completed vaccines  Qualifies for Shingles Vaccine? Yes   Zostavax completed No   Shingrix Completed?: No.    Education has been provided regarding the importance of this vaccine. Patient has been advised to call insurance company to determine out of pocket expense if they have not yet received this vaccine. Advised may also receive vaccine at local pharmacy or Health Dept. Verbalized acceptance and  understanding.  Screening Tests Health Maintenance  Topic Date Due   HEMOGLOBIN A1C  02/23/2022 (Originally 03/10/2019)   Zoster Vaccines- Shingrix (1 of 2) 04/23/2022 (Originally [birth year]/20/1988)   TETANUS/TDAP  08/13/2024   Pneumonia Vaccine 85+ Years old  Completed   INFLUENZA VACCINE  Completed   DEXA SCAN  Completed   COVID-19 Vaccine  Completed   HPV VACCINES  Aged Out   FOOT EXAM  Discontinued   OPHTHALMOLOGY EXAM  Discontinued   URINE MICROALBUMIN  Discontinued    Health Maintenance    Additional Screening:   Vision Screening: Recommended annual ophthalmology exams for early detection of glaucoma and other disorders of the eye. Is the patient up to date with their annual eye exam?  Yes  Who is the provider or what is the name of the office in which the patient attends annual eye exams? Followed by Dr Warrick Parisian    Dental Screening: Recommended annual dental exams for proper oral hygiene  Community Resource Referral / Chronic Care Management:  CRR required this visit?  No   CCM required this visit?  No     Plan:     I have personally reviewed and noted the following in the patients chart:   Medical and social history Use of alcohol, tobacco or illicit drugs  Current medications and supplements including opioid prescriptions. Patient currently not taking opioids Functional ability and status Nutritional status Physical activity Advanced directives List of other physicians Hospitalizations, surgeries, and ER visits in previous 12 months Vitals Screenings to include cognitive, depression, and falls Referrals and appointments  In addition, I have reviewed and discussed with patient certain preventive protocols, quality metrics, and best practice recommendations. A written personalized care plan for preventive services as well as general preventive health recommendations were provided to patient.     Criselda Peaches, LPN   04/06/8118

## 2022-02-18 DIAGNOSIS — H52203 Unspecified astigmatism, bilateral: Secondary | ICD-10-CM | POA: Diagnosis not present

## 2022-02-18 DIAGNOSIS — Z961 Presence of intraocular lens: Secondary | ICD-10-CM | POA: Diagnosis not present

## 2022-02-18 DIAGNOSIS — H5213 Myopia, bilateral: Secondary | ICD-10-CM | POA: Diagnosis not present

## 2022-02-18 DIAGNOSIS — H524 Presbyopia: Secondary | ICD-10-CM | POA: Diagnosis not present

## 2022-02-21 ENCOUNTER — Other Ambulatory Visit: Payer: Self-pay | Admitting: Adult Health

## 2022-02-21 DIAGNOSIS — F411 Generalized anxiety disorder: Secondary | ICD-10-CM

## 2022-02-21 NOTE — Telephone Encounter (Signed)
Okay for refill?    LOV  09/24/2021   ALPRAZolam (XANAX) 1 MG tablet 60 tablet 2 09/24/2021   Sig:   Take 1 tablet (1 mg total) by mouth 2 (two) times daily as needed. for anxiety

## 2022-02-22 NOTE — Progress Notes (Deleted)
Patient ID: [name], female   DOB: 30-Sep-1936, 86 y.o.   MRN: [id number]  ? ? ? ?86 y.o. with history of benign palpitations since 2011.  History of RBBB/LAFB  and murmur of AV sclerosis Monitor at that time showed long RP tachycardia Rx with beta blocker Seen by Dr Rayann Heman EP no other Rx needed. ? ?Sees Pulmonary for loculated left pleural effusion and 51mm lung nodule.  F/U CT September 2016 improved after 3 weeks Augmentin Rx ? ?Widowed for  > 20 years but worries about her 6 kids/grand kids   ?All living in [place] RX anxiety with xanax and Celexa ? ?*** ? ?ROS: Denies fever, malais, weight loss, blurry vision, decreased visual acuity, cough, sputum, SOB, hemoptysis, pleuritic pain, palpitaitons, heartburn, abdominal pain, melena, lower extremity edema, claudication, or rash.  All other systems reviewed and negative ? ?General: ?Anxious black female  ?Healthy:  appears younger than stated age ?HEENT: normal ?Neck supple with no adenopathy ?JVP normal no bruits no thyromegaly ?Lungs clear with no wheezing and good diaphragmatic motion ?Heart:  S1/S2 SEM  murmur, no rub, gallop or click ?PMI normal ?Abdomen: benighn, BS positve, no tenderness, no AAA ?no bruit.  No HSM or HJR ?Distal pulses intact with no bruits ?No edema ?Neuro non-focal ?Skin warm and dry ?No muscular weakness ? ? ?Current Outpatient Medications  ?Medication Sig Dispense Refill  ? acetaminophen (TYLENOL) 650 MG CR tablet Take 650 mg by mouth every 8 (eight) hours as needed for pain (arthritis). (Patient not taking: Reported on 09/24/2021)    ? ALPRAZolam (XANAX) 1 MG tablet Take 1 tablet by mouth twice daily as needed for anxiety 60 tablet 2  ? amoxicillin-clavulanate (AUGMENTIN) 875-125 MG tablet Take 1 tablet by mouth every 12 (twelve) hours. (Patient not taking: Reported on 01/23/2022) 14 tablet 0  ? Ascorbic Acid (VITAMIN C) 100 MG tablet Take 100 mg by mouth daily.    ? Calcium Carbonate-Vitamin D 600-400 MG-UNIT chew tablet Chew  1 tablet by mouth 2 (two) times daily.    ? cholecalciferol (VITAMIN D3) 25 MCG (1000 UNIT) tablet Take 1,000 Units by mouth daily.    ? citalopram (CELEXA) 20 MG tablet Take 1 tablet by mouth once daily 90 tablet 0  ? Cyanocobalamin (VITAMIN B 12 PO) Take 100 mcg by mouth daily.    ? esomeprazole (NEXIUM) 40 MG capsule Take 1 capsule (40 mg total) by mouth daily at 12 noon. 30 capsule 3  ? fluticasone (FLONASE) 50 MCG/ACT nasal spray Place 1 spray into both nostrils 2 (two) times daily. 16 g 0  ? magnesium hydroxide (MILK OF MAGNESIA) 400 MG/5ML suspension Take 5 mLs by mouth daily as needed for mild constipation.    ? meloxicam (MOBIC) 7.5 MG tablet Take 1 tablet (7.5 mg total) by mouth daily. 90 tablet 1  ? metoprolol tartrate (LOPRESSOR) 25 MG tablet Take 1 tablet by mouth twice daily 180 tablet 0  ? Multiple Vitamin (MULTIVITAMIN) tablet Take 1 tablet by mouth daily.    ? POTASSIUM GLUCONATE PO Take 2 tablets by mouth.    ? vitamin E 1000 UNIT capsule Take 1,000 Units by mouth daily.    ? zinc gluconate 50 MG tablet Take 50 mg by mouth daily.    ? ?No current facility-administered medications for this visit.  ? ? ?Allergies ? ?Celebrex [celecoxib] ? ?Electrocardiogram:   02/22/2022 SR LAFB/RBBB rate 62 no change  ? ?Assessment and Plan ? ?SVT stable on beta blocker no need  to entertain ablation at this point in time   ? ?Pulm:  nodules/effusion quiescent CT  2016 with improved inflammatory changes ? Infectious resolved  ? ?Anxiety: now on zoloft and xanax  f/u Dr Raliegh Ip  ? ?Murmur:   Benign SEM AV sclerosis no need for echo  ? ?Abnormal ECG: chronic bifascicular block no high grade AV block yearly ECG  ? ?F/U in a year  ? ?[name] ? ?

## 2022-02-27 ENCOUNTER — Ambulatory Visit: Payer: Medicare Other | Admitting: Cardiovascular Disease

## 2022-03-12 ENCOUNTER — Telehealth: Payer: Self-pay | Admitting: Pharmacist

## 2022-03-12 NOTE — Chronic Care Management (AMB) (Signed)
? ? ?Chronic Care Management ?Pharmacy Assistant  ? ?Name: [name]  MRN: [id number] DOB: 11/11/1936 ? ?Reason for Encounter: Disease State / Hypertension Assessment Call ?  ?Conditions to be addressed/monitored: ?HTN ? ?Recent office visits:  ?09/24/2021 [name] - Patient was seen for Routine general medical examination and additional issues. Discontinued Aspirin. Follow up in 1 year.  ? ?Recent consult visits:  ?None ? ?Hospital visits:  ?None ? ?Medications: ?Outpatient Encounter Medications as of 03/12/2022  ?Medication Sig  ? acetaminophen (TYLENOL) 650 MG CR tablet Take 650 mg by mouth every 8 (eight) hours as needed for pain (arthritis). (Patient not taking: Reported on 09/24/2021)  ? ALPRAZolam (XANAX) 1 MG tablet Take 1 tablet by mouth twice daily as needed for anxiety  ? amoxicillin-clavulanate (AUGMENTIN) 875-125 MG tablet Take 1 tablet by mouth every 12 (twelve) hours. (Patient not taking: Reported on 01/23/2022)  ? Ascorbic Acid (VITAMIN C) 100 MG tablet Take 100 mg by mouth daily.  ? Calcium Carbonate-Vitamin D 600-400 MG-UNIT chew tablet Chew 1 tablet by mouth 2 (two) times daily.  ? cholecalciferol (VITAMIN D3) 25 MCG (1000 UNIT) tablet Take 1,000 Units by mouth daily.  ? citalopram (CELEXA) 20 MG tablet Take 1 tablet by mouth once daily  ? Cyanocobalamin (VITAMIN B 12 PO) Take 100 mcg by mouth daily.  ? esomeprazole (NEXIUM) 40 MG capsule Take 1 capsule (40 mg total) by mouth daily at 12 noon.  ? fluticasone (FLONASE) 50 MCG/ACT nasal spray Place 1 spray into both nostrils 2 (two) times daily.  ? magnesium hydroxide (MILK OF MAGNESIA) 400 MG/5ML suspension Take 5 mLs by mouth daily as needed for mild constipation.  ? meloxicam (MOBIC) 7.5 MG tablet Take 1 tablet (7.5 mg total) by mouth daily.  ? metoprolol tartrate (LOPRESSOR) 25 MG tablet Take 1 tablet by mouth twice daily  ? Multiple Vitamin (MULTIVITAMIN) tablet Take 1 tablet by mouth daily.  ? POTASSIUM GLUCONATE PO Take 2  tablets by mouth.  ? vitamin E 1000 UNIT capsule Take 1,000 Units by mouth daily.  ? zinc gluconate 50 MG tablet Take 50 mg by mouth daily.  ? ?No facility-administered encounter medications on file as of 03/12/2022.  ?Fill History: ?ALPRAZolam '1MG'$       TAB 02/21/2022 30  ? ?CITALOPRAM '20MG'$      TAB 01/03/2022 90  ? ?METOPROLOL TARTRATE '25MG'$  TAB 01/03/2022 90  ? ?Reviewed chart prior to disease state call. Spoke with patient regarding BP ? ?Recent Office Vitals: ?BP Readings from Last 3 Encounters:  ?01/23/22 118/62  ?12/28/21 117/71  ?09/24/21 120/60  ? ?Pulse Readings from Last 3 Encounters:  ?01/23/22 67  ?12/28/21 74  ?09/24/21 (!) 59  ?  ?Wt Readings from Last 3 Encounters:  ?01/23/22 142 lb (64.4 kg)  ?09/24/21 145 lb (65.8 kg)  ?06/01/21 143 lb (64.9 kg)  ?  ? ?Kidney Function ?Lab Results  ?Component Value Date/Time  ? CREATININE 0.54 09/24/2021 10:33 AM  ? CREATININE 0.50 (L) 09/20/2020 11:16 AM  ? CREATININE 0.48 09/20/2019 11:19 AM  ? GFR 84.99 09/24/2021 10:33 AM  ? GFRNONAA 90 09/20/2020 11:16 AM  ? GFRAA 104 09/20/2020 11:16 AM  ? ? ?BMP Latest Ref Rng & Units 09/24/2021 09/20/2020 09/20/2019  ?Glucose 70 - 99 mg/dL 94 91 93  ?BUN 6 - 23 mg/dL '13 10 11  '$ ?Creatinine 0.40 - 1.20 mg/dL 0.54 0.50(L) 0.48  ?BUN/Creat Ratio 6 - 22 (calc) - 20 -  ?Sodium 135 - 145 mEq/L 138  139 139  ?Potassium 3.5 - 5.1 mEq/L 4.1 4.8 3.6  ?Chloride 96 - 112 mEq/L 103 105 102  ?CO2 19 - 32 mEq/L '29 28 31  '$ ?Calcium 8.4 - 10.5 mg/dL 9.4 9.5 9.6  ? ? ?Current antihypertensive regimen:  ?Metoprolol 25 mg twice daily ? ? ?How often are you checking your Blood Pressure? Patient states she is checking blood pressures every other day.  ? ?Current home BP readings: Patient states she hasn't been good about logging her blood pressures.  She states they are always good around 130/80.  ? ?What recent interventions/DTPs have been made by any provider to improve Blood Pressure control since last CPP Visit: No recent interventions.  ? ?Any recent  hospitalizations or ED visits since last visit with CPP? No recent hospital visits.  ? ?What diet changes have been made to improve Blood Pressure Control?  ?Patient is not following any specific diet.  ?Breakfast - patient will have and egg with cheese  ?Lunch - patient doesn't eat lunch ?Dinner - patient will have a meat vegetable and starch ? ?What exercise is being done to improve your Blood Pressure Control?  ?Patient goes to the [facility] twice weekly, walks and does all her house work. ? ?Adherence Review: ?Is the patient currently on ACE/ARB medication? No ?Does the patient have >5 day gap between last estimated fill dates? No ? ?Care Gaps: ?AWV - scheduled  01/26/2023 ?Last BP - 120/60 on 09/24/2021 ?Last A1C - 5.6 on 09/09/2018 ?  ?Star Rating Drugs: ?None ? ?[name] CMA  ?Clinical Pharmacist Assistant ?[phone] ? ?

## 2022-04-02 ENCOUNTER — Other Ambulatory Visit: Payer: Self-pay | Admitting: Adult Health

## 2022-04-02 ENCOUNTER — Other Ambulatory Visit: Payer: Self-pay | Admitting: Cardiovascular Disease

## 2022-04-02 DIAGNOSIS — I471 Supraventricular tachycardia: Secondary | ICD-10-CM

## 2022-04-08 NOTE — Progress Notes (Signed)
Patient ID: [name], female   DOB: 03/11/1936, 86 y.o.   MRN: [id number]  ? ? ? ?86 y.o. with history of benign palpitations since 2011.  History of RBBB/LAFB  and murmur of AV sclerosis Monitor at that time showed long RP tachycardia Rx with beta blocker Seen by [name] EP no other Rx needed. ? ?Sees Pulmonary for loculated left pleural effusion and 81m lung nodule.  F/U CT September 2016 improved after 3 weeks Augmentin Rx ? ?Widowed for 21 years but worries about her 6 kids/grand kids   ?All living in [place] RX anxiety with xanax and Celexa ? ?Has some back pain and takes Tylenol arthritis  ? ?ROS: Denies fever, malais, weight loss, blurry vision, decreased visual acuity, cough, sputum, SOB, hemoptysis, pleuritic pain, palpitaitons, heartburn, abdominal pain, melena, lower extremity edema, claudication, or rash.  All other systems reviewed and negative ? ?General: ?Anxious black female  ?Healthy:  appears younger than stated age ?HEENT: normal ?Neck supple with no adenopathy ?JVP normal no bruits no thyromegaly ?Lungs clear with no wheezing and good diaphragmatic motion ?Heart:  S1/S2 SEM  murmur, no rub, gallop or click ?PMI normal ?Abdomen: benighn, BS positve, no tenderness, no AAA ?no bruit.  No HSM or HJR ?Distal pulses intact with no bruits ?No edema ?Neuro non-focal ?Skin warm and dry ?No muscular weakness ? ? ?Current Outpatient Medications  ?Medication Sig Dispense Refill  ? acetaminophen (TYLENOL) 650 MG CR tablet Take 650 mg by mouth every 8 (eight) hours as needed for pain (arthritis).    ? ALPRAZolam (XANAX) 1 MG tablet Take 1 tablet by mouth twice daily as needed for anxiety 60 tablet 2  ? amoxicillin-clavulanate (AUGMENTIN) 875-125 MG tablet Take 1 tablet by mouth every 12 (twelve) hours. 14 tablet 0  ? Ascorbic Acid (VITAMIN C) 100 MG tablet Take 100 mg by mouth daily.    ? Calcium Carbonate-Vitamin D 600-400 MG-UNIT chew tablet Chew 1 tablet by mouth 2 (two) times daily.    ?  cholecalciferol (VITAMIN D3) 25 MCG (1000 UNIT) tablet Take 1,000 Units by mouth daily.    ? citalopram (CELEXA) 20 MG tablet Take 1 tablet by mouth once daily 90 tablet 0  ? Cyanocobalamin (VITAMIN B 12 PO) Take 100 mcg by mouth daily.    ? esomeprazole (NEXIUM) 40 MG capsule Take 1 capsule (40 mg total) by mouth daily at 12 noon. 30 capsule 3  ? fluticasone (FLONASE) 50 MCG/ACT nasal spray Place 1 spray into both nostrils 2 (two) times daily. 16 g 0  ? magnesium hydroxide (MILK OF MAGNESIA) 400 MG/5ML suspension Take 5 mLs by mouth daily as needed for mild constipation.    ? meloxicam (MOBIC) 7.5 MG tablet Take 1 tablet (7.5 mg total) by mouth daily. 90 tablet 1  ? metoprolol tartrate (LOPRESSOR) 25 MG tablet Take 1 tablet by mouth twice daily 180 tablet 0  ? Multiple Vitamin (MULTIVITAMIN) tablet Take 1 tablet by mouth daily.    ? POTASSIUM GLUCONATE PO Take 2 tablets by mouth.    ? vitamin E 1000 UNIT capsule Take 1,000 Units by mouth daily.    ? zinc gluconate 50 MG tablet Take 50 mg by mouth daily.    ? ?No current facility-administered medications for this visit.  ? ? ?Allergies ? ?Celebrex [celecoxib] ? ?Electrocardiogram:   04/16/2022 SR LAFB/RBBB rate 59 no change  ? ?Assessment and Plan ? ?SVT stable on beta blocker no need to entertain ablation at this point  in time   ? ?Pulm:  quiescent CT  2016 with improved inflammatory changes  ? ?Anxiety: now on zoloft and xanax  f/u Roberta [name]  ? ?Murmur:   Benign SEM AV sclerosis no need for echo  ? ?Abnormal ECG: chronic bifascicular block no high grade AV block yearly ECG  ? ?F/U in a year  ? ?[name] ? ?

## 2022-04-16 ENCOUNTER — Encounter: Payer: Self-pay | Admitting: Cardiovascular Disease

## 2022-04-16 ENCOUNTER — Ambulatory Visit (INDEPENDENT_AMBULATORY_CARE_PROVIDER_SITE_OTHER): Payer: Medicare Other | Admitting: Cardiovascular Disease

## 2022-04-16 VITALS — BP 116/64 | HR 59 | Ht 66.0 in | Wt 145.6 lb

## 2022-04-16 DIAGNOSIS — R9431 Abnormal electrocardiogram [ECG] [EKG]: Secondary | ICD-10-CM | POA: Diagnosis not present

## 2022-04-16 DIAGNOSIS — I471 Supraventricular tachycardia, unspecified: Secondary | ICD-10-CM

## 2022-04-16 DIAGNOSIS — R011 Cardiac murmur, unspecified: Secondary | ICD-10-CM | POA: Diagnosis not present

## 2022-04-16 NOTE — Patient Instructions (Signed)

## 2022-04-28 ENCOUNTER — Telehealth: Payer: Self-pay | Admitting: Pharmacist

## 2022-04-28 NOTE — Chronic Care Management (AMB) (Signed)
? ? ?  Chronic Care Management ?Pharmacy Assistant  ? ?Name: [name]  MRN: [id number] DOB: September 23, 1936 ? ?04/29/2022 APPOINTMENT REMINDER ? ?[name] was reminded to have all medications, supplements and any blood glucose and blood pressure readings available for review with [name], Pharm. D, at her office visit on 04/29/2022 at 1:30. ? ?Care Gaps: ?AWV - scheduled  01/26/2023 ?Last BP - 116/64 on 04/16/2022 ?Last A1C - 5.6 on 09/09/2018 ?  ?Star Rating Drugs: ?None ? ? ?Any gaps in medications fill history? No ? ?[name] CMA  ?Clinical Pharmacist Assistant ?[phone] ? ?

## 2022-04-29 ENCOUNTER — Ambulatory Visit (INDEPENDENT_AMBULATORY_CARE_PROVIDER_SITE_OTHER): Payer: Medicare Other | Admitting: Pharmacist

## 2022-04-29 VITALS — BP 118/70

## 2022-04-29 DIAGNOSIS — F411 Generalized anxiety disorder: Secondary | ICD-10-CM

## 2022-04-29 DIAGNOSIS — I10 Essential (primary) hypertension: Secondary | ICD-10-CM

## 2022-04-29 NOTE — Progress Notes (Signed)
? ?Chronic Care Management ?Pharmacy Note ? ?04/30/2022 ?Name:  [name] MRN:  [id number] DOB:  July 10, 1936 ? ?Summary: ?BP is running lower at home ?BP in office was normal ? ?Recommendations/Changes made from today's visit: ?-Recommended starting supplement calcium citrate 500-600 mg once per day ?-Recommended repeat DEXA ? ?Plan: ?BP assessment in 1-2 months ? ?Subjective: ?[name] is an 86 y.o. year old female who is a primary patient of Nafziger, Tommi Rumps, NP.  The CCM team was consulted for assistance with disease management and care coordination needs.   ? ?Engaged with patient face to face for follow up visit in response to provider referral for pharmacy case management and/or care coordination services.  ? ?Consent to Services:  ?The patient was given information about Chronic Care Management services, agreed to services, and gave verbal consent prior to initiation of services.  Please see initial visit note for detailed documentation.  ? ?Patient Care Team: ?Dorothyann Peng, NP as PCP - General (Family Medicine) ?Josue Hector, MD as PCP - Cardiology (Cardiology) ?Viona Gilmore, [facility] as Pharmacist (Pharmacist) ? ?Recent office visits: ?01/23/22 Rolene Arbour, LPN: Patient presented for AWV. ? ?09/24/2021 [name] - Patient was seen for Routine general medical examination and additional issues. Discontinued Aspirin. Follow up in 1 year.  ? ?Recent consult visits: ?04/16/22 [name], MD (cardiology): Patient presented for SVT follow up. No medication changes. Follow up in 1 year. ? ?02/18/22 Rutherford Guys, MD (eye care): Patient presented for eye exam. ? ?Hospital visits: ?12/28/21 Patient presented to [facility] at [facility] for 54 minutes for cough. Prescribed Augmentin and Flonase nasal spray. ? ?Objective: ? ?Lab Results  ?Component Value Date  ? CREATININE 0.54 09/24/2021  ? BUN 13 09/24/2021  ? GFR 84.99 09/24/2021  ? GFRNONAA 90 09/20/2020  ? GFRAA 104  09/20/2020  ? NA 138 09/24/2021  ? K 4.1 09/24/2021  ? CALCIUM 9.4 09/24/2021  ? CO2 29 09/24/2021  ? GLUCOSE 94 09/24/2021  ? ? ?Lab Results  ?Component Value Date/Time  ? HGBA1C 5.6 09/09/2018 08:25 AM  ? HGBA1C 5.8 08/25/2017 10:20 AM  ? HGBA1C 6.0 08/13/2016 10:18 AM  ? GFR 84.99 09/24/2021 10:33 AM  ? GFR 149.94 09/20/2019 11:19 AM  ? MICROALBUR 0.7 08/25/2017 10:20 AM  ? MICROALBUR 0.8 08/13/2016 10:18 AM  ?  ?Last diabetic Eye exam: No results found for: HMDIABEYEEXA  ?Last diabetic Foot exam: No results found for: HMDIABFOOTEX  ? ?Lab Results  ?Component Value Date  ? CHOL 153 09/24/2021  ? HDL 67.00 09/24/2021  ? [place] 76 09/24/2021  ? LDLDIRECT 78.4 04/18/2010  ? TRIG 51.0 09/24/2021  ? CHOLHDL 2 09/24/2021  ? ? ? ?  Latest Ref Rng & Units 09/24/2021  ? 10:33 AM 09/20/2020  ? 11:16 AM 09/20/2019  ? 11:19 AM  ?Hepatic Function  ?Total Protein 6.0 - 8.3 g/dL 6.7   6.8   6.8    ?Albumin 3.5 - 5.2 g/dL 3.9    4.0    ?AST 0 - 37 U/L _0 ?ALT 0 - 35 U/L _1 ?Alk Phosphatase 39 - 117 U/L 121    122    ?Total Bilirubin 0.2 - 1.2 mg/dL 0.7   0.6   0.7    ? ? ?Lab Results  ?Component Value Date/Time  ? TSH 1.49 09/24/2021 10:33 AM  ? TSH 1.75 09/20/2020  11:16 AM  ? FREET4 0.88 03/21/2015 02:43 PM  ? ? ? ?  Latest Ref Rng & Units 09/24/2021  ? 10:33 AM 09/20/2020  ? 11:16 AM 09/20/2019  ? 11:19 AM  ?CBC  ?WBC 4.0 - 10.5 K/uL 4.2   5.1   5.2    ?Hemoglobin 12.0 - 15.0 g/dL 12.2   12.7   12.6    ?Hematocrit 36.0 - 46.0 % 36.9   38.5   38.0    ?Platelets 150.0 - 400.0 K/uL 242.0   252   242.0    ? ? ?Lab Results  ?Component Value Date/Time  ? VD25OH 49 04/18/2010 09:25 PM  ? ? ?Clinical ASCVD: No  ?The ASCVD Risk score (Arnett DK, et al., 2019) failed to calculate for the following reasons: ?  The 2019 ASCVD risk score is only valid for ages 26 to 65   ? ? ?  01/23/2022  ?  1:14 PM 01/09/2021  ? 10:40 AM 09/20/2020  ? 11:12 AM  ?Depression screen PHQ 2/9  ?Decreased Interest 0 0 0  ?Down, Depressed,  Hopeless 0 0 0  ?PHQ - 2 Score 0 0 0  ?Altered sleeping  0   ?Tired, decreased energy  0   ?Change in appetite  0   ?Feeling bad or failure about yourself   0   ?Trouble concentrating  0   ?Moving slowly or fidgety/restless  0   ?Suicidal thoughts  0   ?PHQ-9 Score  0   ?Difficult doing work/chores  Not difficult at all   ?  ? ? ?Social History  ? ?Tobacco Use  ?Smoking Status Never  ?Smokeless Tobacco Never  ? ?BP Readings from Last 3 Encounters:  ?04/29/22 118/70  ?04/16/22 116/64  ?01/23/22 118/62  ? ?Pulse Readings from Last 3 Encounters:  ?04/16/22 (!) 59  ?01/23/22 67  ?12/28/21 74  ? ?Wt Readings from Last 3 Encounters:  ?04/16/22 145 lb 9.6 oz (66 kg)  ?01/23/22 142 lb (64.4 kg)  ?09/24/21 145 lb (65.8 kg)  ? ?BMI Readings from Last 3 Encounters:  ?04/16/22 23.50 kg/m?  ?01/23/22 23.63 kg/m?  ?09/24/21 24.13 kg/m?  ? ? ?Assessment/Interventions: Review of patient past medical history, allergies, medications, health status, including review of consultants reports, laboratory and other test data, was performed as part of comprehensive evaluation and provision of chronic care management services.  ? ?SDOH:  (Social Determinants of Health) assessments and interventions performed: No ? ?SDOH Screenings  ? ?Alcohol Screen: Not on file  ?Depression (PHQ2-9): Low Risk   ? PHQ-2 Score: 0  ?Financial Resource Strain: Low Risk   ? Difficulty of Paying Living Expenses: Not hard at all  ?Food Insecurity: No Food Insecurity  ? Worried About Charity fundraiser in the Last Year: Never true  ? Ran Out of Food in the Last Year: Never true  ?Housing: Low Risk   ? Last Housing Risk Score: 0  ?Physical Activity: Insufficiently Active  ? Days of Exercise per Week: 2 days  ? Minutes of Exercise per Session: 60 min  ?Social Connections: Moderately Integrated  ? Frequency of Communication with Friends and Family: More than three times a week  ? Frequency of Social Gatherings with Friends and Family: More than three times a week  ?  Attends Religious Services: More than 4 times per year  ? Active Member of Clubs or Organizations: Yes  ? Attends Archivist Meetings: More than 4 times per year  ? Marital Status:  Widowed  ?Stress: No Stress Concern Present  ? Feeling of Stress : Only a little  ?Tobacco Use: Low Risk   ? Smoking Tobacco Use: Never  ? Smokeless Tobacco Use: Never  ? Passive Exposure: Not on file  ?Transportation Needs: No Transportation Needs  ? Lack of Transportation (Medical): No  ? Lack of Transportation (Non-Medical): No  ? ? ?CCM Care Plan ? ?Allergies  ?Allergen Reactions  ? Celebrex [Celecoxib] Rash  ?  REACTION: rash  ? ? ?Medications Reviewed Today   ? ? Reviewed by Viona Gilmore, [place] (Pharmacist) on 04/30/22 at [place] List Status: <None>  ? ?Medication Order Taking? Sig Documenting Provider Last Dose Status Informant  ?acetaminophen (TYLENOL) 650 MG CR tablet [id number] Yes Take 650 mg by mouth every 8 (eight) hours as needed for pain (arthritis). [provider] Taking Active   ?ALPRAZolam (XANAX) 1 MG tablet [id number] Yes Take 1 tablet by mouth twice daily as needed for anxiety Dorothyann Peng, NP Taking Active   ?Ascorbic Acid (VITAMIN C) 500 MG CAPS [id number] Yes Take 500 mg by mouth every other day. [provider] Taking Active   ?cholecalciferol (VITAMIN D3) 25 MCG (1000 UNIT) tablet [id number] Yes Take 1,000 Units by mouth daily. [provider] Taking Active   ?citalopram (CELEXA) 20 MG tablet [id number] Yes Take 1 tablet by mouth once daily Nafziger, Tommi Rumps, NP Taking Active   ?Cyanocobalamin (VITAMIN B 12 PO) [id number] Yes Take 100 mcg by mouth daily. [provider] Taking Active   ?esomeprazole (NEXIUM) 40 MG capsule [id number] Yes Take 1 capsule (40 mg total) by mouth daily at 12 noon. Marletta Lor, MD Taking Active   ?fluticasone [facility]) 50 MCG/ACT nasal spray [id number] Yes Place 1 spray into both nostrils 2 (two) times daily.  ?Patient taking  differently: Place 1 spray into both nostrils 2 (two) times daily as needed.  ? Volney American, PA-C Taking Active   ?magnesium hydroxide (MILK OF MAGNESIA) 400 MG/5ML suspension [id number] Yes Take 5 mLs by mouth

## 2022-05-28 DIAGNOSIS — M8580 Other specified disorders of bone density and structure, unspecified site: Secondary | ICD-10-CM

## 2022-05-28 DIAGNOSIS — I10 Essential (primary) hypertension: Secondary | ICD-10-CM

## 2022-06-11 ENCOUNTER — Telehealth: Payer: Self-pay | Admitting: Pharmacist

## 2022-06-11 NOTE — Chronic Care Management (AMB) (Cosign Needed)
    Chronic Care Management Pharmacy Assistant   Name: [name]  MRN: [id number] DOB: [birth year]  Reason for Encounter: Follow up blood pressures, low home readings with dizziness.   At visit on 04/29/2022 with [place] Pharmacist, patient stated she was having episodes of dizziness and having low blood pressure readings at home around 129/50's-60's.   Patient has been checking blood pressures:  Patient states she has been checking blood pressures 2 times per week.  Patients current blood pressure readings have been: BP without dizziness: patient states she didn't have any logged readings of blood pressures without dizziness but states they are the same between 100/55 - 110/59 BP with dizziness: patient states the readings when she had dizziness were 109/59, 107/57, 105/55 and 95/56, she has only had these 4 episodes.  Notes: Patient states she has very few bouts of dizziness, she states this only happens when she has waited too long to eat and when she stands up to fast.  Patient is scheduled 07/09/2022 to bring her cuff in for comparison.  Medications: Outpatient Encounter Medications as of 06/11/2022  Medication Sig   acetaminophen (TYLENOL) 650 MG CR tablet Take 650 mg by mouth every 8 (eight) hours as needed for pain (arthritis).   ALPRAZolam (XANAX) 1 MG tablet Take 1 tablet by mouth twice daily as needed for anxiety   Ascorbic Acid (VITAMIN C) 500 MG CAPS Take 500 mg by mouth every other day.   cholecalciferol (VITAMIN D3) 25 MCG (1000 UNIT) tablet Take 1,000 Units by mouth daily.   citalopram (CELEXA) 20 MG tablet Take 1 tablet by mouth once daily   Cyanocobalamin (VITAMIN B 12 PO) Take 100 mcg by mouth daily.   esomeprazole (NEXIUM) 40 MG capsule Take 1 capsule (40 mg total) by mouth daily at 12 noon.   fluticasone (FLONASE) 50 MCG/ACT nasal spray Place 1 spray into both nostrils 2 (two) times daily. (Patient taking differently: Place 1 spray into both  nostrils 2 (two) times daily as needed.)   magnesium hydroxide (MILK OF MAGNESIA) 400 MG/5ML suspension Take 5 mLs by mouth daily as needed for mild constipation.   meloxicam (MOBIC) 7.5 MG tablet Take 1 tablet (7.5 mg total) by mouth daily.   metoprolol tartrate (LOPRESSOR) 25 MG tablet Take 1 tablet by mouth twice daily   Multiple Vitamin (MULTIVITAMIN) tablet Take 1 tablet by mouth daily.   POTASSIUM GLUCONATE PO Take 2 tablets by mouth.   zinc gluconate 50 MG tablet Take 50 mg by mouth daily.   No facility-administered encounter medications on file as of 06/11/2022.   Care Gaps: AWV - scheduled  01/26/2023 Last BP - 116/64 on 04/16/2022 Last A1C - 5.6 on 09/09/2018 Shingrix - never done [facility] - overdue Covid booster - overdue   Star Rating Drugs: None  [place] Pharmacist Assistant [phone]

## 2022-06-17 ENCOUNTER — Other Ambulatory Visit: Payer: Self-pay | Admitting: Adult Health

## 2022-06-17 DIAGNOSIS — F411 Generalized anxiety disorder: Secondary | ICD-10-CM

## 2022-07-04 ENCOUNTER — Ambulatory Visit: Payer: Medicare Other

## 2022-07-07 ENCOUNTER — Other Ambulatory Visit: Payer: Self-pay | Admitting: Cardiovascular Disease

## 2022-07-07 ENCOUNTER — Other Ambulatory Visit: Payer: Self-pay | Admitting: Adult Health

## 2022-07-07 DIAGNOSIS — I471 Supraventricular tachycardia: Secondary | ICD-10-CM

## 2022-07-08 ENCOUNTER — Other Ambulatory Visit: Payer: Self-pay | Admitting: Adult Health

## 2022-07-08 ENCOUNTER — Telehealth: Payer: Self-pay | Admitting: Pharmacist

## 2022-07-08 DIAGNOSIS — Z1231 Encounter for screening mammogram for malignant neoplasm of breast: Secondary | ICD-10-CM

## 2022-07-08 NOTE — Chronic Care Management (AMB) (Signed)
    Chronic Care Management Pharmacy Assistant   Name: [name]  MRN: [id number] DOB: [birth year]  07/09/2022 APPOINTMENT REMINDER  [name] [name] was reminded to have all medications, supplements and any blood glucose and blood pressure readings and blood pressure cuff available for review  with [name], Pharm. D, at her office visit on 07/09/2022 at 12:00.  Care Gaps: AWV - scheduled 01/26/2023 Last BP - 116/64 on 04/16/2022 Shingrix - never done [facility] - overdue Covid booster - overdue  Star Rating Drug: None  Any gaps in medications fill history? No  [name] [facility]  Catering manager [phone]

## 2022-07-09 ENCOUNTER — Other Ambulatory Visit: Payer: Self-pay | Admitting: Adult Health

## 2022-07-09 ENCOUNTER — Ambulatory Visit (INDEPENDENT_AMBULATORY_CARE_PROVIDER_SITE_OTHER): Payer: Medicare Other | Admitting: Pharmacist

## 2022-07-09 DIAGNOSIS — M818 Other osteoporosis without current pathological fracture: Secondary | ICD-10-CM

## 2022-07-09 DIAGNOSIS — I10 Essential (primary) hypertension: Secondary | ICD-10-CM

## 2022-07-09 NOTE — Progress Notes (Signed)
Chronic Care Management Pharmacy Note  07/09/2022 Name:  [name] MRN:  [id number] DOB:  1936-12-02  Summary: BP is low at home and pt does report symptoms of hypotension  Recommendations/Changes made from today's visit: -Recommended decreasing metoprolol dose to 1/2 tablet BID due to hypotension and reached out to cardiology -Recommended repeat DEXA -Recommended checking BP at least twice a week  Plan: BP assessment in 1-2 months Scheduled CPE in Oct   Subjective: [name] is an 86 y.o. year old female who is a primary patient of Dorothyann Peng, NP.  The CCM team was consulted for assistance with disease management and care coordination needs.    Engaged with patient face to face for follow up visit in response to provider referral for pharmacy case management and/or care coordination services.   Consent to Services:  The patient was given information about Chronic Care Management services, agreed to services, and gave verbal consent prior to initiation of services.  Please see initial visit note for detailed documentation.   Patient Care Team: Dorothyann Peng, NP as PCP - General (Family Medicine) Josue Hector, MD as PCP - Cardiology (Cardiology) Viona Gilmore, [facility] as Pharmacist (Pharmacist)  Recent office visits: 01/23/22 Rolene Arbour, LPN: Patient presented for AWV.  09/24/2021 [name] - Patient was seen for Routine general medical examination and additional issues. Discontinued Aspirin. Follow up in 1 year.   Recent consult visits: 04/16/22 [name], MD (cardiology): Patient presented for SVT follow up. No medication changes. Follow up in 1 year.  02/18/22 Rutherford Guys, MD (eye care): Patient presented for eye exam.  Hospital visits: 12/28/21 Patient presented to [facility] for 54 minutes for cough. Prescribed Augmentin and Flonase nasal spray.  Objective:  Lab Results  Component Value Date    CREATININE 0.54 09/24/2021   BUN 13 09/24/2021   GFR 84.99 09/24/2021   GFRNONAA 90 09/20/2020   GFRAA 104 09/20/2020   NA 138 09/24/2021   K 4.1 09/24/2021   CALCIUM 9.4 09/24/2021   CO2 29 09/24/2021   GLUCOSE 94 09/24/2021    Lab Results  Component Value Date/Time   HGBA1C 5.6 09/09/2018 08:25 AM   HGBA1C 5.8 08/25/2017 10:20 AM   HGBA1C 6.0 08/13/2016 10:18 AM   GFR 84.99 09/24/2021 10:33 AM   GFR 149.94 09/20/2019 11:19 AM   MICROALBUR 0.7 08/25/2017 10:20 AM   MICROALBUR 0.8 08/13/2016 10:18 AM    Last diabetic Eye exam: No results found for: "HMDIABEYEEXA"  Last diabetic Foot exam: No results found for: "HMDIABFOOTEX"   Lab Results  Component Value Date   CHOL 153 09/24/2021   HDL 67.00 09/24/2021   LDLCALC 76 09/24/2021   LDLDIRECT 78.4 04/18/2010   TRIG 51.0 09/24/2021   CHOLHDL 2 09/24/2021       Latest Ref Rng & Units 09/24/2021   10:33 AM 09/20/2020   11:16 AM 09/20/2019   11:19 AM  Hepatic Function  Total Protein 6.0 - 8.3 g/dL 6.7  6.8  6.8   Albumin 3.5 - 5.2 g/dL 3.9   4.0   AST 0 - 37 U/L _0 ALT 0 - 35 U/L _1 Alk Phosphatase 39 - 117 U/L 121   122   Total Bilirubin 0.2 - 1.2 mg/dL 0.7  0.6  0.7     Lab Results  Component Value Date/Time   TSH 1.49 09/24/2021 10:33 AM  TSH 1.75 09/20/2020 11:16 AM   FREET4 0.88 03/21/2015 02:43 PM       Latest Ref Rng & Units 09/24/2021   10:33 AM 09/20/2020   11:16 AM 09/20/2019   11:19 AM  CBC  WBC 4.0 - 10.5 K/uL 4.2  5.1  5.2   Hemoglobin 12.0 - 15.0 g/dL 12.2  12.7  12.6   Hematocrit 36.0 - 46.0 % 36.9  38.5  38.0   Platelets 150.0 - 400.0 K/uL 242.0  252  242.0     Lab Results  Component Value Date/Time   VD25OH 49 04/18/2010 09:25 PM    Clinical ASCVD: No  The ASCVD Risk score (Arnett DK, et al., 2019) failed to calculate for the following reasons:   The 2019 ASCVD risk score is only valid for ages 22 to 55       01/23/2022    1:14 PM 01/09/2021   10:40 AM 09/20/2020    11:12 AM  Depression screen PHQ 2/9  Decreased Interest 0 0 0  Down, Depressed, Hopeless 0 0 0  PHQ - 2 Score 0 0 0  Altered sleeping  0   Tired, decreased energy  0   Change in appetite  0   Feeling bad or failure about yourself   0   Trouble concentrating  0   Moving slowly or fidgety/restless  0   Suicidal thoughts  0   PHQ-9 Score  0   Difficult doing work/chores  Not difficult at all       Social History   Tobacco Use  Smoking Status Never  Smokeless Tobacco Never   BP Readings from Last 3 Encounters:  04/29/22 118/70  04/16/22 116/64  01/23/22 118/62   Pulse Readings from Last 3 Encounters:  04/16/22 (!) 59  01/23/22 67  12/28/21 74   Wt Readings from Last 3 Encounters:  04/16/22 145 lb 9.6 oz (66 kg)  01/23/22 142 lb (64.4 kg)  09/24/21 145 lb (65.8 kg)   BMI Readings from Last 3 Encounters:  04/16/22 23.50 kg/m  01/23/22 23.63 kg/m  09/24/21 24.13 kg/m    Assessment/Interventions: Review of patient past medical history, allergies, medications, health status, including review of consultants reports, laboratory and other test data, was performed as part of comprehensive evaluation and provision of chronic care management services.   SDOH:  (Social Determinants of Health) assessments and interventions performed: No  SDOH Screenings   Alcohol Screen: Low Risk  (01/09/2021)   Alcohol Screen    Last Alcohol Screening Score (AUDIT): 0  Depression (PHQ2-9): Low Risk  (01/23/2022)   Depression (PHQ2-9)    PHQ-2 Score: 0  Financial Resource Strain: Low Risk  (01/23/2022)   Overall Financial Resource Strain (CARDIA)    Difficulty of Paying Living Expenses: Not hard at all  Food Insecurity: No Food Insecurity (01/23/2022)   Hunger Vital Sign    Worried About Running Out of Food in the Last Year: Never true    Ran Out of Food in the Last Year: Never true  Housing: Low Risk  (01/23/2022)   Housing    Last Housing Risk Score: 0  Physical Activity:  Insufficiently Active (01/23/2022)   Exercise Vital Sign    Days of Exercise per Week: 2 days    Minutes of Exercise per Session: 60 min  Social Connections: Moderately Integrated (01/23/2022)   Social Connection and Isolation Panel [NHANES]    Frequency of Communication with Friends and Family: More than three times a week  Frequency of Social Gatherings with Friends and Family: More than three times a week    Attends Religious Services: More than 4 times per year    Active Member of Clubs or Organizations: Yes    Attends Archivist Meetings: More than 4 times per year    Marital Status: Widowed  Stress: No Stress Concern Present (01/23/2022)   [place]    Feeling of Stress : Only a little  Tobacco Use: Low Risk  (04/16/2022)   Patient History    Smoking Tobacco Use: Never    Smokeless Tobacco Use: Never    Passive Exposure: Not on file  Transportation Needs: No Transportation Needs (01/23/2022)   PRAPARE - Transportation    Lack of Transportation (Medical): No    Lack of Transportation (Non-Medical): No    CCM Care Plan  Allergies  Allergen Reactions   Celebrex [Celecoxib] Rash    REACTION: rash    Medications Reviewed Today     Reviewed by Viona Gilmore, [facility] (Pharmacist) on 04/30/22 at [place] List Status: <None>   Medication Order Taking? Sig Documenting Provider Last Dose Status Informant  acetaminophen (TYLENOL) 650 MG CR tablet [id number] Yes Take 650 mg by mouth every 8 (eight) hours as needed for pain (arthritis). [provider] Taking Active   ALPRAZolam Duanne Moron) 1 MG tablet [id number] Yes Take 1 tablet by mouth twice daily as needed for anxiety Nafziger, Tommi Rumps, NP Taking Active   Ascorbic Acid (VITAMIN C) 500 MG CAPS [id number] Yes Take 500 mg by mouth every other day. [provider] Taking Active   cholecalciferol (VITAMIN D3) 25 MCG (1000 UNIT) tablet [id number] Yes  Take 1,000 Units by mouth daily. [provider] Taking Active   citalopram (CELEXA) 20 MG tablet [id number] Yes Take 1 tablet by mouth once daily Nafziger, Tommi Rumps, NP Taking Active   Cyanocobalamin (VITAMIN B 12 PO) [id number] Yes Take 100 mcg by mouth daily. [provider] Taking Active   esomeprazole (NEXIUM) 40 MG capsule [id number] Yes Take 1 capsule (40 mg total) by mouth daily at 12 noon. Marletta Lor, MD Taking Active   fluticasone [facility]) 50 MCG/ACT nasal spray [id number] Yes Place 1 spray into both nostrils 2 (two) times daily.  Patient taking differently: Place 1 spray into both nostrils 2 (two) times daily as needed.   [place]ney [employer], [place] Taking Active   magnesium hydroxide (MILK OF MAGNESIA) 400 MG/5ML suspension [id number] Yes Take 5 mLs by mouth daily as needed for mild constipation. [provider] Taking Active   meloxicam (MOBIC) 7.5 MG tablet [id number] Yes Take 1 tablet (7.5 mg total) by mouth daily. Nafziger, Tommi Rumps, NP Taking Active   metoprolol tartrate (LOPRESSOR) 25 MG tablet [id number] Yes Take 1 tablet by mouth twice daily Josue Hector, MD Taking Active   Multiple Vitamin (MULTIVITAMIN) tablet [id number] Yes Take 1 tablet by mouth daily. [provider] Taking Active Self  POTASSIUM GLUCONATE PO [id number] Yes Take 2 tablets by mouth. [provider] Taking Active   zinc gluconate 50 MG tablet [id number] Yes Take 50 mg by mouth daily. [provider] Taking Active             Patient Active Problem List   Diagnosis Date Noted   SVT (supraventricular tachycardia) ([place]) 03/13/2019   Impaired glucose tolerance 11/26/2015   Lung nodule 06/22/2015   Thyroid nodule, uninodular, isthmus 08/01/2014   Bifascicular block 05/12/2011  Hiatal hernia    Essential hypertension 10/29/2010   RBBB 09/05/2010   DIVERTICULAR DISEASE 09/03/2010   VENTRICULAR HYPERTROPHY, LEFT 08/12/2010   Insomnia 08/12/2010   HEART  MURMUR, HX OF 85/46/2703   LICHEN SIMPLEX CHRONICUS 01/17/2010   Osteoporosis 05/17/2009   Generalized anxiety disorder 08/30/2008   Personal hx of colon adenomas 06/02/2008   Hyperlipidemia 12/09/2007   LOW BACK PAIN 12/09/2007   B12 deficiency 09/02/2007   GERD 09/02/2007   Asthma 08/10/2007    Immunization History  Administered Date(s) Administered   Fluad Quad(high Dose 65+) 09/20/2019, 09/20/2020   Influenza Split 10/01/2011, 10/11/2012   Influenza Whole 09/30/2008, 09/27/2009, 09/25/2010   Influenza, High Dose Seasonal PF 09/27/2015, 09/12/2016, 09/24/2017, 09/09/2018   Influenza,inj,Quad PF,6+ Mos 09/14/2013, 09/27/2014, 10/16/2014, 09/24/2021   MODERNA COVID-19 SARS-COV-2 PEDS BIVALENT BOOSTER 6Y-11Y 11/14/2021   Moderna Sars-Covid-2 Vaccination 01/10/2020, 02/20/2020   Pneumococcal Conjugate-13 03/21/2015   Pneumococcal Polysaccharide-23 08/16/2014   Tdap 08/13/2014   Patient does check her blood pressure at home. She does feel dizzy sometimes and wanted to know if this was from medications. Patient does drink water every day.  Conditions to be addressed/monitored:  Hypertension, Hyperlipidemia, GERD, Anxiety, and Osteoporosis  Conditions addressed this visit: Hypertension, osteopenia  Care Plan : CCM Pharmacy Care Plan  Updates made by Viona Gilmore, [place] since 07/09/2022 12:00 AM     Problem: Problem: Hypertension, Hyperlipidemia, GERD, Anxiety, and Osteoporosis      Long-Range Goal: Patient-Specific Goal   Start Date: 04/29/2022  Expected End Date: 04/30/2023  Recent Progress: On track  Priority: High  Note:   Current Barriers:  Unable to independently monitor therapeutic efficacy Unable to self administer medications as prescribed  Pharmacist Clinical Goal(s):  Patient will achieve adherence to monitoring guidelines and medication adherence to achieve therapeutic efficacy through collaboration with PharmD and provider.   Interventions: 1:1  collaboration with Dorothyann Peng, NP regarding development and update of comprehensive plan of care as evidenced by provider attestation and co-signature Inter-disciplinary care team collaboration (see longitudinal plan of care) Comprehensive medication review performed; medication list updated in electronic medical record  Hypertension (BP goal <140/90) -Controlled -Current treatment: Metoprolol tartrate 25 mg twice daily  - Appropriate, Effective, Query Safe, Accessible -Medications previously tried: n/a  -Current home readings: 122/62, 129/50-60s, pulse is 60s -Current dietary habits: did not discuss -Current exercise habits: did not discuss -Reports hypotensive/hypertensive symptoms -Educated on BP goals and benefits of medications for prevention of heart attack, stroke and kidney damage; Importance of home blood pressure monitoring; Proper BP monitoring technique; Symptoms of hypotension and importance of maintaining adequate hydration; -Counseled to monitor BP at home a few times a week, document, and provide log at future appointments -Recommended to continue current medication Recommended checking BP when symptomatic.  Anxiety (Goal: minimize symptoms) -Not ideally controlled -Current treatment: Alprazolam 1 mg twice daily as needed (takes 1/2 tab QAM, 1 tab QHS) - Appropriate, Effective, Query Safe, Accessible  Citalopram 20 mg daily - Appropriate, Effective, Safe, Accessible -Medications previously tried/failed: escitalopram, Paroxetine, sertraline, venlafaxine  -PHQ9: 0 -GAD7: n/a -Educated on Benefits of medication for symptom control Benefits of cognitive-behavioral therapy with or without medication -Recommended to continue current medication  Osteopenia (Goal prevent fractures) -Uncontrolled -Last DEXA Scan: 05/16/2015   T-Score femoral neck: -1.6             T-Score total hip: n/a             T-Score lumbar spine: -1.7  T-Score forearm radius: n/a              10-year probability of major osteoporotic fracture: 5.7%             10-year probability of hip fracture: 1.3% -Patient is not a candidate for pharmacologic treatment -Current treatment  Multivitamin 1 tablet daily (300 mg Calcium, 1000 units)- Appropriate, Effective, Safe, Accessible Vitamin D 1000 units 1 tablet daily - Appropriate, Effective, Safe, Accessible -Medications previously tried: none  -Recommend [phone] units of vitamin D daily. Recommend 1200 mg of calcium daily from dietary and supplemental sources. Recommend weight-bearing and muscle strengthening exercises for building and maintaining bone density. -Recommended repeat DEXA scan and vitamin D level.  GERD (Goal: minimize symptoms) -Controlled -Current treatment  Esomeprazole 40 mg 1 capsule as needed - Appropriate, Effective, Safe, Accessible -Medications previously tried: none  -Recommended taking as needed.  Health Maintenance -Vaccine gaps: shingrix -Current therapy:  Vitamin B12 5000 mcg daily Meloxicam 7.5 mg daily as needed Multivitamin daily (Centrum Silver)  Milk of Magnesia as needed Zinc 50 mg as needed Potassium 2 tablets daily  Vitamin C 500 mg 1 capsule every few days -Educated on Cost vs benefit of each product must be carefully weighed by individual consumer -Patient is satisfied with current therapy and denies issues -Recommended to continue current medication  Patient Goals/Self-Care Activities Patient will:  - take medications as prescribed as evidenced by patient report and record review check blood pressure a few times a week, document, and provide at future appointments target a minimum of 150 minutes of moderate intensity exercise weekly  Follow Up Plan: The care management team will reach out to the patient again over the next 30 days.         Medication Assistance: None required.  Patient affirms current coverage meets needs.  Compliance/Adherence/Medication fill  history: Care Gaps: Shingrix, A1c, COVID booster Last BP - 116/64 on 04/16/2022 Last A1C - 5.6 on 09/09/2018  Star-Rating Drugs: None  Patient's preferred pharmacy is:  [place], [place] - [id number]  [address] [place] [address] Phone: [phone] Fax: [phone]  Uses pill box? Yes Pt endorses 99% compliance  We discussed: Current pharmacy is preferred with insurance plan and patient is satisfied with pharmacy services Patient decided to: Continue current medication management strategy  Care Plan and Follow Up Patient Decision:  Patient agrees to Care Plan and Follow-up.  Plan: The care management team will reach out to the patient again over the next 30 days.  [name], PharmD, [place] Pharmacist [place] [facility] [place]

## 2022-07-15 ENCOUNTER — Telehealth: Payer: Self-pay | Admitting: Pharmacist

## 2022-07-15 NOTE — Telephone Encounter (Signed)
Called patient after CCM visit recommendations and discussion with cardiology about low BP readings. Left a voicemail and requested a call back.

## 2022-07-16 NOTE — Telephone Encounter (Signed)
Patient called back. Patient is aware to take 1/2 tablet of metoprolol twice daily due to low blood pressures. She confirmed the correct dosing and has a pill splitter. Updated her medication list to reflect this. Patient will keep a log of her readings and will plan to follow up in 3-4 weeks.

## 2022-07-28 DIAGNOSIS — I10 Essential (primary) hypertension: Secondary | ICD-10-CM

## 2022-07-28 DIAGNOSIS — M818 Other osteoporosis without current pathological fracture: Secondary | ICD-10-CM

## 2022-08-06 ENCOUNTER — Ambulatory Visit: Payer: Medicare Other

## 2022-08-14 ENCOUNTER — Ambulatory Visit
Admission: RE | Admit: 2022-08-14 | Discharge: 2022-08-14 | Disposition: A | Discharge status: Home / Self Care | Payer: Medicare Other | Source: Ambulatory Visit | Attending: Adult Health | Admitting: Adult Health

## 2022-08-14 DIAGNOSIS — Z1231 Encounter for screening mammogram for malignant neoplasm of breast: Secondary | ICD-10-CM | POA: Diagnosis not present

## 2022-09-30 ENCOUNTER — Ambulatory Visit (INDEPENDENT_AMBULATORY_CARE_PROVIDER_SITE_OTHER): Payer: Medicare Other | Admitting: Adult Health

## 2022-09-30 VITALS — BP 120/60 | HR 66 | Temp 97.6°F | Ht 65.0 in | Wt 145.2 lb

## 2022-09-30 DIAGNOSIS — Z23 Encounter for immunization: Secondary | ICD-10-CM

## 2022-09-30 DIAGNOSIS — I10 Essential (primary) hypertension: Secondary | ICD-10-CM

## 2022-09-30 DIAGNOSIS — F411 Generalized anxiety disorder: Secondary | ICD-10-CM

## 2022-09-30 DIAGNOSIS — I471 Supraventricular tachycardia, unspecified: Secondary | ICD-10-CM | POA: Diagnosis not present

## 2022-09-30 DIAGNOSIS — Z0000 Encounter for general adult medical examination without abnormal findings: Secondary | ICD-10-CM

## 2022-09-30 LAB — CBC WITH DIFFERENTIAL/PLATELET
Basophils Absolute: 0 10*3/uL (ref 0.0–0.1)
Basophils Relative: 0.6 % (ref 0.0–3.0)
Eosinophils Absolute: 0.2 10*3/uL (ref 0.0–0.7)
Eosinophils Relative: 4.5 % (ref 0.0–5.0)
HCT: 37.2 % (ref 36.0–46.0)
Hemoglobin: 12.3 g/dL (ref 12.0–15.0)
Lymphocytes Relative: 39.8 % (ref 12.0–46.0)
Lymphs Abs: 1.8 10*3/uL (ref 0.7–4.0)
MCHC: 33.1 g/dL (ref 30.0–36.0)
MCV: 89.4 fl (ref 78.0–100.0)
Monocytes Absolute: 0.4 10*3/uL (ref 0.1–1.0)
Monocytes Relative: 7.9 % (ref 3.0–12.0)
Neutro Abs: 2.1 10*3/uL (ref 1.4–7.7)
Neutrophils Relative %: 47.2 % (ref 43.0–77.0)
Platelets: 231 10*3/uL (ref 150.0–400.0)
RBC: 4.16 Mil/uL (ref 3.87–5.11)
RDW: 14.4 % (ref 11.5–15.5)
WBC: 4.5 10*3/uL (ref 4.0–10.5)

## 2022-09-30 LAB — COMPREHENSIVE METABOLIC PANEL
ALT: 16 U/L (ref 0–35)
AST: 18 U/L (ref 0–37)
Albumin: 4 g/dL (ref 3.5–5.2)
Alkaline Phosphatase: 113 U/L (ref 39–117)
BUN: 11 mg/dL (ref 6–23)
CO2: 28 mEq/L (ref 19–32)
Calcium: 9.5 mg/dL (ref 8.4–10.5)
Chloride: 104 mEq/L (ref 96–112)
Creatinine, Ser: 0.61 mg/dL (ref 0.40–1.20)
GFR: 81.94 mL/min (ref >60.00)
Glucose, Bld: 98 mg/dL (ref 70–99)
Potassium: 3.7 mEq/L (ref 3.5–5.1)
Sodium: 138 mEq/L (ref 135–145)
Total Bilirubin: 0.8 mg/dL (ref 0.2–1.2)
Total Protein: 7.1 g/dL (ref 6.0–8.3)

## 2022-09-30 LAB — LIPID PANEL
Cholesterol: 161 mg/dL (ref 0–200)
HDL: 63 mg/dL (ref >39.00)
LDL Cholesterol: 86 mg/dL (ref 0–99)
NonHDL: 98.15
Total CHOL/HDL Ratio: 3
Triglycerides: 63 mg/dL (ref 0.0–149.0)
VLDL: 12.6 mg/dL (ref 0.0–40.0)

## 2022-09-30 LAB — TSH: TSH: 1.23 u[IU]/mL (ref 0.35–5.50)

## 2022-09-30 NOTE — Patient Instructions (Signed)
It was great seeing you today   We will follow up with you regarding your lab work   Please let me know if you need anything   

## 2022-09-30 NOTE — Progress Notes (Signed)
Subjective:    Patient ID: [name], female    DOB: [birth year], 86 y.o.   MRN: [id number]  HPI Patient presents for yearly preventative medicine examination. She is a pleasant 86 year old female who  has a past medical history of Anxiety state, ARTHRITIS (09/03/2010), Asthma, CHOLELITHIASIS (09/03/2010), COLONIC POLYPS, HX OF (06/02/2008), DIVERTICULAR DISEASE (09/03/2010), DYSPHAGIA UNSPECIFIED (09/03/2010), ESSENTIAL HYPERTENSION (10/29/2010), Fibromyalgia, GALLSTONE PANCREATITIS (09/03/2010), GERD (09/02/2007), HEART MURMUR, HX OF (08/12/2010), Hematuria, Hiatal hernia, colonoscopy, HYPERLIPIDEMIA (25/36/6440), Insomnia, Lichen simplex chronicus, Low back pain, Osteopenia, OSTEOPOROSIS (05/17/2009), Palpitations (09/03/2010), Paresthesia, Polyuria, RBBB (09/05/2010), VENTRICULAR HYPERTROPHY, LEFT (08/12/2010), VITAMIN B12 DEFICIENCY (09/02/2007), and WEIGHT LOSS, ABNORMAL (06/02/2008).   Anxiety-currently managed with Celexa 20 mg daily and Xanax 1 mg twice daily as needed. She reports for the most part she feels controlled but continues to have anxiety related to her kids. She does not have trouble sleeping   GERD-controlled with Nexium 40 mg as needed. She may take a few times a week..   Osteoarthritis-mostly in her lower back and bilateral hands.  She does take meloxicam 7.5 mg as needed and reports that this seems to resolve some of her pain and stiffness  History of SVT-followed by cardiology on a yearly basis.  Currently prescribed Lopressor 25 mg twice daily.  She denies palpitations, chest pain, or shortness of breath  All immunizations and health maintenance protocols were reviewed with the patient and needed orders were placed.  Appropriate screening laboratory values were ordered for the patient including screening of hyperlipidemia, renal function and hepatic function.  Medication reconciliation,  past medical history, social history, problem list and allergies were reviewed in detail with the  patient  Goals were established with regard to weight loss, exercise, and  diet in compliance with medications. She is going to the gym 1-2 times a week to the [facility]. She does eat healthy.   Wt Readings from Last 3 Encounters:  09/30/22 145 lb 3.2 oz (65.9 kg)  04/16/22 145 lb 9.6 oz (66 kg)  01/23/22 142 lb (64.4 kg)    Review of Systems  Constitutional: Negative.   HENT: Negative.    Eyes: Negative.   Respiratory: Negative.    Cardiovascular: Negative.   Gastrointestinal: Negative.   Endocrine: Negative.   Genitourinary: Negative.   Musculoskeletal:  Positive for arthralgias. Negative for gait problem.  Skin: Negative.   Allergic/Immunologic: Negative.   Neurological: Negative.   Hematological: Negative.   Psychiatric/Behavioral:  The patient is nervous/anxious.    Past Medical History:  Diagnosis Date   Anxiety state    unspecified   ARTHRITIS 09/03/2010   Asthma    CHOLELITHIASIS 09/03/2010   COLONIC POLYPS, HX OF 06/02/2008   DIVERTICULAR DISEASE 09/03/2010   DYSPHAGIA UNSPECIFIED 09/03/2010   ESSENTIAL HYPERTENSION 10/29/2010   Fibromyalgia    GALLSTONE PANCREATITIS 09/03/2010   GERD 09/02/2007   HEART MURMUR, HX OF 08/12/2010   Hematuria    Hiatal hernia    Hx of colonoscopy    2014   HYPERLIPIDEMIA 34/74/2595   Insomnia    Lichen simplex chronicus    Low back pain    chronic   Osteopenia    OSTEOPOROSIS 05/17/2009   Palpitations 09/03/2010   event monitor reveals long RP tachycardia, likely sinus tachycardia   Paresthesia    Polyuria    RBBB 09/05/2010   VENTRICULAR HYPERTROPHY, LEFT 08/12/2010   VITAMIN B12 DEFICIENCY 09/02/2007   WEIGHT LOSS, ABNORMAL 06/02/2008    Social History   Socioeconomic History  Marital status: Widowed    Spouse name: Not on file   Number of children: 6   Years of education: Not on file   Highest education level: Not on file  Occupational History   Occupation: retired  Tobacco Use   Smoking status: Never   Smokeless tobacco: Never   Vaping Use   Vaping Use: Never used  Substance and Sexual Activity   Alcohol use: No    Alcohol/week: 0.0 standard drinks of alcohol   Drug use: No   Sexual activity: Never  Other Topics Concern   Not on file  Social History Narrative   Widowed   2 children   Lives in [place]   Retired from [employer] Strain: [place]  (01/23/2022)   Overall Financial Resource Strain (CARDIA)    Difficulty of Paying Living Expenses: Not hard at all  Food Insecurity: No [place] (01/23/2022)   Hunger Vital Sign    Worried About Running Out of Food in the Last Year: Never true    [place] in the Last Year: Never true  Transportation Needs: No Transportation Needs (01/23/2022)   PRAPARE - Hydrologist (Medical): No    Lack of Transportation (Non-Medical): No  Physical Activity: Insufficiently Active (01/23/2022)   Exercise Vital Sign    Days of Exercise per Week: 2 days    Minutes of Exercise per Session: 60 min  Stress: No Stress Concern Present (01/23/2022)   [place]    Feeling of Stress : Only a little  Social Connections: Moderately Integrated (01/23/2022)   Social Connection and Isolation Panel [NHANES]    Frequency of Communication with Friends and Family: More than three times a week    Frequency of Social Gatherings with Friends and Family: More than three times a week    Attends Religious Services: More than 4 times per year    Active Member of [employer] or Organizations: Yes    Attends Archivist Meetings: More than 4 times per year    Marital Status: Widowed  Intimate Partner Violence: Not At Risk (01/23/2022)   Humiliation, Afraid, Rape, and Kick questionnaire    Fear of Current or Ex-Partner: No    Emotionally Abused: No    Physically Abused: No    Sexually Abused: No    Past Surgical History:  Procedure  Laterality Date   ABDOMINAL HYSTERECTOMY     THA and O   APPENDECTOMY     CHOLECYSTECTOMY     ESOPHAGOGASTRODUODENOSCOPY     with HH and esophagitis   OOPHORECTOMY     SPINE SURGERY     cervical fusion    Family History  Problem Relation Age of Onset   Heart disease Mother    Stroke Mother    Prostate cancer Father     Allergies  Allergen Reactions   Celebrex [Celecoxib] Rash    REACTION: rash    Current Outpatient Medications on File Prior to Visit  Medication Sig Dispense Refill   acetaminophen (TYLENOL) 650 MG CR tablet Take 650 mg by mouth every 8 (eight) hours as needed for pain (arthritis).     ALPRAZolam (XANAX) 1 MG tablet Take 1 tablet by mouth twice daily as needed for anxiety 60 tablet 2   Ascorbic Acid (VITAMIN C) 500 MG CAPS Take 500 mg by mouth every other day.  Calcium Citrate-Vitamin D (CALCIUM CITRATE +D PO) Take 1 tablet by mouth daily.     cholecalciferol (VITAMIN D3) 25 MCG (1000 UNIT) tablet Take 1,000 Units by mouth daily.     citalopram (CELEXA) 20 MG tablet Take 1 tablet (20 mg total) by mouth daily. 90 tablet 0   Cyanocobalamin (VITAMIN B 12 PO) Take 100 mcg by mouth daily.     esomeprazole (NEXIUM) 40 MG capsule Take 1 capsule (40 mg total) by mouth daily at 12 noon. 30 capsule 3   fluticasone (FLONASE) 50 MCG/ACT nasal spray Place 1 spray into both nostrils 2 (two) times daily. (Patient taking differently: Place 1 spray into both nostrils 2 (two) times daily as needed.) 16 g 0   magnesium hydroxide (MILK OF MAGNESIA) 400 MG/5ML suspension Take 5 mLs by mouth daily as needed for mild constipation.     meloxicam (MOBIC) 7.5 MG tablet Take 1 tablet (7.5 mg total) by mouth daily. 90 tablet 1   metoprolol tartrate (LOPRESSOR) 25 MG tablet Take 1 tablet by mouth twice daily (Patient taking differently: Take 12.5 mg by mouth 2 (two) times daily.) 180 tablet 2   Multiple Vitamin (MULTIVITAMIN) tablet Take 1 tablet by mouth daily.     POTASSIUM GLUCONATE  PO Take 2 tablets by mouth.     zinc gluconate 50 MG tablet Take 50 mg by mouth daily.     No current facility-administered medications on file prior to visit.    BP 120/60 (BP Location: Left Arm, Patient Position: Sitting, Cuff Size: Normal)   Pulse 66   Temp 97.6 F (36.4 C) (Oral)   Ht '5\' 5"'$  (1.651 m)   Wt 145 lb 3.2 oz (65.9 kg)   SpO2 98%   BMI 24.16 kg/m       Objective:   Physical Exam Vitals and nursing note reviewed.  Constitutional:      General: She is not in acute distress.    Appearance: Normal appearance. She is well-developed. She is not ill-appearing.  HENT:     Head: Normocephalic and atraumatic.     Right Ear: Tympanic membrane, ear canal and external ear normal. There is no impacted cerumen.     Left Ear: Tympanic membrane, ear canal and external ear normal. There is no impacted cerumen.     Nose: Nose normal. No congestion or rhinorrhea.     Mouth/Throat:     Mouth: Mucous membranes are moist.     Pharynx: Oropharynx is clear. No oropharyngeal exudate or posterior oropharyngeal erythema.  Eyes:     General:        Right eye: No discharge.        Left eye: No discharge.     Extraocular Movements: Extraocular movements intact.     Conjunctiva/sclera: Conjunctivae normal.     Pupils: Pupils are equal, round, and reactive to light.  Neck:     Thyroid: No thyromegaly.     Vascular: No carotid bruit.     Trachea: No tracheal deviation.  Cardiovascular:     Rate and Rhythm: Normal rate and regular rhythm.     Pulses: Normal pulses.     Heart sounds: Murmur heard.     No friction rub. No gallop.  Pulmonary:     Effort: Pulmonary effort is normal. No respiratory distress.     Breath sounds: Normal breath sounds. No stridor. No wheezing, rhonchi or rales.  Chest:     Chest wall: No tenderness.  Abdominal:  General: Abdomen is flat. Bowel sounds are normal. There is no distension.     Palpations: Abdomen is soft. There is no mass.     Tenderness:  There is no abdominal tenderness. There is no right CVA tenderness, left CVA tenderness, guarding or rebound.     Hernia: No hernia is present.  Musculoskeletal:        General: No swelling, tenderness, deformity or signs of injury. Normal range of motion.     Cervical back: Normal range of motion and neck supple.     Right lower leg: No edema.     Left lower leg: No edema.  Lymphadenopathy:     Cervical: No cervical adenopathy.  Skin:    General: Skin is warm and dry.     Coloration: Skin is not jaundiced or pale.     Findings: No bruising, erythema, lesion or rash.  Neurological:     General: No focal deficit present.     Mental Status: She is alert and oriented to person, place, and time.     Cranial Nerves: No cranial nerve deficit.     Sensory: No sensory deficit.     Motor: No weakness.     Coordination: Coordination normal.     Gait: Gait normal.     Deep Tendon Reflexes: Reflexes normal.  Psychiatric:        Mood and Affect: Mood normal.        Behavior: Behavior normal.        Thought Content: Thought content normal.        Judgment: Judgment normal.       Assessment & Plan:  1. Routine general medical examination at a health care facility - Continue to stay active and eat healthy  - Follow up in one year or sooner if needed - CBC with Differential/Platelet; Future - Comprehensive metabolic panel; Future - Lipid panel; Future - TSH; Future  2. Need for influenza vaccination  - Flu Vaccine QUAD High Dose(Fluad)  3. Essential hypertension - Continue with current medications . Well controlled.  - CBC with Differential/Platelet; Future - Comprehensive metabolic panel; Future - Lipid panel; Future - TSH; Future  4. Generalized anxiety disorder - Continue with Celexa 20 mg and Xanax 1 mg BID PRN - She does not want to change anything at this time  - CBC with Differential/Platelet; Future - Comprehensive metabolic panel; Future - Lipid panel; Future - TSH;  Future  5. SVT (supraventricular tachycardia) - Per cardiology  - CBC with Differential/Platelet; Future - Comprehensive metabolic panel; Future - Lipid panel; Future - TSH; Future

## 2022-10-04 ENCOUNTER — Other Ambulatory Visit: Payer: Self-pay | Admitting: Adult Health

## 2022-10-04 DIAGNOSIS — I471 Supraventricular tachycardia, unspecified: Secondary | ICD-10-CM

## 2022-10-04 DIAGNOSIS — M545 Low back pain, unspecified: Secondary | ICD-10-CM

## 2022-10-10 ENCOUNTER — Other Ambulatory Visit: Payer: Self-pay | Admitting: Adult Health

## 2022-10-10 DIAGNOSIS — F411 Generalized anxiety disorder: Secondary | ICD-10-CM

## 2022-10-14 NOTE — Telephone Encounter (Signed)
Patient checking on progress of refill ALPRAZolam (XANAX) 1 MG tablet

## 2022-10-31 ENCOUNTER — Telehealth: Payer: Self-pay | Admitting: Pharmacist

## 2022-10-31 NOTE — Chronic Care Management (AMB) (Unsigned)
Chronic Care Management Pharmacy Assistant   Name: [name]  MRN: [id number] DOB: Jun 11, 1936  Reason for Encounter: Disease State / Hypertension Assessment Call   Conditions to be addressed/monitored: HTN  Recent office visits:  09/30/2022 [name] - Patient was seen for  Routine general medical examination at a health care facility and additional concerns. No medication changes. Follow up in 1 year.   Recent consult visits:  None  Hospital visits:  None  Medications: Outpatient Encounter Medications as of 10/31/2022  Medication Sig   acetaminophen (TYLENOL) 650 MG CR tablet Take 650 mg by mouth every 8 (eight) hours as needed for pain (arthritis).   ALPRAZolam (XANAX) 1 MG tablet Take 1 tablet by mouth twice daily as needed for anxiety   Ascorbic Acid (VITAMIN C) 500 MG CAPS Take 500 mg by mouth every other day.   Calcium Citrate-Vitamin D (CALCIUM CITRATE +D PO) Take 1 tablet by mouth daily.   cholecalciferol (VITAMIN D3) 25 MCG (1000 UNIT) tablet Take 1,000 Units by mouth daily.   citalopram (CELEXA) 20 MG tablet Take 1 tablet by mouth once daily   Cyanocobalamin (VITAMIN B 12 PO) Take 100 mcg by mouth daily.   esomeprazole (NEXIUM) 40 MG capsule Take 1 capsule (40 mg total) by mouth daily at 12 noon.   fluticasone (FLONASE) 50 MCG/ACT nasal spray Place 1 spray into both nostrils 2 (two) times daily. (Patient taking differently: Place 1 spray into both nostrils 2 (two) times daily as needed.)   magnesium hydroxide (MILK OF MAGNESIA) 400 MG/5ML suspension Take 5 mLs by mouth daily as needed for mild constipation.   meloxicam (MOBIC) 7.5 MG tablet Take 1 tablet by mouth once daily   metoprolol tartrate (LOPRESSOR) 25 MG tablet Take 1 tablet by mouth twice daily (Patient taking differently: Take 12.5 mg by mouth 2 (two) times daily.)   Multiple Vitamin (MULTIVITAMIN) tablet Take 1 tablet by mouth daily.   POTASSIUM GLUCONATE PO Take 2 tablets by mouth.   zinc  gluconate 50 MG tablet Take 50 mg by mouth daily.   No facility-administered encounter medications on file as of 10/31/2022.  Fill History:   Dispensed Days Supply Quantity Provider Pharmacy  ALPRAZOLAM  1 MG TABS 10/15/2022 30       Dispensed Days Supply Quantity Provider Pharmacy  CITALOPRAM '20MG'$      TAB 10/07/2022 90       Dispensed Days Supply Quantity Provider Pharmacy  FLUTICASONE 50MCG   SPR 12/28/2021 30       Dispensed Days Supply Quantity Provider Pharmacy  MELOXICAM 7.'5MG'$      TAB 10/07/2022 90       Dispensed Days Supply Quantity Provider Pharmacy  METOPROLOL TARTRATE  25 MG TABS 10/05/2022 90      Reviewed chart prior to disease state call. Spoke with patient regarding BP  Recent Office Vitals: BP Readings from Last 3 Encounters:  09/30/22 120/60  04/29/22 118/70  04/16/22 116/64   Pulse Readings from Last 3 Encounters:  09/30/22 66  04/16/22 (!) 59  01/23/22 67    Wt Readings from Last 3 Encounters:  09/30/22 145 lb 3.2 oz (65.9 kg)  04/16/22 145 lb 9.6 oz (66 kg)  01/23/22 142 lb (64.4 kg)     Kidney Function Lab Results  Component Value Date/Time   CREATININE 0.61 09/30/2022 11:32 AM   CREATININE 0.54 09/24/2021 10:33 AM   CREATININE 0.50 (L) 09/20/2020 11:16 AM   GFR 81.94 09/30/2022 11:32 AM  GFRNONAA 90 09/20/2020 11:16 AM   GFRAA 104 09/20/2020 11:16 AM       Latest Ref Rng & Units 09/30/2022   11:32 AM 09/24/2021   10:33 AM 09/20/2020   11:16 AM  BMP  Glucose 70 - 99 mg/dL 98  94  91   BUN 6 - 23 mg/dL '11  13  10   '$ Creatinine 0.40 - 1.20 mg/dL 0.61  0.54  0.50   BUN/Creat Ratio 6 - 22 (calc)   20   Sodium 135 - 145 mEq/L 138  138  139   Potassium 3.5 - 5.1 mEq/L 3.7  4.1  4.8   Chloride 96 - 112 mEq/L 104  103  105   CO2 19 - 32 mEq/L '28  29  28   '$ Calcium 8.4 - 10.5 mg/dL 9.5  9.4  9.5    SCHED FU FEB Current antihypertensive regimen:  Metoprolol 25 mg twice daily  How often are you checking your Blood Pressure? {CHL HP BP  Monitoring Frequency:[phone]}  Current home BP readings: ***  What recent interventions/DTPs have been made by any provider to improve Blood Pressure control since last CPP Visit: No recent interventions.  Any recent hospitalizations or ED visits since last visit with CPP? No recent hospital visits.   What diet changes have been made to improve Blood Pressure Control?  Patient is not following any specific diet.  Breakfast - patient will have and egg with cheese  Lunch - patient doesn't eat lunch Dinner - patient will have a meat vegetable and starch  What exercise is being done to improve your Blood Pressure Control?  Patient goes to the [facility] twice weekly, walks and does all her house work.   Adherence Review: Is the patient currently on ACE/ARB medication? No Does the patient have >5 day gap between last estimated fill dates? No   Care Gaps: AWV - scheduled 01/26/2023 Last BP - 120/60 on 09/30/2022 Shingrix - never done Covid - overdue HGA1C - postponed Urine ACR - postponed  Star Rating Drugs: None  [place] Pharmacist Assistant [phone]

## 2022-11-26 DIAGNOSIS — Z6824 Body mass index (BMI) 24.0-24.9, adult: Secondary | ICD-10-CM | POA: Diagnosis not present

## 2022-12-30 ENCOUNTER — Telehealth: Payer: Self-pay | Admitting: Adult Health

## 2022-12-30 ENCOUNTER — Inpatient Hospital Stay: Admission: RE | Admit: 2022-12-30 | Payer: Medicare Other | Source: Ambulatory Visit

## 2022-12-30 NOTE — Telephone Encounter (Signed)
Patient requesting dexa scan for today be rescheduled

## 2022-12-30 NOTE — Telephone Encounter (Signed)
Spoke to pt and advised that this was not scheduled at our office. Pt advised that I see the appt. Was cancel prob due to no show. Advise pt to see if they will reach out to her to reschedule.

## 2023-01-05 ENCOUNTER — Other Ambulatory Visit: Payer: Self-pay | Admitting: Adult Health

## 2023-01-05 DIAGNOSIS — I471 Supraventricular tachycardia, unspecified: Secondary | ICD-10-CM

## 2023-01-13 ENCOUNTER — Other Ambulatory Visit: Payer: Self-pay | Admitting: Adult Health

## 2023-01-13 DIAGNOSIS — F411 Generalized anxiety disorder: Secondary | ICD-10-CM

## 2023-01-14 NOTE — Telephone Encounter (Signed)
Okay for refill?  

## 2023-02-10 NOTE — Progress Notes (Signed)
Care Management & Coordination Services Pharmacy Note  02/10/2023 Name:  [name] MRN:  [id number] DOB:  May 08, 1936  Summary: -Pt BP well-controlled and with no health concerns today. Has updated DEXA scan sch for 03/07/23.  Recommendations/Changes made from today's visit: -Continue medication therapy -Continue twice weekly home BP checks -[facility] -Reminder: DEXA scan 03/17/23  Follow up plan: -HTN call in 6 months -Pharmacist visit in 1 year   Subjective: [name] is an 87 y.o. year old female who is a primary patient of Dorothyann Peng, NP.  The care coordination team was consulted for assistance with disease management and care coordination needs.    Engaged with patient by telephone for follow up visit.  Recent office visits: None  Recent consult visits: None  Hospital visits: None   Objective:  Lab Results  Component Value Date   CREATININE 0.61 09/30/2022   BUN 11 09/30/2022   GFR 81.94 09/30/2022   GFRNONAA 90 09/20/2020   GFRAA 104 09/20/2020   NA 138 09/30/2022   K 3.7 09/30/2022   CALCIUM 9.5 09/30/2022   CO2 28 09/30/2022   GLUCOSE 98 09/30/2022    Lab Results  Component Value Date/Time   HGBA1C 5.6 09/09/2018 08:25 AM   HGBA1C 5.8 08/25/2017 10:20 AM   HGBA1C 6.0 08/13/2016 10:18 AM   GFR 81.94 09/30/2022 11:32 AM   GFR 84.99 09/24/2021 10:33 AM   MICROALBUR 0.7 08/25/2017 10:20 AM   MICROALBUR 0.8 08/13/2016 10:18 AM    Last diabetic Eye exam: No results found for: "HMDIABEYEEXA"  Last diabetic Foot exam: No results found for: "HMDIABFOOTEX"   Lab Results  Component Value Date   CHOL 161 09/30/2022   HDL 63.00 09/30/2022   LDLCALC 86 09/30/2022   LDLDIRECT 78.4 04/18/2010   TRIG 63.0 09/30/2022   CHOLHDL 3 09/30/2022       Latest Ref Rng & Units 09/30/2022   11:32 AM 09/24/2021   10:33 AM 09/20/2020   11:16 AM  Hepatic Function  Total Protein 6.0 - 8.3 g/dL 7.1  6.7  6.8   Albumin 3.5  - 5.2 g/dL 4.0  3.9    AST 0 - 37 U/L 18  15  17   $ ALT 0 - 35 U/L 16  16  20   $ Alk Phosphatase 39 - 117 U/L 113  121    Total Bilirubin 0.2 - 1.2 mg/dL 0.8  0.7  0.6     Lab Results  Component Value Date/Time   TSH 1.23 09/30/2022 11:32 AM   TSH 1.49 09/24/2021 10:33 AM   FREET4 0.88 03/21/2015 02:43 PM       Latest Ref Rng & Units 09/30/2022   11:32 AM 09/24/2021   10:33 AM 09/20/2020   11:16 AM  CBC  WBC 4.0 - 10.5 K/uL 4.5  4.2  5.1   Hemoglobin 12.0 - 15.0 g/dL 12.3  12.2  12.7   Hematocrit 36.0 - 46.0 % 37.2  36.9  38.5   Platelets 150.0 - 400.0 K/uL 231.0  242.0  252     Lab Results  Component Value Date/Time   VD25OH 49 04/18/2010 09:25 PM   VITAMINB12 943 (H) 09/09/2018 08:59 AM   VITAMINB12 >1500 (H) 05/01/2011 11:26 AM    Clinical ASCVD: No  The ASCVD Risk score (Arnett DK, et al., 2019) failed to calculate for the following reasons:   The 2019 ASCVD risk score is only valid for ages 13 to 1  01/23/2022    1:14 PM 01/09/2021   10:40 AM 09/20/2020   11:12 AM  Depression screen PHQ 2/9  Decreased Interest 0 0 0  Down, Depressed, Hopeless 0 0 0  PHQ - 2 Score 0 0 0  Altered sleeping  0   Tired, decreased energy  0   Change in appetite  0   Feeling bad or failure about yourself   0   Trouble concentrating  0   Moving slowly or fidgety/restless  0   Suicidal thoughts  0   PHQ-9 Score  0   Difficult doing work/chores  Not difficult at all      Social History   Tobacco Use  Smoking Status Never  Smokeless Tobacco Never   BP Readings from Last 3 Encounters:  09/30/22 120/60  04/29/22 118/70  04/16/22 116/64   Pulse Readings from Last 3 Encounters:  09/30/22 66  04/16/22 (!) 59  01/23/22 67   Wt Readings from Last 3 Encounters:  09/30/22 145 lb 3.2 oz (65.9 kg)  04/16/22 145 lb 9.6 oz (66 kg)  01/23/22 142 lb (64.4 kg)   BMI Readings from Last 3 Encounters:  09/30/22 24.16 kg/m  04/16/22 23.50 kg/m  01/23/22 23.63 kg/m     Allergies  Allergen Reactions   Celebrex [Celecoxib] Rash    REACTION: rash    Medications Reviewed Today     Reviewed by Anderson Malta, CMA (Certified Medical Assistant) on 09/30/22 at [facility] Status: <None>   Medication Order Taking? Sig Documenting Provider Last Dose Status Informant  acetaminophen (TYLENOL) 650 MG CR tablet [id number] Yes Take 650 mg by mouth every 8 (eight) hours as needed for pain (arthritis). [provider] Taking Active   ALPRAZolam Duanne Moron) 1 MG tablet [id number] Yes Take 1 tablet by mouth twice daily as needed for anxiety Nafziger, Tommi Rumps, NP Taking Active   Ascorbic Acid (VITAMIN C) 500 MG CAPS [id number] Yes Take 500 mg by mouth every other day. [provider] Taking Active   Calcium Citrate-Vitamin D (CALCIUM CITRATE +D PO) [id number] Yes Take 1 tablet by mouth daily. [provider] Taking Active   cholecalciferol (VITAMIN D3) 25 MCG (1000 UNIT) tablet [id number] Yes Take 1,000 Units by mouth daily. [provider] Taking Active   citalopram (CELEXA) 20 MG tablet [id number] Yes Take 1 tablet (20 mg total) by mouth daily. Nafziger, Tommi Rumps, NP Taking Active   Cyanocobalamin (VITAMIN B 12 PO) [id number] Yes Take 100 mcg by mouth daily. [provider] Taking Active   esomeprazole (NEXIUM) 40 MG capsule [id number] Yes Take 1 capsule (40 mg total) by mouth daily at 12 noon. Marletta Lor, MD Taking Active   fluticasone [facility]) 50 MCG/ACT nasal spray [id number] Yes Place 1 spray into both nostrils 2 (two) times daily.  Patient taking differently: Place 1 spray into both nostrils 2 (two) times daily as needed.   [employer], [place] Taking Active   magnesium hydroxide (MILK OF MAGNESIA) 400 MG/5ML suspension [id number] Yes Take 5 mLs by mouth daily as needed for mild constipation. [provider] Taking Active   meloxicam (MOBIC) 7.5 MG tablet [id number] Yes Take 1 tablet (7.5 mg total) by mouth  daily. Nafziger, Tommi Rumps, NP Taking Active   metoprolol tartrate (LOPRESSOR) 25 MG tablet [id number] Yes Take 1 tablet by mouth twice daily  Patient taking differently: Take 12.5 mg by mouth 2 (two) times daily.   Josue Hector, MD Taking Active   Multiple Vitamin (MULTIVITAMIN)  tablet [id number] Yes Take 1 tablet by mouth daily. [provider] Taking Active Self  POTASSIUM GLUCONATE PO [id number] Yes Take 2 tablets by mouth. [provider] Taking Active   zinc gluconate 50 MG tablet [id number] Yes Take 50 mg by mouth daily. [provider] Taking Active             SDOH:  (Social Determinants of Health) assessments and interventions performed: Yes SDOH Interventions    Flowsheet Row Clinical Support from 01/09/2021 in [facility] [place] Management from 10/17/2020 in [facility] [place] Interventions Intervention Not Indicated --  Housing Interventions Intervention Not Indicated --  Transportation Interventions Intervention Not Indicated Intervention Not Indicated  Depression Interventions/Treatment  PHQ2-9 Score <4 Follow-up Not Indicated --  Financial Strain Interventions Intervention Not Indicated Intervention Not Indicated  Physical Activity Interventions Intervention Not Indicated --  Stress Interventions Intervention Not Indicated --  Social Connections Interventions Intervention Not Indicated --       Medication Assistance: None required.  Patient affirms current coverage meets needs.  Medication Access: Within the past 30 days, how often has patient missed a dose of medication? None Is a pillbox or other method used to improve adherence? Yes  Factors that may affect medication adherence? no barriers identified Are meds synced by current pharmacy? No  Are meds delivered by current pharmacy? No  Does patient experience delays in picking up  medications due to transportation concerns? No   [facility] Reviewed: Is patient disadvantaged to use [facility]?: Yes  Current Rx insurance plan: [place] Name and location of Current pharmacy:  [facility] [place] [facility] [address] [place] [address] Phone: [phone] Fax: [phone]  [facility] services reviewed with patient today?: No  Patient requests to transfer care to [facility]?: No  Reason patient declined to change pharmacies: Disadvantaged due to insurance/mail order  Compliance/Adherence/Medication fill history: Care Gaps: A1C Vaccine: Shingles, COVID  Star-Rating Drugs: None   Assessment/Plan   Hypertension (BP goal <130/80) -Controlled -Current treatment: Metoprolol tartrate 71m 1/2 tab BID Appropriate, Effective, Safe, Accessible -Medications previously tried: None  -Current home readings: 2x/week, no log but reports WNL -Current dietary habits: watching salt -Current exercise habits: go -Denies hypotensive/hypertensive symptoms -Educated on BP goals and benefits of medications for prevention of heart attack, stroke and kidney damage; Daily salt intake goal < 2300 mg; Exercise goal of 150 minutes per week; Importance of home blood pressure monitoring; Proper BP monitoring technique; -Counseled to monitor BP at home 2x/week, document, and provide log at future appointments -Counseled on diet and exercise extensively Recommended to continue current medication  Hyperlipidemia: (LDL goal < 100) -Controlled -Current treatment: None -Medications previously tried: None  -Current dietary patterns: see above -Current exercise habits: see above -Educated on Cholesterol goals;  Importance of limiting foods high in cholesterol; Exercise goal of 150 minutes per week; -Counseled on diet and exercise extensively  Osteoporosis / Osteopenia (Goal Prevent bone loss and  fracture) -Uncontrolled -Last DEXA Scan: 05/16/15   T-Score femoral neck: -1.5  10-year probability of major osteoporotic fracture: 5.7%  10-year probability of hip fracture: 1.3% -Patient is not a candidate for pharmacologic treatment -Current treatment  Calcium + Vit D OTC Appropriate, Effective, Safe, Accessible -Medications previously tried: None  -Recommend [phone] units of vitamin D daily. Recommend 1200 mg of calcium daily from dietary and supplemental sources.  Recommend weight-bearing and muscle strengthening exercises for building and maintaining bone density. -Recommended to continue current medication -Bone density scan 03/17/2023  [place] Pharmacist [phone]

## 2023-02-11 ENCOUNTER — Ambulatory Visit (INDEPENDENT_AMBULATORY_CARE_PROVIDER_SITE_OTHER): Payer: Medicare Other

## 2023-02-11 VITALS — BP 120/60 | HR 62 | Temp 97.5°F | Ht 65.5 in | Wt 148.8 lb

## 2023-02-11 DIAGNOSIS — Z0000 Encounter for general adult medical examination without abnormal findings: Secondary | ICD-10-CM | POA: Diagnosis not present

## 2023-02-11 NOTE — Patient Instructions (Addendum)
[name] , Thank you for taking time to come for your Medicare Wellness Visit. I appreciate your ongoing commitment to your health goals. Please review the following plan we discussed and let me know if I can assist you in the future.   These are the goals we discussed:  Goals       Exercise 3x per week (30 min per time)      Continue to walk 2x weekly for 30 min.      No Current Goals (pt-stated)      Stay Alive.        This is a list of the screening recommended for you and due dates:  Health Maintenance  Topic Date Due   Hemoglobin A1C  03/10/2019   COVID-19 Vaccine (4 - 2023-24 season) 02/27/2023*   Zoster (Shingles) Vaccine (1 of 2) 05/12/2023*   Yearly kidney health urinalysis for diabetes  10/01/2027*   Yearly kidney function blood test for diabetes  10/01/2023   Medicare Annual Wellness Visit  02/12/2024   DTaP/Tdap/Td vaccine (2 - Td or Tdap) 08/13/2024   Pneumonia Vaccine  Completed   Flu Shot  Completed   DEXA scan (bone density measurement)  Completed   HPV Vaccine  Aged Out   Complete foot exam   Discontinued   Eye exam for diabetics  Discontinued  *Topic was postponed. The date shown is not the original due date.    Advanced directives: .[place]  Conditions/risks identified: None  Next appointment: Follow up in one year for your annual wellness visit    Preventive Care 65 Years and Older, Female Preventive care refers to lifestyle choices and visits with your health care provider that can promote health and wellness. What does preventive care include? A yearly physical exam. This is also called an annual well check. Dental exams once or twice a year. Routine eye exams. Ask your health care provider how often you should have your eyes checked. Personal lifestyle choices, including: Daily care of your teeth and gums. Regular physical activity. Eating a healthy diet. Avoiding tobacco and drug use. Limiting alcohol use. Practicing safe sex. Taking low-dose  aspirin every day. Taking vitamin and mineral supplements as recommended by your health care provider. What happens during an annual well check? The services and screenings done by your health care provider during your annual well check will depend on your age, overall health, lifestyle risk factors, and family history of disease. Counseling  Your health care provider may ask you questions about your: Alcohol use. Tobacco use. Drug use. Emotional well-being. Home and relationship well-being. Sexual activity. Eating habits. History of falls. Memory and ability to understand (cognition). Work and work Statistician. Reproductive health. Screening  You may have the following tests or measurements: Height, weight, and BMI. Blood pressure. Lipid and cholesterol levels. These may be checked every 5 years, or more frequently if you are over 43 years old. Skin check. Lung cancer screening. You may have this screening every year starting at age 54 if you have a 30-pack-year history of smoking and currently smoke or have quit within the past 15 years. Fecal occult blood test (FOBT) of the stool. You may have this test every year starting at age 18. Flexible sigmoidoscopy or colonoscopy. You may have a sigmoidoscopy every 5 years or a colonoscopy every 10 years starting at age 62. Hepatitis C blood test. Hepatitis B blood test. Sexually transmitted disease (STD) testing. Diabetes screening. This is done by checking your blood sugar (glucose) after  you have not eaten for a while (fasting). You may have this done every 1-3 years. Bone density scan. This is done to screen for osteoporosis. You may have this done starting at age 70. Mammogram. This may be done every 1-2 years. Talk to your health care provider about how often you should have regular mammograms. Talk with your health care provider about your test results, treatment options, and if necessary, the need for more tests. Vaccines  Your  health care provider may recommend certain vaccines, such as: Influenza vaccine. This is recommended every year. Tetanus, diphtheria, and acellular pertussis (Tdap, Td) vaccine. You may need a Td booster every 10 years. Zoster vaccine. You may need this after age 90. Pneumococcal 13-valent conjugate (PCV13) vaccine. One dose is recommended after age 53. Pneumococcal polysaccharide (PPSV23) vaccine. One dose is recommended after age 49. Talk to your health care provider about which screenings and vaccines you need and how often you need them. This information is not intended to replace advice given to you by your health care provider. Make sure you discuss any questions you have with your health care provider. Document Released: 01/11/2016 Document Revised: 09/03/2016 Document Reviewed: 10/16/2015 Elsevier Interactive Patient Education  2017 [place] Prevention in the Home Falls can cause injuries. They can happen to people of all ages. There are many things you can do to make your home safe and to help prevent falls. What can I do on the outside of my home? Regularly fix the edges of walkways and driveways and fix any cracks. Remove anything that might make you trip as you walk through a door, such as a raised step or threshold. Trim any bushes or trees on the path to your home. Use bright outdoor lighting. Clear any walking paths of anything that might make someone trip, such as rocks or tools. Regularly check to see if handrails are loose or broken. Make sure that both sides of any steps have handrails. Any raised decks and porches should have guardrails on the edges. Have any leaves, snow, or ice cleared regularly. Use sand or salt on walking paths during winter. Clean up any spills in your garage right away. This includes oil or grease spills. What can I do in the bathroom? Use night lights. Install grab bars by the toilet and in the tub and shower. Do not use towel bars as  grab bars. Use non-skid mats or decals in the tub or shower. If you need to sit down in the shower, use a plastic, non-slip stool. Keep the floor dry. Clean up any water that spills on the floor as soon as it happens. Remove soap buildup in the tub or shower regularly. Attach bath mats securely with double-sided non-slip rug tape. Do not have throw rugs and other things on the floor that can make you trip. What can I do in the bedroom? Use night lights. Make sure that you have a light by your bed that is easy to reach. Do not use any sheets or blankets that are too big for your bed. They should not hang down onto the floor. Have a firm chair that has side arms. You can use this for support while you get dressed. Do not have throw rugs and other things on the floor that can make you trip. What can I do in the kitchen? Clean up any spills right away. Avoid walking on wet floors. Keep items that you use a lot in easy-to-reach places. If you  need to reach something above you, use a strong step stool that has a grab bar. Keep electrical cords out of the way. Do not use floor polish or wax that makes floors slippery. If you must use wax, use non-skid floor wax. Do not have throw rugs and other things on the floor that can make you trip. What can I do with my stairs? Do not leave any items on the stairs. Make sure that there are handrails on both sides of the stairs and use them. Fix handrails that are broken or loose. Make sure that handrails are as long as the stairways. Check any carpeting to make sure that it is firmly attached to the stairs. Fix any carpet that is loose or worn. Avoid having throw rugs at the top or bottom of the stairs. If you do have throw rugs, attach them to the floor with carpet tape. Make sure that you have a light switch at the top of the stairs and the bottom of the stairs. If you do not have them, ask someone to add them for you. What else can I do to help prevent  falls? Wear shoes that: Do not have high heels. Have rubber bottoms. Are comfortable and fit you well. Are closed at the toe. Do not wear sandals. If you use a stepladder: Make sure that it is fully opened. Do not climb a closed stepladder. Make sure that both sides of the stepladder are locked into place. Ask someone to hold it for you, if possible. Clearly mark and make sure that you can see: Any grab bars or handrails. First and last steps. Where the edge of each step is. Use tools that help you move around (mobility aids) if they are needed. These include: Canes. Walkers. Scooters. Crutches. Turn on the lights when you go into a dark area. Replace any light bulbs as soon as they burn out. Set up your furniture so you have a clear path. Avoid moving your furniture around. If any of your floors are uneven, fix them. If there are any pets around you, be aware of where they are. Review your medicines with your doctor. Some medicines can make you feel dizzy. This can increase your chance of falling. Ask your doctor what other things that you can do to help prevent falls. This information is not intended to replace advice given to you by your health care provider. Make sure you discuss any questions you have with your health care provider. Document Released: 10/11/2009 Document Revised: 05/22/2016 Document Reviewed: 01/19/2015 Elsevier Interactive Patient Education  2017 [employer].

## 2023-02-11 NOTE — Progress Notes (Signed)
Subjective:   [name] is a 87 y.o. female who presents for Medicare Annual (Subsequent) preventive examination.  Review of Systems      Cardiac Risk Factors include: advanced age (>51mn, >>37women);hypertension     Objective:    Today's Vitals   02/11/23 1113  BP: 120/60  Pulse: 62  Temp: (!) 97.5 F (36.4 C)  TempSrc: Oral  SpO2: 97%  Weight: 148 lb 12.8 oz (67.5 kg)  Height: 5' 5.5" (1.664 m)   Body mass index is 24.39 kg/m.     02/11/2023   11:35 AM 01/23/2022    1:21 PM 06/01/2021   12:44 PM 01/09/2021   10:36 AM 08/06/2015   11:38 AM 05/26/2015    8:23 PM  Advanced Directives  Does Patient Have a Medical Advance Directive? Yes Yes No Yes Yes No  Type of [place]aramed[place]of [place]ake[place] will [place]. [place] will  [place] will [place] will   Does patient want to make changes to medical advance directive?  No - Patient declined  No - Patient declined    Copy of HG[place]in Chart? No - copy requested No - copy requested  No - copy requested    Would patient like information on creating a medical advance directive?   No - Patient declined   No - patient declined information    Current Medications (verified) Outpatient Encounter Medications as of 02/11/2023  Medication Sig   acetaminophen (TYLENOL) 650 MG CR tablet Take 650 mg by mouth every 8 (eight) hours as needed for pain (arthritis).   ALPRAZolam (XANAX) 1 MG tablet Take 1 tablet by mouth twice daily as needed for anxiety   Ascorbic Acid (VITAMIN C) 500 MG CAPS Take 500 mg by mouth every other day.   Calcium Citrate-Vitamin D (CALCIUM CITRATE +D PO) Take 1 tablet by mouth daily.   cholecalciferol (VITAMIN D3) 25 MCG (1000 UNIT) tablet Take 1,000 Units by mouth daily.   citalopram (CELEXA) 20 MG tablet Take 1 tablet by mouth once daily   Cyanocobalamin (VITAMIN B 12 PO) Take 100 mcg by mouth  daily.   esomeprazole (NEXIUM) 40 MG capsule Take 1 capsule (40 mg total) by mouth daily at 12 noon.   fluticasone (FLONASE) 50 MCG/ACT nasal spray Place 1 spray into both nostrils 2 (two) times daily. (Patient taking differently: Place 1 spray into both nostrils 2 (two) times daily as needed.)   magnesium hydroxide (MILK OF MAGNESIA) 400 MG/5ML suspension Take 5 mLs by mouth daily as needed for mild constipation.   meloxicam (MOBIC) 7.5 MG tablet Take 1 tablet by mouth once daily   metoprolol tartrate (LOPRESSOR) 25 MG tablet Take 1 tablet by mouth twice daily (Patient taking differently: Take 12.5 mg by mouth 2 (two) times daily.)   Multiple Vitamin (MULTIVITAMIN) tablet Take 1 tablet by mouth daily.   POTASSIUM GLUCONATE PO Take 2 tablets by mouth.   zinc gluconate 50 MG tablet Take 50 mg by mouth daily.   No facility-administered encounter medications on file as of 02/11/2023.    Allergies (verified) Celebrex [celecoxib]   History: Past Medical History:  Diagnosis Date   Anxiety state    unspecified   ARTHRITIS 09/03/2010   Asthma    CHOLELITHIASIS 09/03/2010   COLONIC POLYPS, HX OF 06/02/2008   DIVERTICULAR DISEASE 09/03/2010   DYSPHAGIA UNSPECIFIED 09/03/2010   ESSENTIAL HYPERTENSION 10/29/2010   Fibromyalgia    GALLSTONE  PANCREATITIS 09/03/2010   GERD 09/02/2007   HEART MURMUR, HX OF 08/12/2010   Hematuria    Hiatal hernia    Hx of colonoscopy    2014   HYPERLIPIDEMIA [id number]   Insomnia    Lichen simplex chronicus    Low back pain    chronic   Osteopenia    OSTEOPOROSIS 05/17/2009   Palpitations 09/03/2010   event monitor reveals long RP tachycardia, likely sinus tachycardia   Paresthesia    Polyuria    RBBB 09/05/2010   VENTRICULAR HYPERTROPHY, LEFT 08/12/2010   VITAMIN B12 DEFICIENCY 09/02/2007   WEIGHT LOSS, ABNORMAL 06/02/2008   Past Surgical History:  Procedure Laterality Date   ABDOMINAL HYSTERECTOMY     THA and O   APPENDECTOMY     CHOLECYSTECTOMY      ESOPHAGOGASTRODUODENOSCOPY     with HH and esophagitis   OOPHORECTOMY     SPINE SURGERY     cervical fusion   Family History  Problem Relation Age of Onset   Heart disease Mother    Stroke Mother    Prostate cancer Father    Social History   Socioeconomic History   Marital status: Widowed    Spouse name: Not on file   Number of children: 6   Years of education: Not on file   Highest education level: Not on file  Occupational History   Occupation: retired  Tobacco Use   Smoking status: Never   Smokeless tobacco: Never  Vaping Use   Vaping Use: Never used  Substance and Sexual Activity   Alcohol use: No    Alcohol/week: 0.0 standard drinks of alcohol   Drug use: No   Sexual activity: Never  Other Topics Concern   Not on file  Social History Narrative   Widowed   2 children   Lives in [place]   Retired from [employer] Strain: [employer]  (02/11/2023)   Overall Financial Resource Strain (CARDIA)    Difficulty of Paying Living Expenses: Not hard at all  Food Insecurity: No [place] (02/11/2023)   Hunger Vital Sign    Worried About Running Out of Food in the Last Year: Never true    [place] in the Last Year: Never true  Transportation Needs: No Transportation Needs (02/11/2023)   PRAPARE - Hydrologist (Medical): No    Lack of Transportation (Non-Medical): No  Physical Activity: Sufficiently Active (02/11/2023)   Exercise Vital Sign    Days of Exercise per Week: 2 days    Minutes of Exercise per Session: 90 min  Stress: No Stress Concern Present (02/11/2023)   [place]    Feeling of Stress : Not at all  Social Connections: Moderately Integrated (02/11/2023)   Social Connection and Isolation Panel [NHANES]    Frequency of Communication with Friends and Family: More than three times a week    Frequency of  Social Gatherings with Friends and Family: More than three times a week    Attends Religious Services: More than 4 times per year    Active Member of [employer] or Organizations: Yes    Attends Archivist Meetings: More than 4 times per year    Marital Status: Widowed    Tobacco Counseling Counseling given: Not Answered   Clinical Intake:  Pre-visit preparation completed: Yes  Pain : No/denies pain  BMI - recorded: 24.24 Nutritional Status: BMI of 19-24  Normal Nutritional Risks: None Diabetes: No  How often do you need to have someone help you when you read instructions, pamphlets, or other written materials from your doctor or pharmacy?: 1 - Never  Diabetic?  No  Interpreter Needed?: No  Information entered by :: Rolene Arbour LPN   Activities of Daily Living    02/11/2023   11:33 AM  In your present state of health, do you have any difficulty performing the following activities:  Hearing? 0  Vision? 0  Difficulty concentrating or making decisions? 0  Walking or climbing stairs? 0  Dressing or bathing? 0  Doing errands, shopping? 0  Preparing Food and eating ? N  Using the Toilet? N  In the past six months, have you accidently leaked urine? N  Do you have problems with loss of bowel control? N  Managing your Medications? N  Managing your Finances? N  Housekeeping or managing your Housekeeping? N    Patient Care Team: Dorothyann Peng, NP as PCP - General (Family Medicine) Josue Hector, MD as PCP - Cardiology (Cardiology) Viona Gilmore, [facility] (Inactive) as Pharmacist (Pharmacist)  Indicate any recent Medical Services you may have received from other than [facility] providers in the past year (date may be approximate).     Assessment:   This is a routine wellness examination for [place].  Hearing/Vision screen Hearing Screening - Comments:: Denies hearing difficulties   Vision Screening - Comments:: Wears rx glasses - up to date with routine eye  exams with  Dr Gershon Crane  Dietary issues and exercise activities discussed: Exercise limited by: None identified   Goals Addressed               This Visit's Progress     No Current Goals (pt-stated)        Stay Alive.       Depression Screen    02/11/2023   11:12 AM 01/23/2022    1:14 PM 01/09/2021   10:40 AM 09/20/2020   11:12 AM 09/20/2019   10:43 AM 09/09/2018    8:11 AM 08/13/2016    9:17 AM  PHQ 2/9 Scores  PHQ - 2 Score 0 0 0 0 2 6 3  $ PHQ- 9 Score   0  2 7 10    $ Fall Risk    02/11/2023   11:34 AM 01/23/2022    1:16 PM 01/09/2021   10:38 AM 09/20/2020   11:12 AM 09/20/2019   10:43 AM  Fall Risk   Falls in the past year? 1 0 1 0 0  Number falls in past yr: 0 0 0    Injury with Fall? 0 0 0    Comment No injury or Medical attention needed      Risk for fall due to : No Fall Risks  Impaired balance/gait    Follow up Falls prevention discussed  Falls evaluation completed;Falls prevention discussed  Falls evaluation completed    FALL RISK PREVENTION PERTAINING TO THE HOME:  Any stairs in or around the home? No  If so, are there any without handrails? No  Home free of loose throw rugs in walkways, pet beds, electrical cords, etc? Yes  Adequate lighting in your home to reduce risk of falls? Yes   ASSISTIVE DEVICES UTILIZED TO PREVENT FALLS:  Life alert? No  Use of a cane, walker or w/c? No  Grab bars in the bathroom? No  Shower chair or bench in  shower? No  Elevated toilet seat or a handicapped toilet? No   TIMED UP AND GO:  Was the test performed? Yes .  Length of time to ambulate 10 feet: 10 sec.   Gait steady and fast without use of assistive device  Cognitive Function:        02/11/2023   11:36 AM 01/23/2022    1:18 PM  6CIT Screen  What Year? 0 points 0 points  What month? 0 points 0 points  What time? 0 points 0 points  Count back from 20 2 points 0 points  Months in reverse 2 points 2 points  Repeat phrase 10 points 2 points  Total Score 14  points 4 points    Immunizations Immunization History  Administered Date(s) Administered   Fluad Quad(high Dose 65+) 09/20/2019, 09/20/2020, 09/30/2022   Influenza Split 10/01/2011, 10/11/2012   Influenza Whole 09/30/2008, 09/27/2009, 09/25/2010   Influenza, High Dose Seasonal PF 09/27/2015, 09/12/2016, 09/24/2017, 09/09/2018   Influenza,inj,Quad PF,6+ Mos 09/14/2013, 09/27/2014, 10/16/2014, 09/24/2021   MODERNA COVID-19 SARS-COV-2 PEDS BIVALENT BOOSTER 6Y-11Y 11/14/2021   Moderna Sars-Covid-2 Vaccination 01/10/2020, 02/20/2020   Pneumococcal Conjugate-13 03/21/2015   Pneumococcal Polysaccharide-23 08/16/2014   Tdap 08/13/2014    TDAP status: Up to date  Flu Vaccine status: Up to date  Pneumococcal vaccine status: Up to date  Covid-19 vaccine status: Completed vaccines  Qualifies for Shingles Vaccine? Yes   Zostavax completed No   Shingrix Completed?: No.    Education has been provided regarding the importance of this vaccine. Patient has been advised to call insurance company to determine out of pocket expense if they have not yet received this vaccine. Advised may also receive vaccine at local pharmacy or Health Dept. Verbalized acceptance and understanding.  Screening Tests Health Maintenance  Topic Date Due   HEMOGLOBIN A1C  03/10/2019   COVID-19 Vaccine (4 - 2023-24 season) 02/27/2023 (Originally 08/29/2022)   Zoster Vaccines- Shingrix (1 of 2) 05/12/2023 (Originally [birth year]1988)   Diabetic kidney evaluation - Urine ACR  10/01/2027 (Originally 08/25/2018)   Diabetic kidney evaluation - eGFR measurement  10/01/2023   Medicare Annual Wellness (AWV)  02/12/2024   DTaP/Tdap/Td (2 - Td or Tdap) 08/13/2024   Pneumonia Vaccine 59+ Years old  Completed   INFLUENZA VACCINE  Completed   DEXA SCAN  Completed   HPV VACCINES  Aged Out   FOOT EXAM  Discontinued   OPHTHALMOLOGY EXAM  Discontinued    Health Maintenance  Health Maintenance Due  Topic Date Due   HEMOGLOBIN A1C   03/10/2019    Colorectal cancer screening: No longer required.   Mammogram status: No longer required due to Age.  Bone Density status: Ordered 07/09/22. Pt provided with contact info and advised to call to schedule appt.  Lung Cancer Screening: (Low Dose CT Chest recommended if Age 64-80 years, 30 pack-year currently smoking OR have quit w/in 15years.) does not qualify.     Additional Screening:  Hepatitis C Screening: does not qualify; Completed   Vision Screening: Recommended annual ophthalmology exams for early detection of glaucoma and other disorders of the eye. Is the patient up to date with their annual eye exam?  Yes  Who is the provider or what is the name of the office in which the patient attends annual eye exams? Dr Gershon Crane If pt is not established with a provider, would they like to be referred to a provider to establish care? No .   Dental Screening: Recommended annual dental exams for proper oral hygiene  Community Resource Referral / Chronic Care Management:  CRR required this visit?  No   CCM required this visit?  No      Plan:     I have personally reviewed and noted the following in the patient's chart:   Medical and social history Use of alcohol, tobacco or illicit drugs  Current medications and supplements including opioid prescriptions. Patient is not currently taking opioid prescriptions. Functional ability and status Nutritional status Physical activity Advanced directives List of other physicians Hospitalizations, surgeries, and ER visits in previous 12 months Vitals Screenings to include cognitive, depression, and falls Referrals and appointments  In addition, I have reviewed and discussed with patient certain preventive protocols, quality metrics, and best practice recommendations. A written personalized care plan for preventive services as well as general preventive health recommendations were provided to patient.     Criselda Peaches,  LPN   [id number]   Nurse Notes: Patient due Hemoglobin A1C

## 2023-02-13 ENCOUNTER — Telehealth: Payer: Self-pay

## 2023-02-13 NOTE — Progress Notes (Signed)
Care Management & Coordination Services Pharmacy Team  Reason for Encounter: Appointment Reminder  Contacted patient to confirm telephone appointment with Burman Riis, PharmD on 02/16/2023 at 11:45. Spoke with patient on 02/13/2023   Do you have any problems getting your medications? Patient denies  What is your top health concern you would like to discuss at your upcoming visit? No concerns  Have you seen any other providers since your last visit with PCP? Patient saw Dr. Arvella Nigh (GYN) on 11/26/2022  Care Gaps: AWV - completed 01/26/2023. Scheduled 02/22/2024 HGA1C - overdue Covid - postponed Shingrix - postponed Urine ACR - postponed   Star Rating Drugs: None  [facility] Pharmacist Assistant [phone]

## 2023-02-16 ENCOUNTER — Ambulatory Visit: Payer: Medicare Other

## 2023-04-06 ENCOUNTER — Other Ambulatory Visit: Payer: Self-pay | Admitting: Adult Health

## 2023-04-06 DIAGNOSIS — I471 Supraventricular tachycardia, unspecified: Secondary | ICD-10-CM

## 2023-04-20 ENCOUNTER — Other Ambulatory Visit: Payer: Self-pay | Admitting: Cardiovascular Disease

## 2023-04-20 ENCOUNTER — Other Ambulatory Visit: Payer: Self-pay | Admitting: Adult Health

## 2023-04-20 DIAGNOSIS — F411 Generalized anxiety disorder: Secondary | ICD-10-CM

## 2023-04-23 ENCOUNTER — Other Ambulatory Visit: Payer: Self-pay | Admitting: Adult Health

## 2023-04-23 DIAGNOSIS — F411 Generalized anxiety disorder: Secondary | ICD-10-CM

## 2023-04-23 NOTE — Telephone Encounter (Signed)
Pt called to FU on this refill request, stating she only has one pill left.  ALPRAZolam (XANAX) 1 MG tablet   Pt was advised to call the pharmacy to confirm receipt of Rx.

## 2023-04-24 NOTE — Telephone Encounter (Signed)
Okay for refill?  

## 2023-04-27 NOTE — Telephone Encounter (Signed)
Pt is calling and the med was printed not escribed on 04-21-2023

## 2023-04-29 ENCOUNTER — Other Ambulatory Visit: Payer: Self-pay | Admitting: Adult Health

## 2023-04-29 DIAGNOSIS — F411 Generalized anxiety disorder: Secondary | ICD-10-CM

## 2023-05-02 ENCOUNTER — Other Ambulatory Visit: Payer: Self-pay | Admitting: Adult Health

## 2023-05-02 ENCOUNTER — Ambulatory Visit
Admission: EM | Admit: 2023-05-02 | Discharge: 2023-05-02 | Disposition: A | Discharge status: Home / Self Care | Payer: Medicare Other | Attending: Family Medicine | Admitting: Family Medicine

## 2023-05-02 DIAGNOSIS — F411 Generalized anxiety disorder: Secondary | ICD-10-CM | POA: Diagnosis not present

## 2023-05-02 MED ORDER — ALPRAZOLAM 1 MG PO TABS: 1.0000 mg | ORAL_TABLET | Freq: Two times a day (BID) | ORAL | 0 refills | Status: DC | PRN

## 2023-05-02 NOTE — ED Provider Notes (Signed)
[facility]   [phone] 05/02/23 Arrival Time: 1145  ASSESSMENT & PLAN:  1. Anxiety state    Plans to visit her PCP office to arrange a prompt follow up and to discuss further Xanax refills. Small amount given today. In no distress.  Meds ordered this encounter  Medications   ALPRAZolam (XANAX) 1 MG tablet    Sig: Take 1 tablet (1 mg total) by mouth 2 (two) times daily as needed for anxiety.    Dispense:  15 tablet    Refill:  0   [place] Controlled Substances Registry consulted for this patient. I feel the risk/benefit ratio today is favorable for proceeding with this prescription for a controlled substance. Medication sedation precautions given.   Follow-up Information     Schedule an appointment as soon as possible for a visit  with Shirline Frees, NP.   Specialty: Family Medicine Why: To discuss a follow up visit for your Xanax. Contact information: [address] [place] [address] [phone]                Reviewed expectations re: course of current medical issues. Questions answered. Outlined signs and symptoms indicating need for more acute intervention. Patient verbalized understanding. After Visit Summary given.   SUBJECTIVE:  [name] is a 87 y.o. female who reports she needs a refill on  her Xanax script. Long-term use for anxiety. It appears medication might have been sent to her pharmacy in Epic but she reports that pharmacy has no record of this. Has been out for a couple of weeks at least. Having trouble sleeping. Daughter with her today reports no mental status changes.  Social History   Tobacco Use  Smoking Status Never  Smokeless Tobacco Never   Social History   Substance and Sexual Activity  Alcohol Use No   Alcohol/week: 0.0 standard drinks of alcohol    OBJECTIVE:  Vitals:   05/02/23 1325  BP: 137/63  Pulse: (!) 56  Resp: 18  Temp: 97.9 F (36.6 C)  TempSrc: Oral  SpO2: 94%    General  appearance: alert; appears NAD Eyes: PERRLA; EOMI; conjunctivae normal HENT: normocephalic; atraumatic Neck: supple Lungs: unlabored Heart: regular Skin: warm and dry Neurologic: normal gait; normal symmetric reflexes Psychological: alert and cooperative; appropriate mood; normal affect   Allergies  Allergen Reactions   Celebrex [Celecoxib] Rash    REACTION: rash    Past Medical History:  Diagnosis Date   Anxiety state    unspecified   ARTHRITIS 09/03/2010   Asthma    CHOLELITHIASIS 09/03/2010   COLONIC POLYPS, HX OF 06/02/2008   DIVERTICULAR DISEASE 09/03/2010   DYSPHAGIA UNSPECIFIED 09/03/2010   ESSENTIAL HYPERTENSION 10/29/2010   Fibromyalgia    GALLSTONE PANCREATITIS 09/03/2010   GERD 09/02/2007   HEART MURMUR, HX OF 08/12/2010   Hematuria    Hiatal hernia    Hx of colonoscopy    2014   HYPERLIPIDEMIA 12/09/2007   Insomnia    Lichen simplex chronicus    Low back pain    chronic   Osteopenia    OSTEOPOROSIS 05/17/2009   Palpitations 09/03/2010   event monitor reveals long RP tachycardia, likely sinus tachycardia   Paresthesia    Polyuria    RBBB 09/05/2010   VENTRICULAR HYPERTROPHY, LEFT 08/12/2010   VITAMIN B12 DEFICIENCY 09/02/2007   WEIGHT LOSS, ABNORMAL 06/02/2008   Social History   Socioeconomic History   Marital status: Widowed    Spouse name: Not on file   Number  of children: 6   Years of education: Not on file   Highest education level: Not on file  Occupational History   Occupation: retired  Tobacco Use   Smoking status: Never   Smokeless tobacco: Never  Vaping Use   Vaping Use: Never used  Substance and Sexual Activity   Alcohol use: No    Alcohol/week: 0.0 standard drinks of alcohol   Drug use: No   Sexual activity: Never  Other Topics Concern   Not on file  Social History Narrative   Widowed   2 children   Lives in [place]   Retired from [employer]   Social Determinants of [employer] Strain: Low Risk  (02/11/2023)   Overall  Financial Resource Strain (CARDIA)    Difficulty of Paying Living Expenses: Not hard at all  Food Insecurity: No Food Insecurity (02/11/2023)   Hunger Vital Sign    Worried About Running Out of Food in the Last Year: Never true    Ran Out of Food in the Last Year: Never true  Transportation Needs: No Transportation Needs (02/11/2023)   PRAPARE - Administrator, Civil Service (Medical): No    Lack of Transportation (Non-Medical): No  Physical Activity: Sufficiently Active (02/11/2023)   Exercise Vital Sign    Days of Exercise per Week: 2 days    Minutes of Exercise per Session: 90 min  Stress: No Stress Concern Present (02/11/2023)   [employer] of Occupational Health - Occupational Stress Questionnaire    Feeling of Stress : Not at all  Social Connections: Moderately Integrated (02/11/2023)   Social Connection and Isolation Panel [NHANES]    Frequency of Communication with Friends and Family: More than three times a week    Frequency of Social Gatherings with Friends and Family: More than three times a week    Attends Religious Services: More than 4 times per year    Active Member of [employer] or Organizations: Yes    Attends Banker Meetings: More than 4 times per year    Marital Status: Widowed  Intimate Partner Violence: Not At Risk (02/11/2023)   Humiliation, Afraid, Rape, and Kick questionnaire    Fear of Current or Ex-Partner: No    Emotionally Abused: No    Physically Abused: No    Sexually Abused: No   Family History  Problem Relation Age of Onset   Heart disease Mother    Stroke Mother    Prostate cancer Father    Past Surgical History:  Procedure Laterality Date   ABDOMINAL HYSTERECTOMY     THA and O   APPENDECTOMY     CHOLECYSTECTOMY     ESOPHAGOGASTRODUODENOSCOPY     with HH and esophagitis   OOPHORECTOMY     SPINE SURGERY     cervical fusion       Mardella Layman, MD 05/02/23 1432

## 2023-05-02 NOTE — ED Triage Notes (Signed)
Pt reports she needs a refill on  her xanax script. She states she has been out for a month and cannot sleep.

## 2023-05-06 ENCOUNTER — Encounter: Payer: Self-pay | Admitting: Adult Health

## 2023-05-06 ENCOUNTER — Ambulatory Visit (INDEPENDENT_AMBULATORY_CARE_PROVIDER_SITE_OTHER): Payer: Medicare Other | Admitting: Adult Health

## 2023-05-06 DIAGNOSIS — F411 Generalized anxiety disorder: Secondary | ICD-10-CM

## 2023-05-06 MED ORDER — ALPRAZOLAM 1 MG PO TABS: 1.0000 mg | ORAL_TABLET | Freq: Two times a day (BID) | ORAL | 2 refills | Status: DC | PRN

## 2023-05-06 NOTE — Progress Notes (Signed)
Subjective:    Patient ID: [name], female    DOB: 1936/04/09, 87 y.o.   MRN: [id number]  HPI 87 year old female who  has a past medical history of Anxiety state, ARTHRITIS (09/03/2010), Asthma, CHOLELITHIASIS (09/03/2010), COLONIC POLYPS, HX OF (06/02/2008), DIVERTICULAR DISEASE (09/03/2010), DYSPHAGIA UNSPECIFIED (09/03/2010), ESSENTIAL HYPERTENSION (10/29/2010), Fibromyalgia, GALLSTONE PANCREATITIS (09/03/2010), GERD (09/02/2007), HEART MURMUR, HX OF (08/12/2010), Hematuria, Hiatal hernia, colonoscopy, HYPERLIPIDEMIA (12/09/2007), Insomnia, Lichen simplex chronicus, Low back pain, Osteopenia, OSTEOPOROSIS (05/17/2009), Palpitations (09/03/2010), Paresthesia, Polyuria, RBBB (09/05/2010), VENTRICULAR HYPERTROPHY, LEFT (08/12/2010), VITAMIN B12 DEFICIENCY (09/02/2007), and WEIGHT LOSS, ABNORMAL (06/02/2008).  She presents to the office today for an acute visit. She was seen in the ER 4 days ago or anxiety. She had been out of Xanax for a couple of weeks and was having trouble sleeping and felt more nervous. She was given a short supply of the medication and advised to follow up with her PCP.   It should be noted that we sent in her Xanax prescription on 04/21/2023 but patient reports that the pharmacy has no record of this.   She feels better since getting back on the Xanax.  She just needs a refill of Xanax today.   Review of Systems See HPI   Past Medical History:  Diagnosis Date   Anxiety state    unspecified   ARTHRITIS 09/03/2010   Asthma    CHOLELITHIASIS 09/03/2010   COLONIC POLYPS, HX OF 06/02/2008   DIVERTICULAR DISEASE 09/03/2010   DYSPHAGIA UNSPECIFIED 09/03/2010   ESSENTIAL HYPERTENSION 10/29/2010   Fibromyalgia    GALLSTONE PANCREATITIS 09/03/2010   GERD 09/02/2007   HEART MURMUR, HX OF 08/12/2010   Hematuria    Hiatal hernia    Hx of colonoscopy    2014   HYPERLIPIDEMIA 12/09/2007   Insomnia    Lichen simplex chronicus    Low back pain    chronic   Osteopenia    OSTEOPOROSIS 05/17/2009    Palpitations 09/03/2010   event monitor reveals long RP tachycardia, likely sinus tachycardia   Paresthesia    Polyuria    RBBB 09/05/2010   VENTRICULAR HYPERTROPHY, LEFT 08/12/2010   VITAMIN B12 DEFICIENCY 09/02/2007   WEIGHT LOSS, ABNORMAL 06/02/2008    Social History   Socioeconomic History   Marital status: Widowed    Spouse name: Not on file   Number of children: 6   Years of education: Not on file   Highest education level: Not on file  Occupational History   Occupation: retired  Tobacco Use   Smoking status: Never   Smokeless tobacco: Never  Vaping Use   Vaping Use: Never used  Substance and Sexual Activity   Alcohol use: No    Alcohol/week: 0.0 standard drinks of alcohol   Drug use: No   Sexual activity: Never  Other Topics Concern   Not on file  Social History Narrative   Widowed   2 children   Lives in [place]   Retired from [employer]   Social Determinants of [employer] Strain: Low Risk  (02/11/2023)   Overall Financial Resource Strain (CARDIA)    Difficulty of Paying Living Expenses: Not hard at all  Food Insecurity: No Food Insecurity (02/11/2023)   Hunger Vital Sign    Worried About Running Out of Food in the Last Year: Never true    Ran Out of Food in the Last Year: Never true  Transportation Needs: No Transportation Needs (02/11/2023)   PRAPARE - Transportation  Lack of Transportation (Medical): No    Lack of Transportation (Non-Medical): No  Physical Activity: Sufficiently Active (02/11/2023)   Exercise Vital Sign    Days of Exercise per Week: 2 days    Minutes of Exercise per Session: 90 min  Stress: No Stress Concern Present (02/11/2023)   [employer] of Occupational Health - Occupational Stress Questionnaire    Feeling of Stress : Not at all  Social Connections: Moderately Integrated (02/11/2023)   Social Connection and Isolation Panel [NHANES]    Frequency of Communication with Friends and Family: More than three times a  week    Frequency of Social Gatherings with Friends and Family: More than three times a week    Attends Religious Services: More than 4 times per year    Active Member of [employer] or Organizations: Yes    Attends Banker Meetings: More than 4 times per year    Marital Status: Widowed  Intimate Partner Violence: Not At Risk (02/11/2023)   Humiliation, Afraid, Rape, and Kick questionnaire    Fear of Current or Ex-Partner: No    Emotionally Abused: No    Physically Abused: No    Sexually Abused: No    Past Surgical History:  Procedure Laterality Date   ABDOMINAL HYSTERECTOMY     THA and O   APPENDECTOMY     CHOLECYSTECTOMY     ESOPHAGOGASTRODUODENOSCOPY     with HH and esophagitis   OOPHORECTOMY     SPINE SURGERY     cervical fusion    Family History  Problem Relation Age of Onset   Heart disease Mother    Stroke Mother    Prostate cancer Father     Allergies  Allergen Reactions   Celebrex [Celecoxib] Rash    REACTION: rash    Current Outpatient Medications on File Prior to Visit  Medication Sig Dispense Refill   acetaminophen (TYLENOL) 650 MG CR tablet Take 650 mg by mouth every 8 (eight) hours as needed for pain (arthritis).     Ascorbic Acid (VITAMIN C) 500 MG CAPS Take 500 mg by mouth every other day.     Calcium Citrate-Vitamin D (CALCIUM CITRATE +D PO) Take 1 tablet by mouth daily.     cholecalciferol (VITAMIN D3) 25 MCG (1000 UNIT) tablet Take 1,000 Units by mouth daily.     citalopram (CELEXA) 20 MG tablet Take 1 tablet by mouth once daily 90 tablet 0   Cyanocobalamin (VITAMIN B 12 PO) Take 100 mcg by mouth daily.     esomeprazole (NEXIUM) 40 MG capsule Take 1 capsule (40 mg total) by mouth daily at 12 noon. 30 capsule 3   fluticasone (FLONASE) 50 MCG/ACT nasal spray Place 1 spray into both nostrils 2 (two) times daily. (Patient taking differently: Place 1 spray into both nostrils 2 (two) times daily as needed.) 16 g 0   magnesium hydroxide (MILK OF  MAGNESIA) 400 MG/5ML suspension Take 5 mLs by mouth daily as needed for mild constipation.     meloxicam (MOBIC) 7.5 MG tablet Take 1 tablet by mouth once daily 90 tablet 0   metoprolol tartrate (LOPRESSOR) 25 MG tablet Take 1 tablet (25 mg total) by mouth 2 (two) times daily. 180 tablet 3   Multiple Vitamin (MULTIVITAMIN) tablet Take 1 tablet by mouth daily.     POTASSIUM GLUCONATE PO Take 2 tablets by mouth.     zinc gluconate 50 MG tablet Take 50 mg by mouth daily.     No current  facility-administered medications on file prior to visit.    BP 118/60 (BP Location: Left Arm, Patient Position: Sitting, Cuff Size: Normal)   Pulse 71   Temp (!) 97.5 F (36.4 C) (Oral)   Wt 150 lb (68 kg)   SpO2 99%   BMI 24.58 kg/m       Objective:   Physical Exam Vitals and nursing note reviewed.  Constitutional:      Appearance: Normal appearance.  Cardiovascular:     Rate and Rhythm: Normal rate and regular rhythm.     Pulses: Normal pulses.     Heart sounds: Normal heart sounds.  Pulmonary:     Effort: Pulmonary effort is normal.     Breath sounds: Normal breath sounds.  Musculoskeletal:        General: Normal range of motion.  Skin:    General: Skin is warm and dry.  Neurological:     General: No focal deficit present.     Mental Status: She is alert.  Psychiatric:        Mood and Affect: Mood normal.        Behavior: Behavior normal.        Thought Content: Thought content normal.        Judgment: Judgment normal.       Assessment & Plan:  1. Generalized anxiety disorder - Will send in new prescription for her.  - ALPRAZolam (XANAX) 1 MG tablet; Take 1 tablet (1 mg total) by mouth 2 (two) times daily as needed. for anxiety  Dispense: 60 tablet; Refill: 2  Shirline Frees, NP

## 2023-07-06 ENCOUNTER — Other Ambulatory Visit: Payer: Self-pay | Admitting: Adult Health

## 2023-07-06 DIAGNOSIS — I471 Supraventricular tachycardia, unspecified: Secondary | ICD-10-CM

## 2023-07-08 ENCOUNTER — Other Ambulatory Visit: Payer: Self-pay | Admitting: Adult Health

## 2023-07-08 DIAGNOSIS — I471 Supraventricular tachycardia, unspecified: Secondary | ICD-10-CM

## 2023-07-13 ENCOUNTER — Other Ambulatory Visit: Payer: Self-pay | Admitting: Adult Health

## 2023-07-13 DIAGNOSIS — Z1231 Encounter for screening mammogram for malignant neoplasm of breast: Secondary | ICD-10-CM

## 2023-08-03 NOTE — Progress Notes (Deleted)
Patient ID: [name], female   DOB: 05-26-1936, 87 y.o.   MRN: [id number]     87 y.o. with history of benign palpitations since 2011.  History of RBBB/LAFB  and murmur of AV sclerosis Monitor at that time showed long RP tachycardia Rx with beta blocker Seen by Dr Johney Frame EP no other Rx needed.  Sees Pulmonary for loculated left pleural effusion and 3mm lung nodule.  F/U CT September 2016 improved after 3 weeks Augmentin Rx  Widowed for 22 years but worries about her 6 kids/grand kids   All living in [place] RX anxiety with xanax and Celexa Seen in ED 05/05/23 for panic attack ran out of her xanax  Has some back pain and takes Tylenol arthritis   ***  ROS: Denies fever, malais, weight loss, blurry vision, decreased visual acuity, cough, sputum, SOB, hemoptysis, pleuritic pain, palpitaitons, heartburn, abdominal pain, melena, lower extremity edema, claudication, or rash.  All other systems reviewed and negative  General: Anxious black female  Healthy:  appears younger than stated age HEENT: normal Neck supple with no adenopathy JVP normal no bruits no thyromegaly Lungs clear with no wheezing and good diaphragmatic motion Heart:  S1/S2 SEM  murmur, no rub, gallop or click PMI normal Abdomen: benighn, BS positve, no tenderness, no AAA no bruit.  No HSM or HJR Distal pulses intact with no bruits No edema Neuro non-focal Skin warm and dry No muscular weakness   Current Outpatient Medications  Medication Sig Dispense Refill   acetaminophen (TYLENOL) 650 MG CR tablet Take 650 mg by mouth every 8 (eight) hours as needed for pain (arthritis).     ALPRAZolam (XANAX) 1 MG tablet Take 1 tablet (1 mg total) by mouth 2 (two) times daily as needed. for anxiety 60 tablet 2   Ascorbic Acid (VITAMIN C) 500 MG CAPS Take 500 mg by mouth every other day.     Calcium Citrate-Vitamin D (CALCIUM CITRATE +D PO) Take 1 tablet by mouth daily.     cholecalciferol (VITAMIN D3) 25 MCG (1000  UNIT) tablet Take 1,000 Units by mouth daily.     citalopram (CELEXA) 20 MG tablet Take 1 tablet by mouth once daily 90 tablet 0   Cyanocobalamin (VITAMIN B 12 PO) Take 100 mcg by mouth daily.     esomeprazole (NEXIUM) 40 MG capsule Take 1 capsule (40 mg total) by mouth daily at 12 noon. 30 capsule 3   fluticasone (FLONASE) 50 MCG/ACT nasal spray Place 1 spray into both nostrils 2 (two) times daily. (Patient taking differently: Place 1 spray into both nostrils 2 (two) times daily as needed.) 16 g 0   magnesium hydroxide (MILK OF MAGNESIA) 400 MG/5ML suspension Take 5 mLs by mouth daily as needed for mild constipation.     meloxicam (MOBIC) 7.5 MG tablet Take 1 tablet by mouth once daily 90 tablet 0   metoprolol tartrate (LOPRESSOR) 25 MG tablet Take 1 tablet (25 mg total) by mouth 2 (two) times daily. 180 tablet 3   Multiple Vitamin (MULTIVITAMIN) tablet Take 1 tablet by mouth daily.     POTASSIUM GLUCONATE PO Take 2 tablets by mouth.     zinc gluconate 50 MG tablet Take 50 mg by mouth daily.     No current facility-administered medications for this visit.    Allergies  Celebrex [celecoxib]  Electrocardiogram:   08/03/2023 SR LAFB/RBBB rate 59 no change   Assessment and Plan  SVT stable on beta blocker no need to entertain ablation at  this point in time    Pulm:  quiescent CT  2016 with improved inflammatory changes   Anxiety: now on zoloft and xanax  f/u Dr Kirtland Bouchard   Murmur:   Benign SEM AV sclerosis no need for echo   Abnormal ECG: chronic bifascicular block no high grade AV block yearly ECG   F/U in a year   [employer]

## 2023-08-06 ENCOUNTER — Ambulatory Visit: Payer: Medicare Other | Admitting: Cardiovascular Disease

## 2023-08-11 ENCOUNTER — Ambulatory Visit: Payer: Medicare Other | Admitting: Cardiovascular Disease

## 2023-08-12 ENCOUNTER — Ambulatory Visit: Payer: Medicare Other | Admitting: Cardiovascular Disease

## 2023-08-24 ENCOUNTER — Ambulatory Visit
Admission: RE | Admit: 2023-08-24 | Discharge: 2023-08-24 | Disposition: A | Discharge status: Home / Self Care | Payer: Medicare Other | Source: Ambulatory Visit | Attending: Adult Health | Admitting: Adult Health

## 2023-08-24 DIAGNOSIS — Z1231 Encounter for screening mammogram for malignant neoplasm of breast: Secondary | ICD-10-CM | POA: Diagnosis not present

## 2023-08-28 ENCOUNTER — Other Ambulatory Visit: Payer: Self-pay | Admitting: Adult Health

## 2023-08-28 DIAGNOSIS — F411 Generalized anxiety disorder: Secondary | ICD-10-CM

## 2023-08-28 NOTE — Telephone Encounter (Signed)
Okay for refill?  

## 2023-09-07 DIAGNOSIS — M8588 Other specified disorders of bone density and structure, other site: Secondary | ICD-10-CM | POA: Diagnosis not present

## 2023-10-08 ENCOUNTER — Encounter: Payer: Self-pay | Admitting: Adult Health

## 2023-10-08 ENCOUNTER — Ambulatory Visit: Payer: Medicare Other | Admitting: Adult Health

## 2023-10-08 VITALS — BP 104/60 | HR 70 | Temp 98.2°F | Ht 64.0 in | Wt 144.0 lb

## 2023-10-08 DIAGNOSIS — M150 Primary generalized (osteo)arthritis: Secondary | ICD-10-CM

## 2023-10-08 DIAGNOSIS — K219 Gastro-esophageal reflux disease without esophagitis: Secondary | ICD-10-CM | POA: Diagnosis not present

## 2023-10-08 DIAGNOSIS — I471 Supraventricular tachycardia, unspecified: Secondary | ICD-10-CM | POA: Diagnosis not present

## 2023-10-08 DIAGNOSIS — F411 Generalized anxiety disorder: Secondary | ICD-10-CM

## 2023-10-08 DIAGNOSIS — Z0000 Encounter for general adult medical examination without abnormal findings: Secondary | ICD-10-CM

## 2023-10-08 DIAGNOSIS — Z23 Encounter for immunization: Secondary | ICD-10-CM | POA: Diagnosis not present

## 2023-10-08 MED ORDER — MELOXICAM 7.5 MG PO TABS: 7.5000 mg | ORAL_TABLET | Freq: Every day | ORAL | 1 refills | Status: DC

## 2023-10-08 MED ORDER — CITALOPRAM HYDROBROMIDE 20 MG PO TABS: 20.0000 mg | ORAL_TABLET | Freq: Every day | ORAL | 0 refills | Status: DC

## 2023-10-08 NOTE — Progress Notes (Signed)
Subjective:    Patient ID: [name], female    DOB: Feb 16, 1936, 87 y.o.   MRN: [id number]  HPI Patient presents for yearly preventative medicine examination. She is a pleasant 87 year old female who  has a past medical history of Anxiety state, ARTHRITIS (09/03/2010), Asthma, CHOLELITHIASIS (09/03/2010), COLONIC POLYPS, HX OF (06/02/2008), DIVERTICULAR DISEASE (09/03/2010), DYSPHAGIA UNSPECIFIED (09/03/2010), ESSENTIAL HYPERTENSION (10/29/2010), Fibromyalgia, GALLSTONE PANCREATITIS (09/03/2010), GERD (09/02/2007), HEART MURMUR, HX OF (08/12/2010), Hematuria, Hiatal hernia, colonoscopy, HYPERLIPIDEMIA (12/09/2007), Insomnia, Lichen simplex chronicus, Low back pain, Osteopenia, OSTEOPOROSIS (05/17/2009), Palpitations (09/03/2010), Paresthesia, Polyuria, RBBB (09/05/2010), VENTRICULAR HYPERTROPHY, LEFT (08/12/2010), VITAMIN B12 DEFICIENCY (09/02/2007), and WEIGHT LOSS, ABNORMAL (06/02/2008).  Anxiety-currently managed with Celexa 20 mg daily and Xanax 1 mg twice daily as needed. She reports for the most part she feels controlled but continues to have anxiety related to her kids. She does not have trouble sleeping   GERD-controlled with Nexium 40 mg as needed. She may take a few times a week..   Osteoarthritis-mostly in her lower back and bilateral hands.  She does take meloxicam 7.5 mg as needed and reports that this seems to resolve some of her pain and stiffness  History of SVT-followed by cardiology on a yearly basis.  Currently prescribed Lopressor 25 mg twice daily.  She denies palpitations, chest pain, or shortness of breath  All immunizations and health maintenance protocols were reviewed with the patient and needed orders were placed.  Appropriate screening laboratory values were ordered for the patient including screening of hyperlipidemia, renal function and hepatic function.   Medication reconciliation,  past medical history, social history, problem list and allergies were reviewed in detail with the  patient  Goals were established with regard to weight loss, exercise, and  diet in compliance with medications. She continues to exercise at the gym 2 days and eats pretty healthy.   Wt Readings from Last 3 Encounters:  10/08/23 144 lb (65.3 kg)  05/06/23 150 lb (68 kg)  02/11/23 148 lb 12.8 oz (67.5 kg)   Overall she is  doing well and has no acute complaints.    Review of Systems  Constitutional: Negative.   HENT: Negative.    Eyes: Negative.   Respiratory: Negative.    Cardiovascular: Negative.   Gastrointestinal: Negative.   Endocrine: Negative.   Genitourinary: Negative.   Musculoskeletal: Negative.   Skin: Negative.   Allergic/Immunologic: Negative.   Neurological: Negative.   Hematological: Negative.   Psychiatric/Behavioral: Negative.     Past Medical History:  Diagnosis Date   Anxiety state    unspecified   ARTHRITIS 09/03/2010   Asthma    CHOLELITHIASIS 09/03/2010   COLONIC POLYPS, HX OF 06/02/2008   DIVERTICULAR DISEASE 09/03/2010   DYSPHAGIA UNSPECIFIED 09/03/2010   ESSENTIAL HYPERTENSION 10/29/2010   Fibromyalgia    GALLSTONE PANCREATITIS 09/03/2010   GERD 09/02/2007   HEART MURMUR, HX OF 08/12/2010   Hematuria    Hiatal hernia    Hx of colonoscopy    2014   HYPERLIPIDEMIA 12/09/2007   Insomnia    Lichen simplex chronicus    Low back pain    chronic   Osteopenia    OSTEOPOROSIS 05/17/2009   Palpitations 09/03/2010   event monitor reveals long RP tachycardia, likely sinus tachycardia   Paresthesia    Polyuria    RBBB 09/05/2010   VENTRICULAR HYPERTROPHY, LEFT 08/12/2010   VITAMIN B12 DEFICIENCY 09/02/2007   WEIGHT LOSS, ABNORMAL 06/02/2008    Social History   Socioeconomic History  Marital status: Widowed    Spouse name: Not on file   Number of children: 6   Years of education: Not on file   Highest education level: Not on file  Occupational History   Occupation: retired  Tobacco Use   Smoking status: Never   Smokeless tobacco: Never  Vaping Use    Vaping status: Never Used  Substance and Sexual Activity   Alcohol use: No    Alcohol/week: 0.0 standard drinks of alcohol   Drug use: No   Sexual activity: Never  Other Topics Concern   Not on file  Social History Narrative   Widowed   2 children   Lives in [place]   Retired from [employer]   Social Determinants of [employer] Strain: Low Risk  (02/11/2023)   Overall Financial Resource Strain (CARDIA)    Difficulty of Paying Living Expenses: Not hard at all  Food Insecurity: No Food Insecurity (02/11/2023)   Hunger Vital Sign    Worried About Running Out of Food in the Last Year: Never true    Ran Out of Food in the Last Year: Never true  Transportation Needs: No Transportation Needs (02/11/2023)   PRAPARE - Administrator, Civil Service (Medical): No    Lack of Transportation (Non-Medical): No  Physical Activity: Sufficiently Active (02/11/2023)   Exercise Vital Sign    Days of Exercise per Week: 2 days    Minutes of Exercise per Session: 90 min  Stress: No Stress Concern Present (02/11/2023)   [employer] of Occupational Health - Occupational Stress Questionnaire    Feeling of Stress : Not at all  Social Connections: Moderately Integrated (02/11/2023)   Social Connection and Isolation Panel [NHANES]    Frequency of Communication with Friends and Family: More than three times a week    Frequency of Social Gatherings with Friends and Family: More than three times a week    Attends Religious Services: More than 4 times per year    Active Member of [employer] or Organizations: Yes    Attends Banker Meetings: More than 4 times per year    Marital Status: Widowed  Intimate Partner Violence: Not At Risk (02/11/2023)   Humiliation, Afraid, Rape, and Kick questionnaire    Fear of Current or Ex-Partner: No    Emotionally Abused: No    Physically Abused: No    Sexually Abused: No    Past Surgical History:  Procedure Laterality Date    ABDOMINAL HYSTERECTOMY     THA and O   APPENDECTOMY     CHOLECYSTECTOMY     ESOPHAGOGASTRODUODENOSCOPY     with HH and esophagitis   OOPHORECTOMY     SPINE SURGERY     cervical fusion    Family History  Problem Relation Age of Onset   Heart disease Mother    Stroke Mother    Prostate cancer Father     Allergies  Allergen Reactions   Celebrex [Celecoxib] Rash    REACTION: rash    Current Outpatient Medications on File Prior to Visit  Medication Sig Dispense Refill   acetaminophen (TYLENOL) 650 MG CR tablet Take 650 mg by mouth every 8 (eight) hours as needed for pain (arthritis).     ALPRAZolam (XANAX) 1 MG tablet Take 1 tablet by mouth twice daily as needed for anxiety 60 tablet 2   Ascorbic Acid (VITAMIN C) 500 MG CAPS Take 500 mg by mouth every other day.  Calcium Citrate-Vitamin D (CALCIUM CITRATE +D PO) Take 1 tablet by mouth daily.     cholecalciferol (VITAMIN D3) 25 MCG (1000 UNIT) tablet Take 1,000 Units by mouth daily.     citalopram (CELEXA) 20 MG tablet Take 1 tablet by mouth once daily 90 tablet 0   Cyanocobalamin (VITAMIN B 12 PO) Take 100 mcg by mouth daily.     fluticasone (FLONASE) 50 MCG/ACT nasal spray Place 1 spray into both nostrils 2 (two) times daily. (Patient taking differently: Place 1 spray into both nostrils 2 (two) times daily as needed.) 16 g 0   magnesium hydroxide (MILK OF MAGNESIA) 400 MG/5ML suspension Take 5 mLs by mouth daily as needed for mild constipation.     meloxicam (MOBIC) 7.5 MG tablet Take 1 tablet by mouth once daily 90 tablet 0   metoprolol tartrate (LOPRESSOR) 25 MG tablet Take 1 tablet (25 mg total) by mouth 2 (two) times daily. 180 tablet 3   Multiple Vitamin (MULTIVITAMIN) tablet Take 1 tablet by mouth daily.     POTASSIUM GLUCONATE PO Take 2 tablets by mouth.     zinc gluconate 50 MG tablet Take 50 mg by mouth daily.     No current facility-administered medications on file prior to visit.    BP 104/60   Pulse 70   Temp  98.2 F (36.8 C) (Oral)   Ht 5\' 4"  (1.626 m)   Wt 144 lb (65.3 kg)   SpO2 98%   BMI 24.72 kg/m       Objective:   Physical Exam Vitals and nursing note reviewed.  Constitutional:      General: She is not in acute distress.    Appearance: Normal appearance. She is not ill-appearing.  HENT:     Head: Normocephalic and atraumatic.     Right Ear: Tympanic membrane, ear canal and external ear normal. There is no impacted cerumen.     Left Ear: Tympanic membrane, ear canal and external ear normal. There is no impacted cerumen.     Nose: Nose normal. No congestion or rhinorrhea.     Mouth/Throat:     Mouth: Mucous membranes are moist.     Pharynx: Oropharynx is clear.  Eyes:     Extraocular Movements: Extraocular movements intact.     Conjunctiva/sclera: Conjunctivae normal.     Pupils: Pupils are equal, round, and reactive to light.  Neck:     Vascular: No carotid bruit.  Cardiovascular:     Rate and Rhythm: Normal rate and regular rhythm.     Pulses: Normal pulses.     Heart sounds: No murmur heard.    No friction rub. No gallop.  Pulmonary:     Effort: Pulmonary effort is normal.     Breath sounds: Normal breath sounds.  Abdominal:     General: Abdomen is flat. Bowel sounds are normal. There is no distension.     Palpations: Abdomen is soft. There is no mass.     Tenderness: There is no abdominal tenderness. There is no guarding or rebound.     Hernia: No hernia is present.  Musculoskeletal:        General: Normal range of motion.     Cervical back: Normal range of motion and neck supple.  Lymphadenopathy:     Cervical: No cervical adenopathy.  Skin:    General: Skin is warm and dry.     Capillary Refill: Capillary refill takes less than 2 seconds.  Neurological:     General:  No focal deficit present.     Mental Status: She is alert and oriented to person, place, and time.  Psychiatric:        Mood and Affect: Mood normal.        Behavior: Behavior normal.         Thought Content: Thought content normal.        Judgment: Judgment normal.        Assessment & Plan:  1. Routine general medical examination at a health care facility Today patient counseled on age appropriate routine health concerns for screening and prevention, each reviewed and up to date or declined. Immunizations reviewed and up to date or declined. Labs ordered and reviewed. Risk factors for depression reviewed and negative. Hearing function and visual acuity are intact. ADLs screened and addressed as needed. Functional ability and level of safety reviewed and appropriate. Education, counseling and referrals performed based on assessed risks today. Patient provided with a copy of personalized plan for preventive services. - Continue to stay active and eat healthy  - Follow up in one year or sooner if needed  2. Generalized anxiety disorder - Conitnue with Celexa daily and Xanax PRN - CBC with Differential/Platelet; Future - Comprehensive metabolic panel; Future - Lipid panel; Future - TSH; Future  3. Gastroesophageal reflux disease without esophagitis - Continue with PPI  - CBC with Differential/Platelet; Future - Comprehensive metabolic panel; Future - Lipid panel; Future - TSH; Future  4. Primary osteoarthritis involving multiple joints - Continue with Mobic  - CBC with Differential/Platelet; Future - Comprehensive metabolic panel; Future - Lipid panel; Future - TSH; Future  5. SVT (supraventricular tachycardia) (HCC)  - CBC with Differential/Platelet; Future - Comprehensive metabolic panel; Future - Lipid panel; Future - TSH; Future - citalopram (CELEXA) 20 MG tablet; Take 1 tablet (20 mg total) by mouth daily.  Dispense: 90 tablet; Refill: 0  6. Need for influenza vaccination  - Flu Vaccine Trivalent High Dose (Fluad)

## 2023-10-08 NOTE — Patient Instructions (Signed)
It was great seeing you today   We will follow up with you regarding your lab work   Please let me know if you need anything   

## 2023-10-20 ENCOUNTER — Other Ambulatory Visit: Payer: Medicare Other

## 2023-10-20 ENCOUNTER — Other Ambulatory Visit (INDEPENDENT_AMBULATORY_CARE_PROVIDER_SITE_OTHER): Payer: Medicare Other

## 2023-10-20 DIAGNOSIS — K219 Gastro-esophageal reflux disease without esophagitis: Secondary | ICD-10-CM | POA: Diagnosis not present

## 2023-10-20 DIAGNOSIS — I471 Supraventricular tachycardia, unspecified: Secondary | ICD-10-CM | POA: Diagnosis not present

## 2023-10-20 DIAGNOSIS — M150 Primary generalized (osteo)arthritis: Secondary | ICD-10-CM

## 2023-10-20 DIAGNOSIS — M8580 Other specified disorders of bone density and structure, unspecified site: Secondary | ICD-10-CM | POA: Diagnosis not present

## 2023-10-20 DIAGNOSIS — F411 Generalized anxiety disorder: Secondary | ICD-10-CM

## 2023-10-20 LAB — COMPREHENSIVE METABOLIC PANEL
ALT: 16 U/L (ref 0–35)
AST: 18 U/L (ref 0–37)
Albumin: 4 g/dL (ref 3.5–5.2)
Alkaline Phosphatase: 108 U/L (ref 39–117)
BUN: 11 mg/dL (ref 6–23)
CO2: 28 meq/L (ref 19–32)
Calcium: 9.4 mg/dL (ref 8.4–10.5)
Chloride: 102 meq/L (ref 96–112)
Creatinine, Ser: 0.52 mg/dL (ref 0.40–1.20)
GFR: 84.52 mL/min (ref >60.00)
Glucose, Bld: 98 mg/dL (ref 70–99)
Potassium: 3.6 meq/L (ref 3.5–5.1)
Sodium: 137 meq/L (ref 135–145)
Total Bilirubin: 0.8 mg/dL (ref 0.2–1.2)
Total Protein: 6.9 g/dL (ref 6.0–8.3)

## 2023-10-20 LAB — CBC WITH DIFFERENTIAL/PLATELET
Basophils Absolute: 0 10*3/uL (ref 0.0–0.1)
Basophils Relative: 0.9 % (ref 0.0–3.0)
Eosinophils Absolute: 0.3 10*3/uL (ref 0.0–0.7)
Eosinophils Relative: 6.4 % — ABNORMAL HIGH (ref 0.0–5.0)
HCT: 37.5 % (ref 36.0–46.0)
Hemoglobin: 12 g/dL (ref 12.0–15.0)
Lymphocytes Relative: 32.7 % (ref 12.0–46.0)
Lymphs Abs: 1.7 10*3/uL (ref 0.7–4.0)
MCHC: 32.1 g/dL (ref 30.0–36.0)
MCV: 90.1 fL (ref 78.0–100.0)
Monocytes Absolute: 0.5 10*3/uL (ref 0.1–1.0)
Monocytes Relative: 9.2 % (ref 3.0–12.0)
Neutro Abs: 2.6 10*3/uL (ref 1.4–7.7)
Neutrophils Relative %: 50.8 % (ref 43.0–77.0)
Platelets: 250 10*3/uL (ref 150.0–400.0)
RBC: 4.17 Mil/uL (ref 3.87–5.11)
RDW: 14.7 % (ref 11.5–15.5)
WBC: 5.2 10*3/uL (ref 4.0–10.5)

## 2023-10-20 LAB — LIPID PANEL
Cholesterol: 161 mg/dL (ref 0–200)
HDL: 61 mg/dL (ref >39.00)
LDL Cholesterol: 89 mg/dL (ref 0–99)
NonHDL: 99.86
Total CHOL/HDL Ratio: 3
Triglycerides: 52 mg/dL (ref 0.0–149.0)
VLDL: 10.4 mg/dL (ref 0.0–40.0)

## 2023-10-20 LAB — TSH: TSH: 1.56 u[IU]/mL (ref 0.35–5.50)

## 2023-12-04 NOTE — Progress Notes (Unsigned)
Patient ID: [name], female   DOB: 02-Feb-1936, 87 y.o.   MRN: [id number]     87 y.o. with history of benign palpitations since 2011.  History of RBBB/LAFB  and murmur of AV sclerosis Monitor at that time showed long RP tachycardia Rx with beta blocker Seen by Dr Johney Frame EP no other Rx needed.  Sees Pulmonary for loculated left pleural effusion and 3mm lung nodule.  F/U CT September 2016 improved after 3 weeks Augmentin Rx  Widowed for 22 years but worries about her 6 kids/grand kids   All living in [place] RX anxiety with xanax and Celexa  Has some back pain aand arthritis in her hands Uses meloxicam  Lost a sister a couple months ago Still with brother and sister locally  ROS: Denies fever, malais, weight loss, blurry vision, decreased visual acuity, cough, sputum, SOB, hemoptysis, pleuritic pain, palpitaitons, heartburn, abdominal pain, melena, lower extremity edema, claudication, or rash.  All other systems reviewed and negative  General: Anxious black female  Healthy:  appears younger than stated age HEENT: normal Neck supple with no adenopathy JVP normal no bruits no thyromegaly Lungs clear with no wheezing and good diaphragmatic motion Heart:  S1/S2 SEM  murmur, no rub, gallop or click PMI normal Abdomen: benighn, BS positve, no tenderness, no AAA no bruit.  No HSM or HJR Distal pulses intact with no bruits No edema Neuro non-focal Skin warm and dry No muscular weakness   Current Outpatient Medications  Medication Sig Dispense Refill   acetaminophen (TYLENOL) 650 MG CR tablet Take 650 mg by mouth every 8 (eight) hours as needed for pain (arthritis).     ALPRAZolam (XANAX) 1 MG tablet Take 1 tablet by mouth twice daily as needed for anxiety 60 tablet 2   Ascorbic Acid (VITAMIN C) 500 MG CAPS Take 500 mg by mouth every other day.     Calcium Citrate-Vitamin D (CALCIUM CITRATE +D PO) Take 1 tablet by mouth daily.     cholecalciferol (VITAMIN D3) 25 MCG (1000  UNIT) tablet Take 1,000 Units by mouth daily.     citalopram (CELEXA) 20 MG tablet Take 1 tablet (20 mg total) by mouth daily. 90 tablet 0   Cyanocobalamin (VITAMIN B 12 PO) Take 100 mcg by mouth daily.     fluticasone (FLONASE) 50 MCG/ACT nasal spray Place 1 spray into both nostrils 2 (two) times daily. (Patient taking differently: Place 1 spray into both nostrils 2 (two) times daily as needed.) 16 g 0   magnesium hydroxide (MILK OF MAGNESIA) 400 MG/5ML suspension Take 5 mLs by mouth daily as needed for mild constipation.     meloxicam (MOBIC) 7.5 MG tablet Take 1 tablet (7.5 mg total) by mouth daily. 90 tablet 1   metoprolol tartrate (LOPRESSOR) 25 MG tablet Take 1 tablet (25 mg total) by mouth 2 (two) times daily. 180 tablet 3   Multiple Vitamin (MULTIVITAMIN) tablet Take 1 tablet by mouth daily.     POTASSIUM GLUCONATE PO Take 2 tablets by mouth.     zinc gluconate 50 MG tablet Take 50 mg by mouth daily.     No current facility-administered medications for this visit.    Allergies  Celebrex [celecoxib]  Electrocardiogram:   12/10/2023 SR LAFB/RBBB rate 59 no change   Assessment and Plan  SVT stable on beta blocker no need to entertain ablation at this point in time  TSH normal 10/20/23  Hct 37.5   Pulm:  quiescent CT  2016 with improved  inflammatory changes   Anxiety: now on zoloft and xanax  f/u Dr Kirtland Bouchard   Murmur:   Benign SEM AV sclerosis no need for echo   Abnormal ECG: chronic bifascicular block no high grade AV block yearly ECG     F/U in a year   [employer]

## 2023-12-10 ENCOUNTER — Ambulatory Visit: Payer: Medicare Other | Attending: Cardiovascular Disease | Admitting: Cardiovascular Disease

## 2023-12-10 VITALS — BP 102/62 | HR 70 | Ht 64.0 in | Wt 138.4 lb

## 2023-12-10 DIAGNOSIS — R011 Cardiac murmur, unspecified: Secondary | ICD-10-CM | POA: Diagnosis not present

## 2023-12-10 DIAGNOSIS — I471 Supraventricular tachycardia, unspecified: Secondary | ICD-10-CM | POA: Diagnosis not present

## 2023-12-10 DIAGNOSIS — R9431 Abnormal electrocardiogram [ECG] [EKG]: Secondary | ICD-10-CM

## 2023-12-10 NOTE — Patient Instructions (Signed)

## 2023-12-18 ENCOUNTER — Other Ambulatory Visit: Payer: Self-pay | Admitting: Adult Health

## 2023-12-18 DIAGNOSIS — F411 Generalized anxiety disorder: Secondary | ICD-10-CM

## 2023-12-18 NOTE — Telephone Encounter (Signed)
Okay for refill?   CPE 10/08/2023

## 2023-12-18 NOTE — Telephone Encounter (Signed)
Copied from CRM [id number]. Topic: Clinical - Medication Refill >> Dec 18, 2023  9:47 AM Efraim Kaufmann C wrote: Most Recent Primary Care Visit:  Provider: [facility]  Department: [facility]  Visit Type: LAB  Date: 10/20/2023  Medication: ALPRAZolam Prudy Feeler) 1 MG tablet  Has the patient contacted their pharmacy? Yes (Agent: If no, request that the patient contact the pharmacy for the refill. If patient does not wish to contact the pharmacy document the reason why and proceed with request.) (Agent: If yes, when and what did the pharmacy advise?) This morning, and that patient contact her MD  Is this the correct pharmacy for this prescription? Yes If no, delete pharmacy and type the correct one.  This is the patient's preferred pharmacy:  [facility] [address] [place] - [address] [place] [address] [place] [address] Phone: [phone] Fax: [phone]   Has the prescription been filled recently? No  Is the patient out of the medication? Yes  Has the patient been seen for an appointment in the last year OR does the patient have an upcoming appointment? Yes  Can we respond through MyChart? No  Agent: Please be advised that Rx refills may take up to 3 business days. We ask that you follow-up with your pharmacy.

## 2024-01-05 ENCOUNTER — Other Ambulatory Visit: Payer: Self-pay | Admitting: Adult Health

## 2024-01-05 DIAGNOSIS — I471 Supraventricular tachycardia, unspecified: Secondary | ICD-10-CM

## 2024-02-08 DIAGNOSIS — Z6824 Body mass index (BMI) 24.0-24.9, adult: Secondary | ICD-10-CM | POA: Diagnosis not present

## 2024-02-09 ENCOUNTER — Ambulatory Visit: Payer: Medicare Other | Admitting: Adult Health

## 2024-02-11 ENCOUNTER — Ambulatory Visit (INDEPENDENT_AMBULATORY_CARE_PROVIDER_SITE_OTHER): Payer: Medicare Other | Admitting: Adult Health

## 2024-02-11 ENCOUNTER — Ambulatory Visit: Payer: Medicare Other

## 2024-02-11 VITALS — BP 118/60 | HR 65 | Temp 97.8°F | Ht 64.0 in | Wt 143.0 lb

## 2024-02-11 DIAGNOSIS — M25512 Pain in left shoulder: Secondary | ICD-10-CM | POA: Diagnosis not present

## 2024-02-11 DIAGNOSIS — M150 Primary generalized (osteo)arthritis: Secondary | ICD-10-CM

## 2024-02-11 DIAGNOSIS — M778 Other enthesopathies, not elsewhere classified: Secondary | ICD-10-CM | POA: Diagnosis not present

## 2024-02-11 DIAGNOSIS — M19012 Primary osteoarthritis, left shoulder: Secondary | ICD-10-CM | POA: Diagnosis not present

## 2024-02-11 DIAGNOSIS — F411 Generalized anxiety disorder: Secondary | ICD-10-CM

## 2024-02-11 MED ORDER — ALPRAZOLAM 1 MG PO TABS: 1.0000 mg | ORAL_TABLET | Freq: Two times a day (BID) | ORAL | 2 refills | Status: DC | PRN

## 2024-02-11 MED ORDER — MELOXICAM 15 MG PO TABS: 15.0000 mg | ORAL_TABLET | Freq: Every day | ORAL | 1 refills | Status: DC

## 2024-02-11 NOTE — Patient Instructions (Signed)
I have increased your Meloxicam from 7.5 to 15 mg daily   I have sent in a new prescription for xanax  Get some Metamucil at the drug store and mix this with water or juice every morning

## 2024-02-11 NOTE — Progress Notes (Signed)
 Subjective:    Patient ID: [name], female    DOB: 1936/06/23, 88 y.o.   MRN: [id number]  HPI 88 year old female who  has a past medical history of Anxiety state, ARTHRITIS (09/03/2010), Asthma, CHOLELITHIASIS (09/03/2010), COLONIC POLYPS, HX OF (06/02/2008), DIVERTICULAR DISEASE (09/03/2010), DYSPHAGIA UNSPECIFIED (09/03/2010), ESSENTIAL HYPERTENSION (10/29/2010), Fibromyalgia, GALLSTONE PANCREATITIS (09/03/2010), GERD (09/02/2007), HEART MURMUR, HX OF (08/12/2010), Hematuria, Hiatal hernia, colonoscopy, HYPERLIPIDEMIA (12/09/2007), Insomnia, Lichen simplex chronicus, Low back pain, Osteopenia, OSTEOPOROSIS (05/17/2009), Palpitations (09/03/2010), Paresthesia, Polyuria, RBBB (09/05/2010), VENTRICULAR HYPERTROPHY, LEFT (08/12/2010), VITAMIN B12 DEFICIENCY (09/02/2007), and WEIGHT LOSS, ABNORMAL (06/02/2008).  She has a history of chronic low back pain for which she takes meloxicam 7.5 mg, over the last few weeks she has had more frequent pain, this pain feels the same as it has in the past but is not always alleviated with meloxicam.  Over the past 3 weeks she has also developed pain in her left shoulder that seems to be intermittent.  She will get a "catch" "in her left shoulder when she reaches above her head or behind her back this coincides with more severe pain when she does these types of motions.  She denies any trauma or aggravating injury.  She also needs a new prescription for Xanax, she reports that when she got her last refill it was only for 30 tablets which in the past we have done 60 tablets and she is running out of her prescription sooner.   Review of Systems See HPI   Past Medical History:  Diagnosis Date   Anxiety state    unspecified   ARTHRITIS 09/03/2010   Asthma    CHOLELITHIASIS 09/03/2010   COLONIC POLYPS, HX OF 06/02/2008   DIVERTICULAR DISEASE 09/03/2010   DYSPHAGIA UNSPECIFIED 09/03/2010   ESSENTIAL HYPERTENSION 10/29/2010   Fibromyalgia    GALLSTONE PANCREATITIS 09/03/2010   GERD  09/02/2007   HEART MURMUR, HX OF 08/12/2010   Hematuria    Hiatal hernia    Hx of colonoscopy    2014   HYPERLIPIDEMIA 12/09/2007   Insomnia    Lichen simplex chronicus    Low back pain    chronic   Osteopenia    OSTEOPOROSIS 05/17/2009   Palpitations 09/03/2010   event monitor reveals long RP tachycardia, likely sinus tachycardia   Paresthesia    Polyuria    RBBB 09/05/2010   VENTRICULAR HYPERTROPHY, LEFT 08/12/2010   VITAMIN B12 DEFICIENCY 09/02/2007   WEIGHT LOSS, ABNORMAL 06/02/2008    Social History   Socioeconomic History   Marital status: Widowed    Spouse name: Not on file   Number of children: 6   Years of education: Not on file   Highest education level: Not on file  Occupational History   Occupation: retired  Tobacco Use   Smoking status: Never   Smokeless tobacco: Never  Vaping Use   Vaping status: Never Used  Substance and Sexual Activity   Alcohol use: No    Alcohol/week: 0.0 standard drinks of alcohol   Drug use: No   Sexual activity: Never  Other Topics Concern   Not on file  Social History Narrative   Widowed   2 children   Lives in [place]   Retired from [employer]   Social Drivers of [employer] Strain: Low Risk  (02/11/2023)   Overall Financial Resource Strain (CARDIA)    Difficulty of Paying Living Expenses: Not hard at all  Food Insecurity: No Food Insecurity (02/11/2023)  Hunger Vital Sign    Worried About Running Out of Food in the Last Year: Never true    Ran Out of Food in the Last Year: Never true  Transportation Needs: No Transportation Needs (02/11/2023)   PRAPARE - Administrator, Civil Service (Medical): No    Lack of Transportation (Non-Medical): No  Physical Activity: Sufficiently Active (02/11/2023)   Exercise Vital Sign    Days of Exercise per Week: 2 days    Minutes of Exercise per Session: 90 min  Stress: No Stress Concern Present (02/11/2023)   [employer] of Occupational Health - Occupational  Stress Questionnaire    Feeling of Stress : Not at all  Social Connections: Moderately Integrated (02/11/2023)   Social Connection and Isolation Panel [NHANES]    Frequency of Communication with Friends and Family: More than three times a week    Frequency of Social Gatherings with Friends and Family: More than three times a week    Attends Religious Services: More than 4 times per year    Active Member of [employer] or Organizations: Yes    Attends Banker Meetings: More than 4 times per year    Marital Status: Widowed  Intimate Partner Violence: Not At Risk (02/11/2023)   Humiliation, Afraid, Rape, and Kick questionnaire    Fear of Current or Ex-Partner: No    Emotionally Abused: No    Physically Abused: No    Sexually Abused: No    Past Surgical History:  Procedure Laterality Date   ABDOMINAL HYSTERECTOMY     THA and O   APPENDECTOMY     CHOLECYSTECTOMY     ESOPHAGOGASTRODUODENOSCOPY     with HH and esophagitis   OOPHORECTOMY     SPINE SURGERY     cervical fusion    Family History  Problem Relation Age of Onset   Heart disease Mother    Stroke Mother    Prostate cancer Father     Allergies  Allergen Reactions   Celebrex [Celecoxib] Rash    REACTION: rash    Current Outpatient Medications on File Prior to Visit  Medication Sig Dispense Refill   acetaminophen (TYLENOL) 650 MG CR tablet Take 650 mg by mouth every 8 (eight) hours as needed for pain (arthritis).     ALPRAZolam (XANAX) 1 MG tablet Take 1 tablet (1 mg total) by mouth at bedtime as needed for anxiety. 30 tablet 2   Ascorbic Acid (VITAMIN C) 500 MG CAPS Take 500 mg by mouth every other day.     Calcium Citrate-Vitamin D (CALCIUM CITRATE +D PO) Take 1 tablet by mouth daily.     cholecalciferol (VITAMIN D3) 25 MCG (1000 UNIT) tablet Take 1,000 Units by mouth daily.     citalopram (CELEXA) 20 MG tablet Take 1 tablet by mouth once daily 90 tablet 0   Cyanocobalamin (VITAMIN B 12 PO) Take 100 mcg by  mouth daily.     fluticasone (FLONASE) 50 MCG/ACT nasal spray Place 1 spray into both nostrils 2 (two) times daily. (Patient taking differently: Place 1 spray into both nostrils 2 (two) times daily as needed.) 16 g 0   magnesium hydroxide (MILK OF MAGNESIA) 400 MG/5ML suspension Take 5 mLs by mouth daily as needed for mild constipation.     meloxicam (MOBIC) 7.5 MG tablet Take 1 tablet (7.5 mg total) by mouth daily. 90 tablet 1   metoprolol tartrate (LOPRESSOR) 25 MG tablet Take 1 tablet (25 mg total) by mouth 2 (two)  times daily. 180 tablet 3   Multiple Vitamin (MULTIVITAMIN) tablet Take 1 tablet by mouth daily.     POTASSIUM GLUCONATE PO Take 2 tablets by mouth.     zinc gluconate 50 MG tablet Take 50 mg by mouth daily.     No current facility-administered medications on file prior to visit.    BP 118/60   Pulse 65   Temp 97.8 F (36.6 C) (Oral)   Ht 5\' 4"  (1.626 m)   Wt 143 lb (64.9 kg)   SpO2 100%   BMI 24.55 kg/m       Objective:   Physical Exam Vitals and nursing note reviewed.  Constitutional:      Appearance: Normal appearance.  Musculoskeletal:     Left shoulder: Bony tenderness and crepitus present. Decreased range of motion. Normal strength. Normal pulse.       Back:     Comments: She is able to raise her arm above 90 degrees but has trouble with doing so, same for back scratch test.  Neurological:     Mental Status: She is alert.        Assessment & Plan:  1. Primary osteoarthritis involving multiple joints -Will increase her meloxicam to 15 mg daily.   - meloxicam (MOBIC) 15 MG tablet; Take 1 tablet (15 mg total) by mouth daily.  Dispense: 90 tablet; Refill: 1  2. Generalized anxiety disorder  - ALPRAZolam (XANAX) 1 MG tablet; Take 1 tablet (1 mg total) by mouth 2 (two) times daily as needed for anxiety.  Dispense: 60 tablet; Refill: 2  3. Acute pain of left shoulder (Primary) -Likely rotator cuff impingement versus osteoarthritis.  We discussed doing a  steroid injection.  She would like to have an x-ray of the shoulder prior to having her steroid injection.  Hopefully the meloxicam 15 mg will also help with discomfort.  Consider referral to orthopedics once x-ray comes back - DG Shoulder Left; Future

## 2024-02-22 ENCOUNTER — Ambulatory Visit (INDEPENDENT_AMBULATORY_CARE_PROVIDER_SITE_OTHER): Payer: Medicare Other

## 2024-02-22 VITALS — BP 122/62 | HR 75 | Temp 97.5°F | Ht 64.0 in | Wt 142.7 lb

## 2024-02-22 DIAGNOSIS — Z0000 Encounter for general adult medical examination without abnormal findings: Secondary | ICD-10-CM | POA: Diagnosis not present

## 2024-02-22 NOTE — Progress Notes (Signed)
 Subjective:   [name] is a 88 y.o. female who presents for Medicare Annual (Subsequent) preventive examination.  Visit Complete: In person     Cardiac Risk Factors include: advanced age (>60men, >59 women);hypertension     Objective:    Today's Vitals   02/22/24 1303  BP: 122/62  Pulse: 75  Temp: (!) 97.5 F (36.4 C)  TempSrc: Oral  SpO2: 97%  Weight: 142 lb 11.2 oz (64.7 kg)  Height: 5\' 4"  (1.626 m)   Body mass index is 24.49 kg/m.     02/22/2024    1:27 PM 02/11/2023   11:35 AM 01/23/2022    1:21 PM 06/01/2021   12:44 PM 01/09/2021   10:36 AM 08/06/2015   11:38 AM 05/26/2015    8:23 PM  Advanced Directives  Does Patient Have a Medical Advance Directive? No Yes Yes No Yes Yes No  Type of Special educational needs teacher of [place];Living will Healthcare Power of [place];Living will  Healthcare Power of [place];Living will Healthcare Power of [place];Living will   Does patient want to make changes to medical advance directive?   No - Patient declined  No - Patient declined    Copy of Healthcare Power of Attorney in Chart?  No - copy requested No - copy requested  No - copy requested    Would patient like information on creating a medical advance directive? No - Patient declined   No - Patient declined   No - patient declined information    Current Medications (verified) Outpatient Encounter Medications as of 02/22/2024  Medication Sig   acetaminophen (TYLENOL) 650 MG CR tablet Take 650 mg by mouth every 8 (eight) hours as needed for pain (arthritis).   ALPRAZolam (XANAX) 1 MG tablet Take 1 tablet (1 mg total) by mouth 2 (two) times daily as needed for anxiety.   Ascorbic Acid (VITAMIN C) 500 MG CAPS Take 500 mg by mouth every other day.   Calcium Citrate-Vitamin D (CALCIUM CITRATE +D PO) Take 1 tablet by mouth daily.   cholecalciferol (VITAMIN D3) 25 MCG (1000 UNIT) tablet Take 1,000 Units by mouth daily.   citalopram (CELEXA) 20 MG tablet Take 1  tablet by mouth once daily   Cyanocobalamin (VITAMIN B 12 PO) Take 100 mcg by mouth daily.   fluticasone (FLONASE) 50 MCG/ACT nasal spray Place 1 spray into both nostrils 2 (two) times daily. (Patient taking differently: Place 1 spray into both nostrils 2 (two) times daily as needed.)   magnesium hydroxide (MILK OF MAGNESIA) 400 MG/5ML suspension Take 5 mLs by mouth daily as needed for mild constipation.   meloxicam (MOBIC) 15 MG tablet Take 1 tablet (15 mg total) by mouth daily.   metoprolol tartrate (LOPRESSOR) 25 MG tablet Take 1 tablet (25 mg total) by mouth 2 (two) times daily.   Multiple Vitamin (MULTIVITAMIN) tablet Take 1 tablet by mouth daily.   POTASSIUM GLUCONATE PO Take 2 tablets by mouth.   zinc gluconate 50 MG tablet Take 50 mg by mouth daily.   No facility-administered encounter medications on file as of 02/22/2024.    Allergies (verified) Celebrex [celecoxib]   History: Past Medical History:  Diagnosis Date   Anxiety state    unspecified   ARTHRITIS 09/03/2010   Asthma    CHOLELITHIASIS 09/03/2010   COLONIC POLYPS, HX OF 06/02/2008   DIVERTICULAR DISEASE 09/03/2010   DYSPHAGIA UNSPECIFIED 09/03/2010   ESSENTIAL HYPERTENSION 10/29/2010   Fibromyalgia    GALLSTONE PANCREATITIS 09/03/2010   GERD  09/02/2007   HEART MURMUR, HX OF 08/12/2010   Hematuria    Hiatal hernia    Hx of colonoscopy    2014   HYPERLIPIDEMIA 12/09/2007   Insomnia    Lichen simplex chronicus    Low back pain    chronic   Osteopenia    OSTEOPOROSIS 05/17/2009   Palpitations 09/03/2010   event monitor reveals long RP tachycardia, likely sinus tachycardia   Paresthesia    Polyuria    RBBB 09/05/2010   VENTRICULAR HYPERTROPHY, LEFT 08/12/2010   VITAMIN B12 DEFICIENCY 09/02/2007   WEIGHT LOSS, ABNORMAL 06/02/2008   Past Surgical History:  Procedure Laterality Date   ABDOMINAL HYSTERECTOMY     THA and O   APPENDECTOMY     CHOLECYSTECTOMY     ESOPHAGOGASTRODUODENOSCOPY     with HH and esophagitis    OOPHORECTOMY     SPINE SURGERY     cervical fusion   Family History  Problem Relation Age of Onset   Heart disease Mother    Stroke Mother    Prostate cancer Father    Social History   Socioeconomic History   Marital status: Widowed    Spouse name: Not on file   Number of children: 6   Years of education: Not on file   Highest education level: Not on file  Occupational History   Occupation: retired  Tobacco Use   Smoking status: Never   Smokeless tobacco: Never  Vaping Use   Vaping status: Never Used  Substance and Sexual Activity   Alcohol use: No    Alcohol/week: 0.0 standard drinks of alcohol   Drug use: No   Sexual activity: Never  Other Topics Concern   Not on file  Social History Narrative   Widowed   2 children   Lives in [place]   Retired from [employer]   Social Drivers of [employer] Strain: Low Risk  (02/22/2024)   Overall Financial Resource Strain (CARDIA)    Difficulty of Paying Living Expenses: Not hard at all  Food Insecurity: No Food Insecurity (02/22/2024)   Hunger Vital Sign    Worried About Running Out of Food in the Last Year: Never true    Ran Out of Food in the Last Year: Never true  Transportation Needs: No Transportation Needs (02/22/2024)   PRAPARE - Administrator, Civil Service (Medical): No    Lack of Transportation (Non-Medical): No  Physical Activity: Sufficiently Active (02/22/2024)   Exercise Vital Sign    Days of Exercise per Week: 2 days    Minutes of Exercise per Session: 120 min  Stress: No Stress Concern Present (02/22/2024)   [employer] of Occupational Health - Occupational Stress Questionnaire    Feeling of Stress : Not at all  Social Connections: Moderately Integrated (02/22/2024)   Social Connection and Isolation Panel [NHANES]    Frequency of Communication with Friends and Family: More than three times a week    Frequency of Social Gatherings with Friends and Family: More than three  times a week    Attends Religious Services: More than 4 times per year    Active Member of [employer] or Organizations: Yes    Attends Banker Meetings: More than 4 times per year    Marital Status: Widowed    Tobacco Counseling Counseling given: Not Answered   Clinical Intake:  Pre-visit preparation completed: Yes  Pain : No/denies pain     BMI - recorded: 24.49  Nutritional Status: BMI of 19-24  Normal Nutritional Risks: None Diabetes: No  How often do you need to have someone help you when you read instructions, pamphlets, or other written materials from your doctor or pharmacy?: 1 - Never  Interpreter Needed?: No  Information entered by :: Enslee Bibbins LPN   Activities of Daily Living    02/22/2024    1:26 PM  In your present state of health, do you have any difficulty performing the following activities:  Hearing? 0  Vision? 0  Difficulty concentrating or making decisions? 0  Walking or climbing stairs? 0  Dressing or bathing? 0  Doing errands, shopping? 0  Preparing Food and eating ? N  Using the Toilet? N  In the past six months, have you accidently leaked urine? N  Do you have problems with loss of bowel control? N  Managing your Medications? N  Managing your Finances? N  Housekeeping or managing your Housekeeping? N    Patient Care Team: Shirline Frees, NP as PCP - General (Family Medicine) Wendall Stade, MD as PCP - Cardiology (Cardiology) [place], Milas Kocher, [facility] (Pharmacist)  Indicate any recent Medical Services you may have received from other than [facility] providers in the past year (date may be approximate).     Assessment:   This is a routine wellness examination for [name].  Hearing/Vision screen Hearing Screening - Comments:: Denies hearing difficulties   Vision Screening - Comments:: Wears rx glasses - up to date with routine eye exams with  Dr Nile Riggs   Goals Addressed               This Visit's Progress     Continue to  stay active (pt-stated)         Depression Screen    02/22/2024    1:25 PM 05/06/2023   11:45 AM 02/11/2023   11:12 AM 01/23/2022    1:14 PM 01/09/2021   10:40 AM 09/20/2020   11:12 AM 09/20/2019   10:43 AM  PHQ 2/9 Scores  PHQ - 2 Score 0 0 0 0 0 0 2  PHQ- 9 Score     0  2    Fall Risk    02/22/2024    1:27 PM 02/11/2023   11:34 AM 01/23/2022    1:16 PM 01/09/2021   10:38 AM 09/20/2020   11:12 AM  Fall Risk   Falls in the past year? 0 1 0 1 0  Number falls in past yr: 0 0 0 0   Injury with Fall? 0 0 0 0   Comment  No injury or Medical attention needed     Risk for fall due to : No Fall Risks No Fall Risks  Impaired balance/gait   Follow up Falls prevention discussed;Falls evaluation completed Falls prevention discussed  Falls evaluation completed;Falls prevention discussed     MEDICARE RISK AT HOME: Medicare Risk at Home Any stairs in or around the home?: No If so, are there any without handrails?: No Home free of loose throw rugs in walkways, pet beds, electrical cords, etc?: Yes Adequate lighting in your home to reduce risk of falls?: Yes Life alert?: No Use of a cane, walker or w/c?: No Grab bars in the bathroom?: No Shower chair or bench in shower?: Yes Elevated toilet seat or a handicapped toilet?: No  TIMED UP AND GO:  Was the test performed?  Yes  Length of time to ambulate 10 feet: 10 sec Gait steady and fast without use of assistive  device    Cognitive Function:        02/22/2024    1:28 PM 02/11/2023   11:36 AM 01/23/2022    1:18 PM  6CIT Screen  What Year? 0 points 0 points 0 points  What month? 0 points 0 points 0 points  What time? 0 points 0 points 0 points  Count back from 20 2 points 2 points 0 points  Months in reverse 2 points 2 points 2 points  Repeat phrase 10 points 10 points 2 points  Total Score 14 points 14 points 4 points    Immunizations Immunization History  Administered Date(s) Administered   Fluad Quad(high Dose 65+) 09/20/2019,  09/20/2020, 09/30/2022   Fluad Trivalent(High Dose 65+) 10/08/2023   Influenza Split 10/01/2011, 10/11/2012   Influenza Whole 09/30/2008, 09/27/2009, 09/25/2010   Influenza, High Dose Seasonal PF 09/27/2015, 09/12/2016, 09/24/2017, 09/09/2018   Influenza,inj,Quad PF,6+ Mos 09/14/2013, 09/27/2014, 10/16/2014, 09/24/2021   MODERNA COVID-19 SARS-COV-2 PEDS BIVALENT BOOSTER 13yr-22yr 11/14/2021   Moderna Sars-Covid-2 Vaccination 01/10/2020, 02/20/2020   Pneumococcal Conjugate-13 03/21/2015   Pneumococcal Polysaccharide-23 08/16/2014   Tdap 08/13/2014    TDAP status: Up to date  Flu Vaccine status: Up to date  Pneumococcal vaccine status: Up to date  Covid-19 vaccine status: Declined, Education has been provided regarding the importance of this vaccine but patient still declined. Advised may receive this vaccine at local pharmacy or Health Dept.or vaccine clinic. Aware to provide a copy of the vaccination record if obtained from local pharmacy or Health Dept. Verbalized acceptance and understanding.  Qualifies for Shingles Vaccine? Yes   Zostavax completed No   Shingrix Completed?: No.    Education has been provided regarding the importance of this vaccine. Patient has been advised to call insurance company to determine out of pocket expense if they have not yet received this vaccine. Advised may also receive vaccine at local pharmacy or Health Dept. Verbalized acceptance and understanding.  Screening Tests Health Maintenance  Topic Date Due   Zoster Vaccines- Shingrix (1 of 2) Never done   COVID-19 Vaccine (4 - 2024-25 season) 08/30/2023   DTaP/Tdap/Td (2 - Td or Tdap) 08/13/2024   Medicare Annual Wellness (AWV)  02/21/2025   Pneumonia Vaccine 85+ Years old  Completed   INFLUENZA VACCINE  Completed   DEXA SCAN  Completed   HPV VACCINES  Aged Out   FOOT EXAM  Discontinued   HEMOGLOBIN A1C  Discontinued   OPHTHALMOLOGY EXAM  Discontinued    Health Maintenance  Health Maintenance  Due  Topic Date Due   Zoster Vaccines- Shingrix (1 of 2) Never done   COVID-19 Vaccine (4 - 2024-25 season) 08/30/2023    Bone Density status: Completed 05/16/15. Results reflect: Bone density results: OSTEOPOROSIS. Repeat every   years.    Additional Screening:    Vision Screening: Recommended annual ophthalmology exams for early detection of glaucoma and other disorders of the eye. Is the patient up to date with their annual eye exam?  Yes  Who is the provider or what is the name of the office in which the patient attends annual eye exams? Dr Nile Riggs If pt is not established with a provider, would they like to be referred to a provider to establish care? No .   Dental Screening: Recommended annual dental exams for proper oral hygiene   Community Resource Referral / Chronic Care Management:  CRR required this visit?  No   CCM required this visit?  No     Plan:  I have personally reviewed and noted the following in the patient's chart:   Medical and social history Use of alcohol, tobacco or illicit drugs  Current medications and supplements including opioid prescriptions. Patient is not currently taking opioid prescriptions. Functional ability and status Nutritional status Physical activity Advanced directives List of other physicians Hospitalizations, surgeries, and ER visits in previous 12 months Vitals Screenings to include cognitive, depression, and falls Referrals and appointments  In addition, I have reviewed and discussed with patient certain preventive protocols, quality metrics, and best practice recommendations. A written personalized care plan for preventive services as well as general preventive health recommendations were provided to patient.     Tillie Rung, LPN   [id number]/61/0960   After Visit Summary: (In Person-Printed) AVS printed and given to the patient  Nurse Notes: None

## 2024-02-22 NOTE — Patient Instructions (Addendum)
 [name] , Thank you for taking time to come for your Medicare Wellness Visit. I appreciate your ongoing commitment to your health goals. Please review the following plan we discussed and let me know if I can assist you in the future.   Referrals/Orders/Follow-Ups/Clinician Recommendations: Continue to stay active.  This is a list of the screening recommended for you and due dates:  Health Maintenance  Topic Date Due   Zoster (Shingles) Vaccine (1 of 2) Never done   COVID-19 Vaccine (4 - 2024-25 season) 08/30/2023   DTaP/Tdap/Td vaccine (2 - Td or Tdap) 08/13/2024   Medicare Annual Wellness Visit  02/21/2025   Pneumonia Vaccine  Completed   Flu Shot  Completed   DEXA scan (bone density measurement)  Completed   HPV Vaccine  Aged Out   Complete foot exam   Discontinued   Hemoglobin A1C  Discontinued   Eye exam for diabetics  Discontinued    Advanced directives: (Declined) Advance directive discussed with you today. Even though you declined this today, please call our office should you change your mind, and we can give you the proper paperwork for you to fill out.  Next Medicare Annual Wellness Visit scheduled for next year: Yes

## 2024-03-07 ENCOUNTER — Ambulatory Visit
Admission: EM | Admit: 2024-03-07 | Discharge: 2024-03-07 | Disposition: A | Discharge status: Home / Self Care | Attending: Nurse Practitioner | Admitting: Nurse Practitioner

## 2024-03-07 DIAGNOSIS — N3000 Acute cystitis without hematuria: Secondary | ICD-10-CM | POA: Diagnosis not present

## 2024-03-07 DIAGNOSIS — R197 Diarrhea, unspecified: Secondary | ICD-10-CM | POA: Insufficient documentation

## 2024-03-07 LAB — POCT URINALYSIS DIP (MANUAL ENTRY)
Bilirubin, UA: NEGATIVE
Glucose, UA: NEGATIVE mg/dL
Nitrite, UA: POSITIVE — AB
Protein Ur, POC: 100 mg/dL — AB
Spec Grav, UA: 1.025
Urobilinogen, UA: 0.2 U/dL
pH, UA: 6

## 2024-03-07 MED ORDER — CEPHALEXIN 500 MG PO CAPS: 500.0000 mg | ORAL_CAPSULE | Freq: Four times a day (QID) | ORAL | 0 refills | Status: AC

## 2024-03-07 NOTE — ED Triage Notes (Signed)
 Diarrhea, lower abdominal pain, x 4 days. Taking ibuprofen.

## 2024-03-07 NOTE — Discharge Instructions (Addendum)
 A urine culture has been ordered.  You will be contacted if the results of the culture show that the medication prescribed today needs to be changed.  You also have access to the results via MyChart. Take medication as prescribed. Stop taking ibuprofen.  Please switch to Tylenol to help protect your kidneys.  You may take Tylenol as needed for pain, fever, or general discomfort. Make sure you are drinking plenty of fluids.  Try to drink at least 5-7 eight ounce glasses of water daily. Make sure you are urinating every 2 hours. Avoid caffeine such as tea, soda, or coffee while symptoms persist. Go to the emergency department immediately if you develop fever, chills, worsening abdominal pain, become confused, or develop pain around your kidneys. Please follow-up with your primary care physician next week for reevaluation. Follow-up as needed.

## 2024-03-07 NOTE — ED Provider Notes (Signed)
 [facility]    CSN: [id number] Arrival date & time: 03/07/24  1409      History   Chief Complaint Chief Complaint  Patient presents with   Diarrhea    HPI Goldie Kayda Allers is a 88 y.o. female.   The history is provided by the patient.   Patient presents for complaints of lower abdominal pain and diarrhea for the past 4 days.  She denies fever, chills, chest pain, nausea, vomiting, diarrhea, urinary frequency, urgency, hesitancy, dysuria, or decreased urine stream.  Patient reports she has had 3 episodes of diarrhea today, states this is what she has been having for the past several days.  She states prior to her symptoms starting, she had spam with cheese on it, symptoms started shortly thereafter.  Patient reports prior history of constipation, states that she did have constipation last week.  Patient does report history of reflux, endorses belching.  She states she has been taking ibuprofen for her symptoms.  She currently does not take any medication for her reflux.  Past Medical History:  Diagnosis Date   Anxiety state    unspecified   ARTHRITIS 09/03/2010   Asthma    CHOLELITHIASIS 09/03/2010   COLONIC POLYPS, HX OF 06/02/2008   DIVERTICULAR DISEASE 09/03/2010   DYSPHAGIA UNSPECIFIED 09/03/2010   ESSENTIAL HYPERTENSION 10/29/2010   Fibromyalgia    GALLSTONE PANCREATITIS 09/03/2010   GERD 09/02/2007   HEART MURMUR, HX OF 08/12/2010   Hematuria    Hiatal hernia    Hx of colonoscopy    2014   HYPERLIPIDEMIA 12/09/2007   Insomnia    Lichen simplex chronicus    Low back pain    chronic   Osteopenia    OSTEOPOROSIS 05/17/2009   Palpitations 09/03/2010   event monitor reveals long RP tachycardia, likely sinus tachycardia   Paresthesia    Polyuria    RBBB 09/05/2010   VENTRICULAR HYPERTROPHY, LEFT 08/12/2010   VITAMIN B12 DEFICIENCY 09/02/2007   WEIGHT LOSS, ABNORMAL 06/02/2008    Patient Active Problem List   Diagnosis Date Noted   SVT (supraventricular tachycardia)  (HCC) 03/13/2019   Impaired glucose tolerance 11/26/2015   Lung nodule 06/22/2015   Thyroid nodule, uninodular, isthmus 08/01/2014   Bifascicular block 05/12/2011   Hiatal hernia    Essential hypertension 10/29/2010   RBBB 09/05/2010   DIVERTICULAR DISEASE 09/03/2010   VENTRICULAR HYPERTROPHY, LEFT 08/12/2010   Insomnia 08/12/2010   HEART MURMUR, HX OF 08/12/2010   LICHEN SIMPLEX CHRONICUS 01/17/2010   Osteoporosis 05/17/2009   Generalized anxiety disorder 08/30/2008   History of colonic polyps 06/02/2008   Hyperlipidemia 12/09/2007   LOW BACK PAIN 12/09/2007   B12 deficiency 09/02/2007   GERD 09/02/2007   Asthma 08/10/2007    Past Surgical History:  Procedure Laterality Date   ABDOMINAL HYSTERECTOMY     THA and O   APPENDECTOMY     CHOLECYSTECTOMY     ESOPHAGOGASTRODUODENOSCOPY     with HH and esophagitis   OOPHORECTOMY     SPINE SURGERY     cervical fusion    OB History   No obstetric history on file.      Home Medications    Prior to Admission medications   Medication Sig Start Date End Date Taking? Authorizing Provider  acetaminophen (TYLENOL) 650 MG CR tablet Take 650 mg by mouth every 8 (eight) hours as needed for pain (arthritis).   Yes [provider]  alendronate (FOSAMAX) 70 MG/75ML solution Take 70 mg by mouth  every 7 (seven) days. Take with a full glass of water on an empty stomach.   Yes [provider]  ALPRAZolam Prudy Feeler) 1 MG tablet Take 1 tablet (1 mg total) by mouth 2 (two) times daily as needed for anxiety. 02/11/24 03/12/24 Yes Nafziger, Kandee Keen, NP  cephALEXin (KEFLEX) 500 MG capsule Take 1 capsule (500 mg total) by mouth 4 (four) times daily. 03/07/24  Yes Leath-Warren, Sadie Haber, NP  citalopram (CELEXA) 20 MG tablet Take 1 tablet by mouth once daily 01/06/24  Yes Nafziger, Kandee Keen, NP  meloxicam (MOBIC) 15 MG tablet Take 1 tablet (15 mg total) by mouth daily. 02/11/24  Yes Nafziger, Kandee Keen, NP  metoprolol tartrate (LOPRESSOR) 25 MG tablet  Take 1 tablet (25 mg total) by mouth 2 (two) times daily. 04/20/23  Yes Wendall Stade, MD  Ascorbic Acid (VITAMIN C) 500 MG CAPS Take 500 mg by mouth every other day.    [provider]  Calcium Citrate-Vitamin D (CALCIUM CITRATE +D PO) Take 1 tablet by mouth daily.    [provider]  cholecalciferol (VITAMIN D3) 25 MCG (1000 UNIT) tablet Take 1,000 Units by mouth daily.    [provider]  Cyanocobalamin (VITAMIN B 12 PO) Take 100 mcg by mouth daily.    [provider]  fluticasone (FLONASE) 50 MCG/ACT nasal spray Place 1 spray into both nostrils 2 (two) times daily. Patient taking differently: Place 1 spray into both nostrils 2 (two) times daily as needed. 12/28/21   Particia Nearing, PA-C  magnesium hydroxide (MILK OF MAGNESIA) 400 MG/5ML suspension Take 5 mLs by mouth daily as needed for mild constipation.    [provider]  Multiple Vitamin (MULTIVITAMIN) tablet Take 1 tablet by mouth daily.    [provider]  POTASSIUM GLUCONATE PO Take 2 tablets by mouth.    [provider]  zinc gluconate 50 MG tablet Take 50 mg by mouth daily.    [provider]    Family History Family History  Problem Relation Age of Onset   Heart disease Mother    Stroke Mother    Prostate cancer Father     Social History Social History   Tobacco Use   Smoking status: Never   Smokeless tobacco: Never  Vaping Use   Vaping status: Never Used  Substance Use Topics   Alcohol use: No    Alcohol/week: 0.0 standard drinks of alcohol   Drug use: No     Allergies   Celebrex [celecoxib]   Review of Systems Review of Systems Per HPI  Physical Exam Triage Vital Signs ED Triage Vitals  Encounter Vitals Group     BP 03/07/24 1612 128/77     Systolic BP Percentile --      Diastolic BP Percentile --      Pulse Rate 03/07/24 1612 89     Resp 03/07/24 1612 18     Temp 03/07/24 1612 99 F (37.2 C)     Temp Source 03/07/24  1612 Oral     SpO2 03/07/24 1612 93 %     Weight --      Height --      Head Circumference --      Peak Flow --      Pain Score 03/07/24 1613 0     Pain Loc --      Pain Education --      Exclude from Growth Chart --    No data found.  Updated Vital Signs BP  128/77 (BP Location: Right Arm)   Pulse 89   Temp 99 F (37.2 C) (Oral)   Resp 18   SpO2 93%   Visual Acuity Right Eye Distance:   Left Eye Distance:   Bilateral Distance:    Right Eye Near:   Left Eye Near:    Bilateral Near:     Physical Exam Vitals and nursing note reviewed.  Constitutional:      General: She is not in acute distress.    Appearance: Normal appearance.  HENT:     Head: Normocephalic.  Eyes:     Extraocular Movements: Extraocular movements intact.     Conjunctiva/sclera: Conjunctivae normal.     Pupils: Pupils are equal, round, and reactive to light.  Cardiovascular:     Rate and Rhythm: Regular rhythm.     Pulses: Normal pulses.     Heart sounds: Normal heart sounds.  Pulmonary:     Effort: Pulmonary effort is normal. No respiratory distress.     Breath sounds: Normal breath sounds. No stridor. No wheezing, rhonchi or rales.  Abdominal:     General: Bowel sounds are normal.     Palpations: Abdomen is soft.     Tenderness: There is no abdominal tenderness. There is no right CVA tenderness or left CVA tenderness.  Musculoskeletal:     Cervical back: Normal range of motion.  Lymphadenopathy:     Cervical: No cervical adenopathy.  Skin:    General: Skin is warm and dry.  Neurological:     General: No focal deficit present.     Mental Status: She is alert and oriented to person, place, and time.  Psychiatric:        Mood and Affect: Mood normal.        Behavior: Behavior normal.     UC Treatments / Results  Labs (all labs ordered are listed, but only abnormal results are displayed) Labs Reviewed  POCT URINALYSIS DIP (MANUAL ENTRY) - Abnormal; Notable for the following  components:      Result Value   Color, UA straw (*)    Clarity, UA turbid (*)    Ketones, POC UA large (80) (*)    Blood, UA moderate (*)    Protein Ur, POC =100 (*)    Nitrite, UA Positive (*)    Leukocytes, UA Small (1+) (*)    All other components within normal limits  URINE CULTURE    EKG   Radiology No results found.  Procedures Procedures (including critical care time)  Medications Ordered in UC Medications - No data to display  Initial Impression / Assessment and Plan / UC Course  I have reviewed the triage vital signs and the nursing notes.  Pertinent labs & imaging results that were available during my care of the patient were reviewed by me and considered in my medical decision making (see chart for details).  Urinalysis was positive for UTI in the presence of nitrites, leukocytes, protein, and RBC.  Urine culture is pending.  Will start Keflex 500 mg 4 times daily for the next 7 days.  Patient advised to begin Pepto-Bismol for diarrhea, patient was also given information regarding foods to eat to help relieve diarrhea symptoms.  Supportive care recommendations were provided and discussed to include increasing fluid intake, over-the-counter Tylenol, avoiding caffeine, and urinating every 2 hours.  Patient was given strict ER follow-up precautions.  Patient was advised to follow-up with her PCP next week for reevaluation.  Patient was in agreement with  this plan of care and verbalized understanding.  All questions were answered.  Patient stable for discharge.     Final Clinical Impressions(s) / UC Diagnoses   Final diagnoses:  Acute cystitis without hematuria  Diarrhea, unspecified type     Discharge Instructions      A urine culture has been ordered.  You will be contacted if the results of the culture show that the medication prescribed today needs to be changed.  You also have access to the results via MyChart. Take medication as prescribed. Stop taking  ibuprofen.  Please switch to Tylenol to help protect your kidneys.  You may take Tylenol as needed for pain, fever, or general discomfort. Make sure you are drinking plenty of fluids.  Try to drink at least 5-7 eight ounce glasses of water daily. Make sure you are urinating every 2 hours. Avoid caffeine such as tea, soda, or coffee while symptoms persist. Go to the emergency department immediately if you develop fever, chills, worsening abdominal pain, become confused, or develop pain around your kidneys. Please follow-up with your primary care physician next week for reevaluation. Follow-up as needed.     ED Prescriptions     Medication Sig Dispense Auth. Provider   cephALEXin (KEFLEX) 500 MG capsule Take 1 capsule (500 mg total) by mouth 4 (four) times daily. 28 capsule Leath-Warren, Sadie Haber, NP      PDMP not reviewed this encounter.   Abran Cantor, NP 03/07/24 1701

## 2024-03-08 ENCOUNTER — Telehealth: Payer: Self-pay

## 2024-03-08 NOTE — Telephone Encounter (Signed)
 Lab called stating that patients UC was not labeled therefore it could not be tested. Called pt and explained to her the mistake that was made. Asked if patient could return to the clinic to have her urine recollected. Pt verbalized understanding and said that she will be returning to the clinic on 3/12.

## 2024-03-09 ENCOUNTER — Ambulatory Visit: Admission: EM | Admit: 2024-03-09 | Source: Home / Self Care

## 2024-03-09 NOTE — ED Notes (Signed)
 Patient here for re-collect for urine culture

## 2024-03-10 LAB — URINE CULTURE: Culture: NO GROWTH

## 2024-04-12 ENCOUNTER — Other Ambulatory Visit: Payer: Self-pay | Admitting: Adult Health

## 2024-04-12 DIAGNOSIS — I471 Supraventricular tachycardia, unspecified: Secondary | ICD-10-CM

## 2024-04-20 ENCOUNTER — Other Ambulatory Visit: Payer: Self-pay | Admitting: Cardiovascular Disease

## 2024-06-01 ENCOUNTER — Other Ambulatory Visit: Payer: Self-pay | Admitting: Adult Health

## 2024-06-01 DIAGNOSIS — F411 Generalized anxiety disorder: Secondary | ICD-10-CM

## 2024-06-02 NOTE — Telephone Encounter (Signed)
 Okay for refill?

## 2024-07-11 ENCOUNTER — Other Ambulatory Visit: Payer: Self-pay | Admitting: Adult Health

## 2024-07-11 DIAGNOSIS — I471 Supraventricular tachycardia, unspecified: Secondary | ICD-10-CM

## 2024-07-18 ENCOUNTER — Other Ambulatory Visit: Payer: Self-pay | Admitting: Adult Health

## 2024-07-18 DIAGNOSIS — Z1231 Encounter for screening mammogram for malignant neoplasm of breast: Secondary | ICD-10-CM

## 2024-07-27 ENCOUNTER — Other Ambulatory Visit: Payer: Self-pay | Admitting: Adult Health

## 2024-07-27 ENCOUNTER — Telehealth: Payer: Self-pay

## 2024-07-27 DIAGNOSIS — R739 Hyperglycemia, unspecified: Secondary | ICD-10-CM

## 2024-07-27 NOTE — Telephone Encounter (Signed)
 Patient was identified as falling into the [place] Measure - Diabetes.   Patient was: Appointment scheduled for lab or office visit for A1c.  Lab appointment scheduled for August 02, 2024 at 11:00am.

## 2024-07-28 NOTE — Addendum Note (Signed)
 Addended by: DIONISIO CAMELIA PARAS on: 07/28/2024 02:57 PM   Modules accepted: Orders

## 2024-08-02 ENCOUNTER — Ambulatory Visit: Payer: Self-pay | Admitting: Adult Health

## 2024-08-02 ENCOUNTER — Other Ambulatory Visit (INDEPENDENT_AMBULATORY_CARE_PROVIDER_SITE_OTHER)

## 2024-08-02 DIAGNOSIS — R739 Hyperglycemia, unspecified: Secondary | ICD-10-CM

## 2024-08-02 LAB — HEMOGLOBIN A1C: Hgb A1c MFr Bld: 6.2 % (ref 4.6–6.5)

## 2024-08-24 ENCOUNTER — Ambulatory Visit
Admission: RE | Admit: 2024-08-24 | Discharge: 2024-08-24 | Disposition: A | Discharge status: Home / Self Care | Source: Ambulatory Visit | Attending: Adult Health | Admitting: Adult Health

## 2024-08-24 DIAGNOSIS — Z1231 Encounter for screening mammogram for malignant neoplasm of breast: Secondary | ICD-10-CM | POA: Diagnosis not present

## 2024-09-29 ENCOUNTER — Other Ambulatory Visit: Payer: Self-pay | Admitting: Adult Health

## 2024-09-29 DIAGNOSIS — F411 Generalized anxiety disorder: Secondary | ICD-10-CM

## 2024-09-29 NOTE — Telephone Encounter (Signed)
 Okay for refill?

## 2024-10-12 ENCOUNTER — Other Ambulatory Visit: Payer: Self-pay | Admitting: Adult Health

## 2024-10-12 DIAGNOSIS — I471 Supraventricular tachycardia, unspecified: Secondary | ICD-10-CM

## 2024-12-12 ENCOUNTER — Other Ambulatory Visit: Payer: Self-pay | Admitting: Adult Health

## 2024-12-12 DIAGNOSIS — M150 Primary generalized (osteo)arthritis: Secondary | ICD-10-CM

## 2025-01-16 ENCOUNTER — Other Ambulatory Visit: Payer: Self-pay | Admitting: Adult Health

## 2025-01-16 DIAGNOSIS — I471 Supraventricular tachycardia, unspecified: Secondary | ICD-10-CM

## 2025-01-16 DIAGNOSIS — F411 Generalized anxiety disorder: Secondary | ICD-10-CM

## 2025-01-18 ENCOUNTER — Other Ambulatory Visit: Payer: Self-pay | Admitting: Adult Health

## 2025-01-18 DIAGNOSIS — I471 Supraventricular tachycardia, unspecified: Secondary | ICD-10-CM

## 2025-01-18 DIAGNOSIS — F411 Generalized anxiety disorder: Secondary | ICD-10-CM

## 2025-01-18 NOTE — Telephone Encounter (Signed)
 Okay for refill?

## 2025-01-18 NOTE — Telephone Encounter (Signed)
 Copied from CRM #[id number]. Topic: Clinical - Medication Refill >> Jan 18, 2025 12:07 PM Mercedes MATSU wrote: Medication:  citalopram  (CELEXA ) 20 MG tablet  ALPRAZolam  (XANAX ) 1 MG tablet  metoprolol  tartrate (LOPRESSOR ) 25 MG tablet  Has the patient contacted their pharmacy? Yes (Agent: If no, request that the patient contact the pharmacy for the refill. If patient does not wish to contact the pharmacy document the reason why and proceed with request.) (Agent: If yes, when and what did the pharmacy advise?)  This is the patient's preferred pharmacy:  [facility] [address], [place] - [address] [place] [address] Phone: [phone] Fax: [phone]  Is this the correct pharmacy for this prescription? Yes If no, delete pharmacy and type the correct one.   Has the prescription been filled recently? Yes  Is the patient out of the medication? Yes  Has the patient been seen for an appointment in the last year OR does the patient have an upcoming appointment? Yes  Can we respond through MyChart? Yes  Agent: Please be advised that Rx refills may take up to 3 business days. We ask that you follow-up with your pharmacy.

## 2025-01-19 MED ORDER — METOPROLOL TARTRATE 25 MG PO TABS: 25.0000 mg | ORAL_TABLET | Freq: Two times a day (BID) | ORAL | 0 refills | Status: AC

## 2025-02-27 ENCOUNTER — Ambulatory Visit: Payer: Medicare Other
